# Patient Record
Sex: Female | Born: 1935 | Race: White | Hispanic: No | Smoking: Never smoker
Health system: Southern US, Community
[De-identification: ages and names within clinical notes are randomized; demographics above are authoritative.]

## PROBLEM LIST (undated history)

## (undated) DIAGNOSIS — R002 Palpitations: Secondary | ICD-10-CM

## (undated) DIAGNOSIS — I480 Paroxysmal atrial fibrillation: Secondary | ICD-10-CM

## (undated) DIAGNOSIS — I4891 Unspecified atrial fibrillation: Secondary | ICD-10-CM

## (undated) DIAGNOSIS — K449 Diaphragmatic hernia without obstruction or gangrene: Secondary | ICD-10-CM

## (undated) DIAGNOSIS — Z9289 Personal history of other medical treatment: Secondary | ICD-10-CM

## (undated) DIAGNOSIS — D473 Essential (hemorrhagic) thrombocythemia: Secondary | ICD-10-CM

## (undated) DIAGNOSIS — G4700 Insomnia, unspecified: Secondary | ICD-10-CM

## (undated) DIAGNOSIS — Z8679 Personal history of other diseases of the circulatory system: Secondary | ICD-10-CM

## (undated) DIAGNOSIS — I10 Essential (primary) hypertension: Secondary | ICD-10-CM

## (undated) DIAGNOSIS — Z8742 Personal history of other diseases of the female genital tract: Secondary | ICD-10-CM

## (undated) DIAGNOSIS — D75839 Thrombocytosis, unspecified: Secondary | ICD-10-CM

## (undated) DIAGNOSIS — K649 Unspecified hemorrhoids: Secondary | ICD-10-CM

## (undated) DIAGNOSIS — D496 Neoplasm of unspecified behavior of brain: Secondary | ICD-10-CM

## (undated) HISTORY — DX: Unspecified atrial fibrillation: I48.91

## (undated) HISTORY — PX: CATARACT EXTRACTION: SUR2

## (undated) HISTORY — DX: Thrombocytosis, unspecified: D75.839

## (undated) HISTORY — DX: Personal history of other diseases of the circulatory system: Z86.79

## (undated) HISTORY — DX: Personal history of other diseases of the female genital tract: Z87.42

## (undated) HISTORY — DX: Diaphragmatic hernia without obstruction or gangrene: K44.9

## (undated) HISTORY — DX: Insomnia, unspecified: G47.00

## (undated) HISTORY — DX: Palpitations: R00.2

## (undated) HISTORY — DX: Neoplasm of unspecified behavior of brain: D49.6

## (undated) HISTORY — DX: Essential (hemorrhagic) thrombocythemia: D47.3

## (undated) HISTORY — DX: Unspecified hemorrhoids: K64.9

## (undated) HISTORY — DX: Essential (primary) hypertension: I10

---
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
## 1968-12-14 HISTORY — PX: OTHER SURGICAL HISTORY: SHX169

## 2006-05-16 ENCOUNTER — Other Ambulatory Visit: Payer: Self-pay

## 2006-05-16 ENCOUNTER — Emergency Department: Payer: Self-pay | Admitting: Emergency Medicine

## 2007-02-01 ENCOUNTER — Ambulatory Visit: Payer: Self-pay | Admitting: Family Medicine

## 2008-07-17 ENCOUNTER — Ambulatory Visit: Payer: Self-pay | Admitting: Family Medicine

## 2008-08-09 ENCOUNTER — Ambulatory Visit: Payer: Self-pay | Admitting: Unknown Physician Specialty

## 2008-08-28 ENCOUNTER — Ambulatory Visit: Payer: Self-pay | Admitting: Family Medicine

## 2009-04-02 ENCOUNTER — Ambulatory Visit: Payer: Self-pay | Admitting: Ophthalmology

## 2009-04-15 ENCOUNTER — Ambulatory Visit: Payer: Self-pay | Admitting: Ophthalmology

## 2011-10-07 ENCOUNTER — Ambulatory Visit: Payer: Self-pay | Admitting: Ophthalmology

## 2011-10-13 ENCOUNTER — Ambulatory Visit: Payer: Self-pay | Admitting: Ophthalmology

## 2012-03-08 ENCOUNTER — Encounter: Payer: Self-pay | Admitting: *Deleted

## 2012-03-08 ENCOUNTER — Ambulatory Visit (INDEPENDENT_AMBULATORY_CARE_PROVIDER_SITE_OTHER): Payer: Medicare Other | Admitting: Cardiovascular Disease

## 2012-03-08 VITALS — BP 129/78 | HR 76 | Ht 64.5 in | Wt 141.0 lb

## 2012-03-08 DIAGNOSIS — I471 Supraventricular tachycardia: Secondary | ICD-10-CM

## 2012-03-08 DIAGNOSIS — I10 Essential (primary) hypertension: Secondary | ICD-10-CM | POA: Insufficient documentation

## 2012-03-08 DIAGNOSIS — I4949 Other premature depolarization: Secondary | ICD-10-CM

## 2012-03-08 DIAGNOSIS — I493 Ventricular premature depolarization: Secondary | ICD-10-CM | POA: Insufficient documentation

## 2012-03-08 MED ORDER — NEBIVOLOL HCL 5 MG PO TABS: 5.0000 mg | ORAL_TABLET | Freq: Every day | ORAL | Status: DC

## 2012-03-08 NOTE — Assessment & Plan Note (Signed)
Blood pressure is well-controlled on today's visit. We will start bystolic for palpitations and SVT.

## 2012-03-08 NOTE — Progress Notes (Signed)
Patient ID: Shelly Padilla, female    DOB: Mar 29, 1920, 78 y.o.   MRN: [id number]  HPI Comments: 78 year old female with history of hypertension , insomnia , SVT, APCs and PVCs that are symptomatic with previous trial of diltiazem, metoprolol and more recently, amiodarone who presents for second opinion.  She reports that diltiazem and metoprolol did not seem to work for her. She was started on a meal around 100 mg twice a day with improvement of her palpitations. She read the side effect profile and started having problems with her eyes, hearing, memory as well as weight loss. She attributed everything to the medication. This was decreased to 100 mg daily and eventually 100 mg every other day. Symptoms persisted and she was told to stop the medication. She now has recurrent symptoms, particularly in the nighttime. She is wondering if there are other medications she can try.   She takes care of her husband who has Alzheimer's. There is significant stress. She does not exercise like she should. She has had weight loss which she attributes to taking care of him and eating less. Otherwise she has no other complaints. She denies any shortness of breath or chest pain Holter monitor November 2011 showed paroxysmal SVT.  EKG shows normal sinus rhythm with right bundle branch block, left anterior fascicular block, rate 77 beats per minute    Outpatient Encounter Prescriptions as of 03/08/2012  Medication Sig Dispense Refill  . cholecalciferol (VITAMIN D) 400 UNITS TABS Take 400 Units by mouth daily.      . Multiple Minerals-Vitamins (CALCIUM-MAGNESIUM-ZINC) TABS Take by mouth daily.      . Multiple Vitamins-Minerals (CENTRUM SILVER PO) Take by mouth daily.      . ramipril (ALTACE) 5 MG capsule Take 1 tablet by mouth Daily.      . timolol (TIMOPTIC) 0.5 % ophthalmic solution as directed.      . zolpidem (AMBIEN) 5 MG tablet At bedtime as needed.      . nebivolol (BYSTOLIC) 5 MG tablet Take 1 tablet (5 mg  total) by mouth daily.  14 tablet  0    Review of Systems  Constitutional: Negative.   HENT: Negative.   Eyes: Negative.   Respiratory: Negative.   Cardiovascular: Negative.   Gastrointestinal: Negative.   Musculoskeletal: Negative.   Skin: Negative.   Neurological: Negative.   Hematological: Negative.   Psychiatric/Behavioral: Negative.   All other systems reviewed and are negative.    BP 129/78  Pulse 76  Ht 5' 4.5" (1.638 m)  Wt 141 lb (63.957 kg)  BMI 23.83 kg/m2  Physical Exam  Nursing note and vitals reviewed. Constitutional: She is oriented to person, place, and time. She appears well-developed and well-nourished.  HENT:  Head: Normocephalic.  Nose: Nose normal.  Mouth/Throat: Oropharynx is clear and moist.  Eyes: Conjunctivae are normal. Pupils are equal, round, and reactive to light.  Neck: Normal range of motion. Neck supple. No JVD present.  Cardiovascular: Normal rate, regular rhythm, S1 normal, S2 normal, normal heart sounds and intact distal pulses.  Exam reveals no gallop and no friction rub.   No murmur heard. Pulmonary/Chest: Effort normal and breath sounds normal. No respiratory distress. She has no wheezes. She has no rales. She exhibits no tenderness.  Abdominal: Soft. Bowel sounds are normal. She exhibits no distension. There is no tenderness.  Musculoskeletal: Normal range of motion. She exhibits no edema and no tenderness.  Lymphadenopathy:    She has no cervical adenopathy.  Neurological: She  is alert and oriented to person, place, and time. Coordination normal.  Skin: Skin is warm and dry. No rash noted. No erythema.  Psychiatric: She has a normal mood and affect. Her behavior is normal. Judgment and thought content normal.         Assessment and Plan

## 2012-03-08 NOTE — Assessment & Plan Note (Signed)
Previous EKG did suggest she had APCs and PVCs. Hopefully we can control these as well medical management

## 2012-03-08 NOTE — Assessment & Plan Note (Signed)
History of SVT. Now with symptoms in the evenings. Currently not taking any antiarrhythmic medication. She has tried calcium channel blocker, metoprolol, amiodarone. We will try to pick a medication with minimal side effect profile and will start bystolic 5 mg in the evening at dinner has most of her symptoms are when she goes to bed. We have given her a coupon for two-week trial and asked her to call us in the office if she would like to continue this medication when he samples were almost done. The dose could be titrated upwards to 10 mg if needed.

## 2012-03-08 NOTE — Patient Instructions (Signed)
You are doing well. Please take bystolic 5 mg at dinner for fast rhythm.   Please call us if you have new issues that need to be addressed before your next appt.  Your physician wants you to follow-up in: 1 months.  You will receive a reminder letter in the mail two months in advance. If you don't receive a letter, please call our office to schedule the follow-up appointment.

## 2012-04-11 ENCOUNTER — Telehealth: Payer: Self-pay | Admitting: Cardiovascular Disease

## 2012-04-11 NOTE — Telephone Encounter (Signed)
Pt calling states that she has been experiencing some chest pressure that comes and goes throughout the day and last night seemed stronger. Pt is concerned that it may be here bystolic since she states that is a side effect.

## 2012-04-11 NOTE — Telephone Encounter (Signed)
Uncertain if bystolic is causing her symptoms but we certainly could try taking the medication at a different time of the day, or she could try cutting the dose in half. She may do better with a half twice a day. She actually does want a low heart rate to avoid having her palpitations and ectopy. She could try a half dose, if she feels well, stay on the half. If she starts to have palpitations again, she could try a half dose twice a day.

## 2012-04-11 NOTE — Telephone Encounter (Signed)
Patient called, stated she has been having heaviness in chest off and on for 1 to 2 weeks.States has noticed since starting on bystolic 5mg  daily. States had a sharp pain in chest last night that concerned her. States pain last 1 min or less.Also feels tired and heart rate is ranging 55 to 60 beats/min.Patient wanting to know if she can decrease bystolic.Fowarded to [employer] for advice.

## 2012-04-12 ENCOUNTER — Telehealth: Payer: Self-pay | Admitting: *Deleted

## 2012-04-12 NOTE — Telephone Encounter (Signed)
Spoke with pt who states she thinks 1/2 tab of bystolic may do the trick--she will start today and has an appoint with dr Mariah Milling next week--in meantime if any problems, please call--pt agrees--nt

## 2012-04-19 ENCOUNTER — Encounter: Payer: Self-pay | Admitting: Cardiovascular Disease

## 2012-04-19 ENCOUNTER — Ambulatory Visit (INDEPENDENT_AMBULATORY_CARE_PROVIDER_SITE_OTHER): Payer: Medicare Other | Admitting: Cardiovascular Disease

## 2012-04-19 VITALS — BP 148/68 | HR 71 | Ht 64.0 in | Wt 142.5 lb

## 2012-04-19 DIAGNOSIS — I4949 Other premature depolarization: Secondary | ICD-10-CM

## 2012-04-19 DIAGNOSIS — I10 Essential (primary) hypertension: Secondary | ICD-10-CM

## 2012-04-19 DIAGNOSIS — I493 Ventricular premature depolarization: Secondary | ICD-10-CM

## 2012-04-19 NOTE — Assessment & Plan Note (Signed)
We have asked her to monitor her blood pressure at home. It is borderline elevated today.

## 2012-04-19 NOTE — Assessment & Plan Note (Signed)
She reports feeling better on low-dose bystolic. She would like to take this as needed only. We have suggested she could try this and if symptoms come back, she could take this on a regular basis. She can try 2.5 up to 5 mg as needed.

## 2012-04-19 NOTE — Progress Notes (Signed)
Patient ID: Shelly Padilla, female    DOB: 04-07-1920, 78 y.o.   MRN: [id number]  HPI Comments: 78 year old female with history of hypertension , insomnia , SVT, APCs and PVCs that are symptomatic with previous trial of diltiazem, metoprolol and more recently, amiodarone who presents for second opinion.   diltiazem and metoprolol did not seem to work for her.   She takes care of her husband who has Alzheimer's. There is significant stress. She does not exercise like she should. On her last clinic visit we started low dose bystolic. The 5 mg she felt was too much and cause bradycardia. She has cut the dose in half and takes his other regular basis. She denies any regular tachycardia or palpitations on the bystolic. She would like to take the medication as needed Otherwise she has no other complaints. She denies any shortness of breath or chest pain  Holter monitor November 2011 showed paroxysmal SVT.    Outpatient Encounter Prescriptions as of 04/19/2012  Medication Sig Dispense Refill  . aspirin 81 MG tablet Take 81 mg by mouth daily.      . cholecalciferol (VITAMIN D) 400 UNITS TABS Take 400 Units by mouth daily.      . Multiple Minerals-Vitamins (CALCIUM-MAGNESIUM-ZINC) TABS Take by mouth daily.      . Multiple Vitamins-Minerals (CENTRUM SILVER PO) Take by mouth daily.      . nebivolol (BYSTOLIC) 2.5 MG tablet Take 2.5 mg by mouth daily.      . ramipril (ALTACE) 5 MG capsule Take 1 tablet by mouth Daily.      . timolol (TIMOPTIC) 0.5 % ophthalmic solution as directed.      . zolpidem (AMBIEN) 5 MG tablet At bedtime as needed.        Review of Systems  Constitutional: Negative.   HENT: Negative.   Eyes: Negative.   Respiratory: Negative.   Cardiovascular: Negative.   Gastrointestinal: Negative.   Musculoskeletal: Negative.   Skin: Negative.   Neurological: Negative.   Hematological: Negative.   Psychiatric/Behavioral: Negative.   All other systems reviewed and are  negative.    BP 148/68  Pulse 71  Ht 5\' 4"  (1.626 m)  Wt 142 lb 8 oz (64.638 kg)  BMI 24.46 kg/m2  Physical Exam  Nursing note and vitals reviewed. Constitutional: She is oriented to person, place, and time. She appears well-developed and well-nourished.  HENT:  Head: Normocephalic.  Nose: Nose normal.  Mouth/Throat: Oropharynx is clear and moist.  Eyes: Conjunctivae are normal. Pupils are equal, round, and reactive to light.  Neck: Normal range of motion. Neck supple. No JVD present.  Cardiovascular: Normal rate, regular rhythm, S1 normal, S2 normal, normal heart sounds and intact distal pulses.  Exam reveals no gallop and no friction rub.   No murmur heard. Pulmonary/Chest: Effort normal and breath sounds normal. No respiratory distress. She has no wheezes. She has no rales. She exhibits no tenderness.  Abdominal: Soft. Bowel sounds are normal. She exhibits no distension. There is no tenderness.  Musculoskeletal: Normal range of motion. She exhibits no edema and no tenderness.  Lymphadenopathy:    She has no cervical adenopathy.  Neurological: She is alert and oriented to person, place, and time. Coordination normal.  Skin: Skin is warm and dry. No rash noted. No erythema.  Psychiatric: She has a normal mood and affect. Her behavior is normal. Judgment and thought content normal.         Assessment and Plan

## 2012-04-19 NOTE — Patient Instructions (Addendum)
You are doing well. Ok to take bystolic 1/2 pill (2.5 mg) as needed for tachycardia or palpitations  Please call us if you have new issues that need to be addressed before your next appt.  Your physician wants you to follow-up in: 12 months.  You will receive a reminder letter in the mail two months in advance. If you don't receive a letter, please call our office to schedule the follow-up appointment.

## 2012-09-02 ENCOUNTER — Telehealth: Payer: Self-pay | Admitting: Cardiovascular Disease

## 2012-09-02 NOTE — Telephone Encounter (Signed)
Pt called to make and appt states that she is not sure if her bystolic is the right medication for her. She says that when she takes it and lays down that it makes her heart POUND but she is fine when she is standing.

## 2012-09-02 NOTE — Telephone Encounter (Signed)
FYI

## 2012-09-02 NOTE — Telephone Encounter (Signed)
Pt called to schedule an appt and states that she thinks her bystolic is not the medication for her. She says that when she takes it and lays down that it makes her heart POUND but she is fine when she is standing.

## 2012-09-02 NOTE — Telephone Encounter (Signed)
Pt asks if ok to continue taking just 1/2 tablet bystolic PRN tachycardia/palpitations. I advised, per Dr. Windell Hummingbird ;ast note, ok to take it this way. She reassures me this is controlling her palpitations/tachycardia well.  She mentions a change in her eyesight over the past few weeks and wonders if this could be attributed to the Bystolic. I reassured her this usually is not a SE and should f/u with PCP. Understanding verb. Says BP has been well controlled as well (130/70). Will keep appt with Dr. Mariah Milling for November and will call us should she need to be seen sooner.

## 2012-10-18 ENCOUNTER — Encounter: Payer: Self-pay | Admitting: Cardiovascular Disease

## 2012-10-18 ENCOUNTER — Ambulatory Visit (INDEPENDENT_AMBULATORY_CARE_PROVIDER_SITE_OTHER): Payer: Medicare Other | Admitting: Cardiovascular Disease

## 2012-10-18 VITALS — BP 152/80 | HR 75 | Ht 64.5 in | Wt 139.5 lb

## 2012-10-18 DIAGNOSIS — F39 Unspecified mood [affective] disorder: Secondary | ICD-10-CM | POA: Insufficient documentation

## 2012-10-18 DIAGNOSIS — R002 Palpitations: Secondary | ICD-10-CM

## 2012-10-18 DIAGNOSIS — I10 Essential (primary) hypertension: Secondary | ICD-10-CM

## 2012-10-18 DIAGNOSIS — R0602 Shortness of breath: Secondary | ICD-10-CM

## 2012-10-18 DIAGNOSIS — I471 Supraventricular tachycardia: Secondary | ICD-10-CM

## 2012-10-18 DIAGNOSIS — F4320 Adjustment disorder, unspecified: Secondary | ICD-10-CM

## 2012-10-18 NOTE — Progress Notes (Signed)
Patient ID: Shelly Padilla, female    DOB: 01-01-1920, 78 y.o.   MRN: [id number]  HPI Comments: 78 year old female with history of hypertension , insomnia , SVT, APCs and PVCs that are symptomatic with previous trial of diltiazem, metoprolol and more recently, amiodarone who presents for routine followup.   diltiazem and metoprolol did not seem to work for her.   She takes care of her husband who has Alzheimer's. There is significant stress.  She reports that she is taking bystolic  sporadically and continues to have occasional palpitations.  overall she feels well with no significant complaints. She did have a tooth pulled recently and since she started oxycodone for pain relief, she has had vision and hearing problems.  She denies any shortness of breath or chest pain   Holter monitor November 2011 showed paroxysmal SVT.  EKG today shows normal sinus rhythm with rate 75 beats per minute, right bundle branch block, left axis deviation    Outpatient Encounter Prescriptions as of 10/18/2012  Medication Sig Dispense Refill  . aspirin 81 MG tablet Take 81 mg by mouth daily.      . cholecalciferol (VITAMIN D) 400 UNITS TABS Take 400 Units by mouth daily.      . Multiple Minerals-Vitamins (CALCIUM-MAGNESIUM-ZINC) TABS Take by mouth daily.      . Multiple Vitamins-Minerals (CENTRUM SILVER PO) Take by mouth daily.      . nebivolol (BYSTOLIC) 5 MG tablet Take 5 mg by mouth every other day.      . ramipril (ALTACE) 5 MG capsule Take 1 tablet by mouth Daily.      . timolol (TIMOPTIC) 0.5 % ophthalmic solution as directed.      . zolpidem (AMBIEN) 5 MG tablet At bedtime as needed.      . [DISCONTINUED] nebivolol (BYSTOLIC) 2.5 MG tablet Take 2.5 mg by mouth daily.        Review of Systems  Constitutional: Negative.   HENT: Negative.   Eyes: Negative.   Respiratory: Negative.   Cardiovascular: Negative.   Gastrointestinal: Negative.   Musculoskeletal: Negative.   Skin: Negative.     Neurological: Negative.   Hematological: Negative.   Psychiatric/Behavioral: Negative.   All other systems reviewed and are negative.    BP 152/80  Pulse 75  Ht 5' 4.5" (1.638 m)  Wt 139 lb 8 oz (63.277 kg)  BMI 23.58 kg/m2  Physical Exam  Nursing note and vitals reviewed. Constitutional: She is oriented to person, place, and time. She appears well-developed and well-nourished.  HENT:  Head: Normocephalic.  Nose: Nose normal.  Mouth/Throat: Oropharynx is clear and moist.  Eyes: Conjunctivae normal are normal. Pupils are equal, round, and reactive to light.  Neck: Normal range of motion. Neck supple. No JVD present.  Cardiovascular: Normal rate, regular rhythm, S1 normal, S2 normal, normal heart sounds and intact distal pulses.  Exam reveals no gallop and no friction rub.   No murmur heard. Pulmonary/Chest: Effort normal and breath sounds normal. No respiratory distress. She has no wheezes. She has no rales. She exhibits no tenderness.  Abdominal: Soft. Bowel sounds are normal. She exhibits no distension. There is no tenderness.  Musculoskeletal: Normal range of motion. She exhibits no edema and no tenderness.  Lymphadenopathy:    She has no cervical adenopathy.  Neurological: She is alert and oriented to person, place, and time. Coordination normal.  Skin: Skin is warm and dry. No rash noted. No erythema.  Psychiatric: She has a normal mood and affect.  Her behavior is normal. Judgment and thought content normal.         Assessment and Plan

## 2012-10-18 NOTE — Assessment & Plan Note (Signed)
Blood pressure is well controlled on today's visit. No changes made to the medications. 

## 2012-10-18 NOTE — Patient Instructions (Addendum)
You are doing well. Please take bystolic 1/2 pill every night Take an extra 1/2 dose if you have more palpitations  Please call us if you have new issues that need to be addressed before your next appt.  Your physician wants you to follow-up in: 6 months.  You will receive a reminder letter in the mail two months in advance. If you don't receive a letter, please call our office to schedule the follow-up appointment.

## 2012-10-18 NOTE — Assessment & Plan Note (Signed)
She continues to have occasional palpitations. We have suggested she stay on bystolic 2.5 mg daily for 5 mg every other day. If symptoms get worse, she could take 5 mg daily. She did not have any significant symptoms on this low-dose beta blocker and it seemed to improve her symptoms. Overall she feels well.

## 2012-10-18 NOTE — Assessment & Plan Note (Signed)
She does have significant stress taking care of her husband. Her work level has been slowly increasing.

## 2012-11-25 ENCOUNTER — Telehealth: Payer: Self-pay | Admitting: Cardiovascular Disease

## 2012-11-25 NOTE — Telephone Encounter (Signed)
LMTCB

## 2012-11-25 NOTE — Telephone Encounter (Signed)
See below and advise thanks 

## 2012-11-25 NOTE — Telephone Encounter (Signed)
Pt says she was having frequent palpitations and dizziness at night, 1.5 hours after taking 1/2 bystolic as prescribed. She felt palpitations were being made worse by the bystolic so she stopped the med She since is feeling better and denies further palpitations or dizziness and just wanted [place] to know Asks if there is another med she should try I told her I would check with Dr. Mariah Milling and call her back  Understanding verb

## 2012-11-25 NOTE — Telephone Encounter (Signed)
Pt calling states she is having palps and wants to speak to nurse

## 2012-11-27 NOTE — Telephone Encounter (Signed)
She has tried metoprolol, diltiazem, amiodarone, now bystolic. Could try propranolol 10 mg prn or verapamil short acting dose if she would like. If no palps, no meds needed

## 2012-11-28 ENCOUNTER — Other Ambulatory Visit: Payer: Self-pay

## 2012-11-28 MED ORDER — PROPRANOLOL HCL 10 MG PO TABS: 10.0000 mg | ORAL_TABLET | ORAL | Status: DC | PRN

## 2012-11-28 NOTE — Telephone Encounter (Signed)
Pt informed She wishes to try propanalol 10 mg PO PRN palpitations New RX sent to [facility]

## 2013-04-17 ENCOUNTER — Encounter: Payer: Self-pay | Admitting: *Deleted

## 2013-04-20 ENCOUNTER — Ambulatory Visit: Payer: PRIVATE HEALTH INSURANCE | Admitting: Cardiovascular Disease

## 2013-04-21 ENCOUNTER — Ambulatory Visit (INDEPENDENT_AMBULATORY_CARE_PROVIDER_SITE_OTHER): Payer: Medicare Other | Admitting: Cardiovascular Disease

## 2013-04-21 ENCOUNTER — Encounter: Payer: Self-pay | Admitting: Cardiovascular Disease

## 2013-04-21 VITALS — BP 140/72 | HR 75 | Ht 64.0 in | Wt 139.2 lb

## 2013-04-21 DIAGNOSIS — I471 Supraventricular tachycardia, unspecified: Secondary | ICD-10-CM

## 2013-04-21 DIAGNOSIS — I493 Ventricular premature depolarization: Secondary | ICD-10-CM

## 2013-04-21 DIAGNOSIS — I10 Essential (primary) hypertension: Secondary | ICD-10-CM

## 2013-04-21 DIAGNOSIS — I4949 Other premature depolarization: Secondary | ICD-10-CM

## 2013-04-21 MED ORDER — NEBIVOLOL HCL 5 MG PO TABS: 5.0000 mg | ORAL_TABLET | Freq: Every day | ORAL | Status: DC

## 2013-04-21 MED ORDER — PROPRANOLOL HCL 20 MG PO TABS: 20.0000 mg | ORAL_TABLET | Freq: Three times a day (TID) | ORAL | Status: DC | PRN

## 2013-04-21 NOTE — Progress Notes (Signed)
Patient ID: Shelly Padilla, female    DOB: [birth year], 79 y.o.   MRN: [id number]  HPI Comments: 79 year old female with history of hypertension , insomnia , SVT, APCs and PVCs that are symptomatic with previous trial of diltiazem, metoprolol, amiodarone who presents for routine followup.  She continues to have occasional episodes of tachycardia. These are not frequent, rare, typically at nighttime. She called sometime back though she does not know when and was told to hold her by bystolic. Details are unavailable. There was an episode in December with palpitations. Various medications do not seem to work for her. She does seem to tolerate bystolic one half dose daily at nighttime. She was given propranolol to take for breakthrough palpitations but did not pick this up from the pharmacy.   She takes care of her husband who has Alzheimer's. There is significant stress.  she has had vision and hearing problems. She reports problems after taking antibiotics following a dental procedure  She denies any shortness of breath or chest pain   Holter monitor November 2011 showed paroxysmal SVT.  EKG today shows normal sinus rhythm with rate 75 beats per minute, right bundle branch block, left axis deviation    Outpatient Encounter Prescriptions as of 04/21/2013  Medication Sig Dispense Refill  . aspirin 81 MG tablet Take 81 mg by mouth daily.      . cholecalciferol (VITAMIN D) 400 UNITS TABS Take 400 Units by mouth daily.      . Multiple Minerals-Vitamins (CALCIUM-MAGNESIUM-ZINC) TABS Take by mouth daily.      . Multiple Vitamins-Minerals (CENTRUM SILVER PO) Take by mouth daily.      . ramipril (ALTACE) 5 MG capsule Take 1 tablet by mouth Daily.      . timolol (TIMOPTIC) 0.5 % ophthalmic solution as directed.      . zolpidem (AMBIEN) 5 MG tablet At bedtime as needed.      . [DISCONTINUED] nebivolol (BYSTOLIC) 5 MG tablet Take 5 mg by mouth every other day.      . [DISCONTINUED] propranolol  (INDERAL) 10 MG tablet Take 1 tablet (10 mg total) by mouth as needed.  30 tablet  3   No facility-administered encounter medications on file as of 04/21/2013.    Review of Systems  Constitutional: Negative.   HENT: Negative.   Eyes: Negative.   Respiratory: Negative.   Cardiovascular: Negative.   Gastrointestinal: Negative.   Musculoskeletal: Negative.   Skin: Negative.   Neurological: Negative.   Psychiatric/Behavioral: Negative.   All other systems reviewed and are negative.    BP 140/72  Pulse 75  Ht 5\' 4"  (1.626 m)  Wt 139 lb 4 oz (63.163 kg)  BMI 23.89 kg/m2  Physical Exam  Nursing note and vitals reviewed. Constitutional: She is oriented to person, place, and time. She appears well-developed and well-nourished.  HENT:  Head: Normocephalic.  Nose: Nose normal.  Mouth/Throat: Oropharynx is clear and moist.  Eyes: Conjunctivae are normal. Pupils are equal, round, and reactive to light.  Neck: Normal range of motion. Neck supple. No JVD present.  Cardiovascular: Normal rate, regular rhythm, S1 normal, S2 normal, normal heart sounds and intact distal pulses.  Exam reveals no gallop and no friction rub.   No murmur heard. Pulmonary/Chest: Effort normal and breath sounds normal. No respiratory distress. She has no wheezes. She has no rales. She exhibits no tenderness.  Abdominal: Soft. Bowel sounds are normal. She exhibits no distension. There is no tenderness.  Musculoskeletal: Normal range of motion.  She exhibits no edema and no tenderness.  Lymphadenopathy:    She has no cervical adenopathy.  Neurological: She is alert and oriented to person, place, and time. Coordination normal.  Skin: Skin is warm and dry. No rash noted. No erythema.  Psychiatric: She has a normal mood and affect. Her behavior is normal. Judgment and thought content normal.    Assessment and Plan

## 2013-04-21 NOTE — Patient Instructions (Addendum)
You are doing well. Please start 1/2 bystolic at dinner daily  For breakthrough tachycardia, take propranolol pill as needed  Please call us if you have new issues that need to be addressed before your next appt.  Your physician wants you to follow-up in: 6 months.  You will receive a reminder letter in the mail two months in advance. If you don't receive a letter, please call our office to schedule the follow-up appointment.

## 2013-04-21 NOTE — Assessment & Plan Note (Signed)
Still having periodic PVCs and palpitations, occasional runs of SVT. We have suggested she continue on bystolic 2.5 mg daily, and take propranolol as needed for breakthrough palpitations or tachycardia episodes

## 2013-04-21 NOTE — Assessment & Plan Note (Signed)
Rare episodes. She will take propranolol as needed for tachycardia episodes.

## 2013-04-21 NOTE — Assessment & Plan Note (Signed)
She will stay on Bystolic 2.5 mg daily

## 2013-06-15 ENCOUNTER — Other Ambulatory Visit: Payer: Self-pay

## 2013-06-15 ENCOUNTER — Telehealth: Payer: Self-pay

## 2013-06-15 NOTE — Telephone Encounter (Signed)
Pt reports high BP (180 mmhg SBP) at last OV with Dr. Burnett Sheng Was instructed by Dr. Burnett Sheng to increase Bystolic to 5 mg daily (from 2.5 mg daily) She asks if ok to do this  I advised, if BP still running high, ok to do this I advised she monitor BPs and let us know if they start to run too low She lives at [facility] and will have staff do this I will make Dr. Mariah Milling aware of change Will try to get last Office not from Dr. Burnett Sheng as well

## 2013-06-15 NOTE — Telephone Encounter (Signed)
FYI

## 2013-06-15 NOTE — Telephone Encounter (Signed)
Pt had appt annual appt with PCP, BP was elevated, wants pt to increase Bystolic from 2.5 mg to 5 mg, but wanted to check with dr Mariah Milling before doing this. Please advise.

## 2013-07-17 ENCOUNTER — Telehealth: Payer: Self-pay | Admitting: *Deleted

## 2013-07-17 NOTE — Telephone Encounter (Signed)
Bystolic 5 mg patient is requesting samples

## 2013-07-17 NOTE — Telephone Encounter (Signed)
Lmtcb; samples placed at front desk.

## 2013-08-08 ENCOUNTER — Telehealth: Payer: Self-pay

## 2013-08-08 NOTE — Telephone Encounter (Signed)
Placed samples of Bystolic 5 mg. Pt will come in to pick up.

## 2013-08-08 NOTE — Telephone Encounter (Signed)
Pt needs Bystolic 5 mg samples. Please call

## 2013-09-04 ENCOUNTER — Other Ambulatory Visit: Payer: Self-pay

## 2013-09-04 MED ORDER — NEBIVOLOL HCL 5 MG PO TABS: 5.0000 mg | ORAL_TABLET | Freq: Every day | ORAL | Status: DC

## 2013-10-04 ENCOUNTER — Telehealth: Payer: Self-pay

## 2013-10-04 NOTE — Telephone Encounter (Signed)
Pt needs Bystolic samples. Please call

## 2013-10-04 NOTE — Telephone Encounter (Signed)
Pt aware samples of bystolic 5 mg at front desk for pick up.

## 2013-10-23 ENCOUNTER — Ambulatory Visit (INDEPENDENT_AMBULATORY_CARE_PROVIDER_SITE_OTHER): Payer: Medicare Other | Admitting: Cardiovascular Disease

## 2013-10-23 ENCOUNTER — Encounter: Payer: Self-pay | Admitting: Cardiovascular Disease

## 2013-10-23 ENCOUNTER — Encounter (INDEPENDENT_AMBULATORY_CARE_PROVIDER_SITE_OTHER): Payer: Self-pay

## 2013-10-23 VITALS — BP 158/90 | HR 71 | Ht 64.0 in | Wt 138.5 lb

## 2013-10-23 DIAGNOSIS — I4949 Other premature depolarization: Secondary | ICD-10-CM

## 2013-10-23 DIAGNOSIS — I471 Supraventricular tachycardia, unspecified: Secondary | ICD-10-CM

## 2013-10-23 DIAGNOSIS — I10 Essential (primary) hypertension: Secondary | ICD-10-CM

## 2013-10-23 DIAGNOSIS — R002 Palpitations: Secondary | ICD-10-CM

## 2013-10-23 DIAGNOSIS — I493 Ventricular premature depolarization: Secondary | ICD-10-CM

## 2013-10-23 NOTE — Assessment & Plan Note (Signed)
Rare episodes. Symptoms improved on low-dose beta blocker.

## 2013-10-23 NOTE — Assessment & Plan Note (Signed)
Blood pressure elevated today. She is very stressed out after taking care of her husband today. We have suggested she closely monitor her blood pressure at home. If he runs elevated consistently, she could take bystolic 5 mg twice a day

## 2013-10-23 NOTE — Patient Instructions (Signed)
You are doing well. No medication changes were made.  For constipation, stomach pains, try miralex, and or milk of magnesia For upper stomach pain, acid, try omeprazole one a day  Please call us if you have new issues that need to be addressed before your next appt.  Your physician wants you to follow-up in: 6 months.  You will receive a reminder letter in the mail two months in advance. If you don't receive a letter, please call our office to schedule the follow-up appointment.

## 2013-10-23 NOTE — Progress Notes (Signed)
   Patient ID: Shelly Padilla, female    DOB: [birth year], 79 y.o.   MRN: [id number]  HPI Comments: 79 year old female with history of hypertension , insomnia , SVT, APCs and PVCs that are symptomatic with previous trial of diltiazem, metoprolol, amiodarone who presents for routine followup.  In general she reports that she is well. She takes bystolic 5 mg every night and this seems to control her palpitation episodes. Rarely does she have symptomatic episodes.  Symptoms typically at nighttime    She takes care of her husband who has Alzheimer's. There is significant stress. he goes to the memory daycare 3 times per week . She's not doing a regular exercise program  she has had vision and hearing problems.   She denies any shortness of breath or chest pain   Holter monitor November 2011 showed paroxysmal SVT. Recent lab work showing creatinine 0.6, hematocrit 42   EKG today shows normal sinus rhythm with rate 71 beats per minute, right bundle branch block, left axis deviation    Outpatient Encounter Prescriptions as of 10/23/2013  Medication Sig  . aspirin 81 MG tablet Take 81 mg by mouth daily.  . cholecalciferol (VITAMIN D) 400 UNITS TABS Take 400 Units by mouth daily.  . Multiple Minerals-Vitamins (CALCIUM-MAGNESIUM-ZINC) TABS Take by mouth daily.  . Multiple Vitamins-Minerals (CENTRUM SILVER PO) Take by mouth daily.  . nebivolol (BYSTOLIC) 5 MG tablet Take 1 tablet (5 mg total) by mouth daily.  . propranolol (INDERAL) 20 MG tablet Take 1 tablet (20 mg total) by mouth 3 (three) times daily as needed.  . ramipril (ALTACE) 5 MG capsule Take 1 tablet by mouth Daily.  . timolol (TIMOPTIC) 0.5 % ophthalmic solution as directed.  . zolpidem (AMBIEN) 5 MG tablet At bedtime as needed.    Review of Systems  Constitutional: Negative.   HENT: Negative.   Eyes: Negative.   Respiratory: Negative.   Cardiovascular: Positive for palpitations.  Gastrointestinal: Negative.   Musculoskeletal:  Negative.   Skin: Negative.   Neurological: Negative.   Psychiatric/Behavioral: Negative.   All other systems reviewed and are negative.    BP 158/90  Pulse 71  Ht 5\' 4"  (1.626 m)  Wt 138 lb 8 oz (62.823 kg)  BMI 23.76 kg/m2  Physical Exam  Nursing note and vitals reviewed. Constitutional: She is oriented to person, place, and time. She appears well-developed and well-nourished.  HENT:  Head: Normocephalic.  Nose: Nose normal.  Mouth/Throat: Oropharynx is clear and moist.  Eyes: Conjunctivae are normal. Pupils are equal, round, and reactive to light.  Neck: Normal range of motion. Neck supple. No JVD present.  Cardiovascular: Normal rate, regular rhythm, S1 normal, S2 normal, normal heart sounds and intact distal pulses.  Exam reveals no gallop and no friction rub.   No murmur heard. Pulmonary/Chest: Effort normal and breath sounds normal. No respiratory distress. She has no wheezes. She has no rales. She exhibits no tenderness.  Abdominal: Soft. Bowel sounds are normal. She exhibits no distension. There is no tenderness.  Musculoskeletal: Normal range of motion. She exhibits no edema and no tenderness.  Lymphadenopathy:    She has no cervical adenopathy.  Neurological: She is alert and oriented to person, place, and time. Coordination normal.  Skin: Skin is warm and dry. No rash noted. No erythema.  Psychiatric: She has a normal mood and affect. Her behavior is normal. Judgment and thought content normal.    Assessment and Plan

## 2013-10-23 NOTE — Assessment & Plan Note (Signed)
She any episodes of tachycardia. No medication changes at this time

## 2013-10-27 ENCOUNTER — Ambulatory Visit: Payer: Self-pay | Admitting: Gastroenterology

## 2013-11-13 ENCOUNTER — Ambulatory Visit: Payer: Self-pay | Admitting: Gastroenterology

## 2013-11-24 ENCOUNTER — Ambulatory Visit: Payer: Self-pay | Admitting: Gastroenterology

## 2013-11-28 ENCOUNTER — Telehealth: Payer: Self-pay | Admitting: Gastroenterology

## 2013-11-28 NOTE — Telephone Encounter (Signed)
Rec'd from [facility] forward 3 pages to Dr. Christella Hartigan

## 2014-01-15 DIAGNOSIS — H903 Sensorineural hearing loss, bilateral: Secondary | ICD-10-CM | POA: Diagnosis not present

## 2014-01-15 DIAGNOSIS — H60509 Unspecified acute noninfective otitis externa, unspecified ear: Secondary | ICD-10-CM | POA: Diagnosis not present

## 2014-01-15 DIAGNOSIS — H9319 Tinnitus, unspecified ear: Secondary | ICD-10-CM | POA: Diagnosis not present

## 2014-01-15 DIAGNOSIS — H6120 Impacted cerumen, unspecified ear: Secondary | ICD-10-CM | POA: Diagnosis not present

## 2014-02-05 ENCOUNTER — Telehealth: Payer: Self-pay

## 2014-02-05 NOTE — Telephone Encounter (Signed)
Placed samples of Bystolic 5 mg upfront for pick up.

## 2014-02-05 NOTE — Telephone Encounter (Signed)
Pt needs bystolic samples.

## 2014-03-05 DIAGNOSIS — R1031 Right lower quadrant pain: Secondary | ICD-10-CM | POA: Diagnosis not present

## 2014-03-05 DIAGNOSIS — R1032 Left lower quadrant pain: Secondary | ICD-10-CM | POA: Diagnosis not present

## 2014-03-09 ENCOUNTER — Other Ambulatory Visit: Payer: Self-pay

## 2014-03-09 MED ORDER — NEBIVOLOL HCL 5 MG PO TABS: 5.0000 mg | ORAL_TABLET | Freq: Every day | ORAL | Status: DC

## 2014-03-19 ENCOUNTER — Ambulatory Visit: Payer: Self-pay | Admitting: Gastroenterology

## 2014-03-19 DIAGNOSIS — R1012 Left upper quadrant pain: Secondary | ICD-10-CM | POA: Diagnosis not present

## 2014-03-19 DIAGNOSIS — N83209 Unspecified ovarian cyst, unspecified side: Secondary | ICD-10-CM | POA: Diagnosis not present

## 2014-03-19 DIAGNOSIS — D259 Leiomyoma of uterus, unspecified: Secondary | ICD-10-CM | POA: Diagnosis not present

## 2014-03-19 DIAGNOSIS — R1011 Right upper quadrant pain: Secondary | ICD-10-CM | POA: Diagnosis not present

## 2014-03-26 DIAGNOSIS — R1032 Left lower quadrant pain: Secondary | ICD-10-CM | POA: Diagnosis not present

## 2014-03-26 DIAGNOSIS — K5900 Constipation, unspecified: Secondary | ICD-10-CM | POA: Diagnosis not present

## 2014-03-26 DIAGNOSIS — R1031 Right lower quadrant pain: Secondary | ICD-10-CM | POA: Diagnosis not present

## 2014-03-30 DIAGNOSIS — H04129 Dry eye syndrome of unspecified lacrimal gland: Secondary | ICD-10-CM | POA: Diagnosis not present

## 2014-04-17 ENCOUNTER — Telehealth: Payer: Self-pay

## 2014-04-17 NOTE — Telephone Encounter (Signed)
Samples already placed at front desk.

## 2014-04-17 NOTE — Telephone Encounter (Signed)
Pt needs bystolic samples

## 2014-05-10 ENCOUNTER — Telehealth: Payer: Self-pay

## 2014-05-10 NOTE — Telephone Encounter (Signed)
Pt would like bystolic samples. States she needs more than usual due to she does not have a car and cannot get out as frequent

## 2014-05-10 NOTE — Telephone Encounter (Signed)
LMOM samples of Bystolic 5 mg available to pick up.

## 2014-05-18 ENCOUNTER — Ambulatory Visit (INDEPENDENT_AMBULATORY_CARE_PROVIDER_SITE_OTHER): Payer: Medicare Other | Admitting: Cardiovascular Disease

## 2014-05-18 ENCOUNTER — Encounter: Payer: Self-pay | Admitting: Cardiovascular Disease

## 2014-05-18 VITALS — BP 150/80 | HR 70 | Ht 64.0 in | Wt 135.2 lb

## 2014-05-18 DIAGNOSIS — F4320 Adjustment disorder, unspecified: Secondary | ICD-10-CM | POA: Diagnosis not present

## 2014-05-18 DIAGNOSIS — I4949 Other premature depolarization: Secondary | ICD-10-CM

## 2014-05-18 DIAGNOSIS — I10 Essential (primary) hypertension: Secondary | ICD-10-CM

## 2014-05-18 DIAGNOSIS — I471 Supraventricular tachycardia: Secondary | ICD-10-CM

## 2014-05-18 DIAGNOSIS — G4700 Insomnia, unspecified: Secondary | ICD-10-CM

## 2014-05-18 DIAGNOSIS — I493 Ventricular premature depolarization: Secondary | ICD-10-CM

## 2014-05-18 NOTE — Assessment & Plan Note (Signed)
She takes Ambien twice per week. Uncertain if insomnia is from underlying stress. She does seem very anxious on today's visit. She reports having memory problems. Uncertain if this is from her poor sleep. Recommended a regular walking program. Uncertain if she needs referral to neurology for baseline neurologic testing. Will defer to primary care

## 2014-05-18 NOTE — Patient Instructions (Signed)
You are doing well. No medication changes were made.  Please call us if you have new issues that need to be addressed before your next appt.  Your physician wants you to follow-up in: 6 months.  You will receive a reminder letter in the mail two months in advance. If you don't receive a letter, please call our office to schedule the follow-up appointment.   

## 2014-05-18 NOTE — Assessment & Plan Note (Signed)
Blood pressure is well controlled on today's visit. No changes made to the medications. 

## 2014-05-18 NOTE — Assessment & Plan Note (Signed)
She does not report having significant symptoms. We'll continue low-dose beta blocker

## 2014-05-18 NOTE — Assessment & Plan Note (Signed)
Continues to have a difficult time with aging, taking care of her husband. Family lives in [place]

## 2014-05-18 NOTE — Assessment & Plan Note (Signed)
Symptoms well controlled on low-dose beta blocker. No medication changes made

## 2014-05-18 NOTE — Progress Notes (Signed)
Patient ID: Shelly Padilla, female    DOB: 11-14-1920, 81 y.o.   MRN: [id number]  HPI Comments: 80 year old female with history of hypertension , insomnia , SVT, APCs and PVCs that are symptomatic with previous trial of diltiazem, metoprolol, amiodarone who presents for routine followup.   She takes care of her husband who has Alzheimer's. There is significant stress. he goes to the memory daycare 3 times per week . She's not doing a regular exercise program  she has had vision and hearing problems. She wonders if her memory is getting worse. She has anorexia, weight loss, insomnia. She takes Ambien   2 times per week. She denies any shortness of breath or chest pain   She takes bystolic 5 mg every night and this seems to control her palpitation episodes. Rarely does she have symptomatic episodes.   Holter monitor November 2011 showed paroxysmal SVT.  lab work showing creatinine 0.6, hematocrit 42   EKG today shows normal sinus rhythm with rate 70 beats per minute, right bundle branch block, left axis deviation    Outpatient Encounter Prescriptions as of 05/18/2014  Medication Sig  . aspirin 81 MG tablet Take 81 mg by mouth daily.  . cholecalciferol (VITAMIN D) 400 UNITS TABS Take 400 Units by mouth daily.  . Multiple Minerals-Vitamins (CALCIUM-MAGNESIUM-ZINC) TABS Take by mouth daily.  . Multiple Vitamins-Minerals (CENTRUM SILVER PO) Take by mouth daily.  . nebivolol (BYSTOLIC) 5 MG tablet Take 1 tablet (5 mg total) by mouth daily.  . propranolol (INDERAL) 20 MG tablet Take 1 tablet (20 mg total) by mouth 3 (three) times daily as needed.  . ramipril (ALTACE) 5 MG capsule Take 1 tablet by mouth Daily.  . timolol (TIMOPTIC) 0.5 % ophthalmic solution as directed.  . zolpidem (AMBIEN) 5 MG tablet At bedtime as needed.    Review of Systems  Constitutional: Negative.   HENT: Negative.   Eyes: Negative.   Respiratory: Negative.   Cardiovascular: Positive for palpitations.   Gastrointestinal: Negative.   Endocrine: Negative.   Musculoskeletal: Negative.   Skin: Negative.   Allergic/Immunologic: Negative.   Neurological: Negative.   Hematological: Negative.   Psychiatric/Behavioral: Negative.   All other systems reviewed and are negative.   BP 150/80  Pulse 70  Ht 5\' 4"  (1.626 m)  Wt 135 lb 4 oz (61.349 kg)  BMI 23.20 kg/m2  Physical Exam  Nursing note and vitals reviewed. Constitutional: She is oriented to person, place, and time. She appears well-developed and well-nourished.  HENT:  Head: Normocephalic.  Nose: Nose normal.  Mouth/Throat: Oropharynx is clear and moist.  Eyes: Conjunctivae are normal. Pupils are equal, round, and reactive to light.  Neck: Normal range of motion. Neck supple. No JVD present.  Cardiovascular: Normal rate, regular rhythm, S1 normal, S2 normal, normal heart sounds and intact distal pulses.  Exam reveals no gallop and no friction rub.   No murmur heard. Pulmonary/Chest: Effort normal and breath sounds normal. No respiratory distress. She has no wheezes. She has no rales. She exhibits no tenderness.  Abdominal: Soft. Bowel sounds are normal. She exhibits no distension. There is no tenderness.  Musculoskeletal: Normal range of motion. She exhibits no edema and no tenderness.  Lymphadenopathy:    She has no cervical adenopathy.  Neurological: She is alert and oriented to person, place, and time. Coordination normal.  Skin: Skin is warm and dry. No rash noted. No erythema.  Psychiatric: She has a normal mood and affect. Her behavior is normal. Judgment and  thought content normal.    Assessment and Plan

## 2014-05-21 DIAGNOSIS — G4700 Insomnia, unspecified: Secondary | ICD-10-CM | POA: Diagnosis not present

## 2014-05-21 DIAGNOSIS — I10 Essential (primary) hypertension: Secondary | ICD-10-CM | POA: Diagnosis not present

## 2014-07-09 DIAGNOSIS — N390 Urinary tract infection, site not specified: Secondary | ICD-10-CM | POA: Diagnosis not present

## 2014-08-02 ENCOUNTER — Inpatient Hospital Stay: Payer: Self-pay | Admitting: Internal Medicine

## 2014-08-02 DIAGNOSIS — I658 Occlusion and stenosis of other precerebral arteries: Secondary | ICD-10-CM | POA: Diagnosis not present

## 2014-08-02 DIAGNOSIS — H409 Unspecified glaucoma: Secondary | ICD-10-CM | POA: Diagnosis present

## 2014-08-02 DIAGNOSIS — N189 Chronic kidney disease, unspecified: Secondary | ICD-10-CM | POA: Diagnosis present

## 2014-08-02 DIAGNOSIS — I6529 Occlusion and stenosis of unspecified carotid artery: Secondary | ICD-10-CM | POA: Diagnosis not present

## 2014-08-02 DIAGNOSIS — F29 Unspecified psychosis not due to a substance or known physiological condition: Secondary | ICD-10-CM | POA: Diagnosis not present

## 2014-08-02 DIAGNOSIS — Z7982 Long term (current) use of aspirin: Secondary | ICD-10-CM | POA: Diagnosis not present

## 2014-08-02 DIAGNOSIS — I129 Hypertensive chronic kidney disease with stage 1 through stage 4 chronic kidney disease, or unspecified chronic kidney disease: Secondary | ICD-10-CM | POA: Diagnosis present

## 2014-08-02 DIAGNOSIS — D320 Benign neoplasm of cerebral meninges: Secondary | ICD-10-CM | POA: Diagnosis present

## 2014-08-02 DIAGNOSIS — G43109 Migraine with aura, not intractable, without status migrainosus: Secondary | ICD-10-CM | POA: Diagnosis not present

## 2014-08-02 DIAGNOSIS — G459 Transient cerebral ischemic attack, unspecified: Secondary | ICD-10-CM | POA: Diagnosis not present

## 2014-08-02 DIAGNOSIS — N3000 Acute cystitis without hematuria: Secondary | ICD-10-CM | POA: Diagnosis present

## 2014-08-02 DIAGNOSIS — R9431 Abnormal electrocardiogram [ECG] [EKG]: Secondary | ICD-10-CM | POA: Diagnosis not present

## 2014-08-02 DIAGNOSIS — I10 Essential (primary) hypertension: Secondary | ICD-10-CM | POA: Diagnosis not present

## 2014-08-02 DIAGNOSIS — H538 Other visual disturbances: Secondary | ICD-10-CM | POA: Diagnosis not present

## 2014-08-02 DIAGNOSIS — Z882 Allergy status to sulfonamides status: Secondary | ICD-10-CM | POA: Diagnosis not present

## 2014-08-02 LAB — COMPREHENSIVE METABOLIC PANEL
AST: 15 U/L (ref 15–37)
Albumin: 3.6 g/dL (ref 3.4–5.0)
Alkaline Phosphatase: 51 U/L
Anion Gap: 7 (ref 7–16)
BUN: 16 mg/dL (ref 7–18)
Bilirubin,Total: 0.7 mg/dL (ref 0.2–1.0)
Calcium, Total: 9.1 mg/dL (ref 8.5–10.1)
Chloride: 101 mmol/L (ref 98–107)
Co2: 31 mmol/L (ref 21–32)
Creatinine: 0.63 mg/dL (ref 0.60–1.30)
EGFR (African American): >60
EGFR (Non-African Amer.): >60
Glucose: 100 mg/dL — ABNORMAL HIGH (ref 65–99)
Osmolality: 279 (ref 275–301)
Potassium: 4.1 mmol/L (ref 3.5–5.1)
SGPT (ALT): 15 U/L
Sodium: 139 mmol/L (ref 136–145)
Total Protein: 6.8 g/dL (ref 6.4–8.2)

## 2014-08-02 LAB — CBC WITH DIFFERENTIAL/PLATELET
Basophil #: 0 10*3/uL (ref 0.0–0.1)
Basophil %: 0.6 %
EOS ABS: 0 10*3/uL (ref 0.0–0.7)
Eosinophil %: 0.6 %
HCT: 40.1 % (ref 35.0–47.0)
HGB: 13 g/dL (ref 12.0–16.0)
LYMPHS ABS: 1 10*3/uL (ref 1.0–3.6)
Lymphocyte %: 12.5 %
MCH: 29.5 pg (ref 26.0–34.0)
MCHC: 32.4 g/dL (ref 32.0–36.0)
MCV: 91 fL (ref 80–100)
Monocyte #: 0.8 x10 3/mm (ref 0.2–0.9)
Monocyte %: 9.6 %
NEUTROS PCT: 76.7 %
Neutrophil #: 6.2 10*3/uL (ref 1.4–6.5)
Platelet: 498 10*3/uL — ABNORMAL HIGH (ref 150–440)
RBC: 4.4 10*6/uL (ref 3.80–5.20)
RDW: 13 % (ref 11.5–14.5)
WBC: 8.1 10*3/uL (ref 3.6–11.0)

## 2014-08-02 LAB — URINALYSIS, COMPLETE
Bacteria: NONE SEEN
Bilirubin,UR: NEGATIVE
Blood: NEGATIVE
Glucose,UR: NEGATIVE mg/dL (ref 0–75)
Nitrite: NEGATIVE
PH: 7 (ref 4.5–8.0)
PROTEIN: NEGATIVE
RBC,UR: <1 /HPF (ref 0–5)
SQUAMOUS EPITHELIAL: NONE SEEN
Specific Gravity: 1.006 (ref 1.003–1.030)
WBC UR: 5 /HPF (ref 0–5)

## 2014-08-02 LAB — TROPONIN I

## 2014-08-03 DIAGNOSIS — N3000 Acute cystitis without hematuria: Secondary | ICD-10-CM | POA: Diagnosis not present

## 2014-08-03 DIAGNOSIS — F29 Unspecified psychosis not due to a substance or known physiological condition: Secondary | ICD-10-CM | POA: Diagnosis not present

## 2014-08-03 DIAGNOSIS — G459 Transient cerebral ischemic attack, unspecified: Secondary | ICD-10-CM | POA: Diagnosis not present

## 2014-08-03 LAB — BASIC METABOLIC PANEL
ANION GAP: 7 (ref 7–16)
BUN: 12 mg/dL (ref 7–18)
CO2: 31 mmol/L (ref 21–32)
Calcium, Total: 8 mg/dL — ABNORMAL LOW (ref 8.5–10.1)
Chloride: 103 mmol/L (ref 98–107)
Creatinine: 0.57 mg/dL — ABNORMAL LOW (ref 0.60–1.30)
Glucose: 80 mg/dL (ref 65–99)
OSMOLALITY: 280 (ref 275–301)
Potassium: 3.9 mmol/L (ref 3.5–5.1)
SODIUM: 141 mmol/L (ref 136–145)

## 2014-08-03 LAB — CBC WITH DIFFERENTIAL/PLATELET
Basophil #: 0.1 10*3/uL (ref 0.0–0.1)
Basophil %: 0.7 %
Eosinophil #: 0.1 10*3/uL (ref 0.0–0.7)
Eosinophil %: 1.5 %
HCT: 38.5 % (ref 35.0–47.0)
HGB: 13.3 g/dL (ref 12.0–16.0)
LYMPHS ABS: 1.4 10*3/uL (ref 1.0–3.6)
Lymphocyte %: 17.6 %
MCH: 31 pg (ref 26.0–34.0)
MCHC: 34.4 g/dL (ref 32.0–36.0)
MCV: 90 fL (ref 80–100)
Monocyte #: 1.1 x10 3/mm — ABNORMAL HIGH (ref 0.2–0.9)
Monocyte %: 14.5 %
Neutrophil #: 5.1 10*3/uL (ref 1.4–6.5)
Neutrophil %: 65.7 %
PLATELETS: 467 10*3/uL — AB (ref 150–440)
RBC: 4.28 10*6/uL (ref 3.80–5.20)
RDW: 13.2 % (ref 11.5–14.5)
WBC: 7.8 10*3/uL (ref 3.6–11.0)

## 2014-08-03 LAB — LIPID PANEL
Cholesterol: 120 mg/dL (ref 0–200)
HDL: 48 mg/dL (ref 40–60)
LDL CHOLESTEROL, CALC: 59 mg/dL (ref 0–100)
TRIGLYCERIDES: 64 mg/dL (ref 0–200)
VLDL CHOLESTEROL, CALC: 13 mg/dL (ref 5–40)

## 2014-08-03 LAB — TSH: THYROID STIMULATING HORM: 1.08 u[IU]/mL

## 2014-08-04 DIAGNOSIS — I6350 Cerebral infarction due to unspecified occlusion or stenosis of unspecified cerebral artery: Secondary | ICD-10-CM | POA: Diagnosis not present

## 2014-08-09 DIAGNOSIS — H539 Unspecified visual disturbance: Secondary | ICD-10-CM | POA: Diagnosis not present

## 2014-08-09 DIAGNOSIS — N390 Urinary tract infection, site not specified: Secondary | ICD-10-CM | POA: Diagnosis not present

## 2014-08-09 DIAGNOSIS — I10 Essential (primary) hypertension: Secondary | ICD-10-CM | POA: Diagnosis not present

## 2014-08-13 DIAGNOSIS — D320 Benign neoplasm of cerebral meninges: Secondary | ICD-10-CM | POA: Diagnosis not present

## 2014-08-27 ENCOUNTER — Telehealth: Payer: Self-pay

## 2014-08-27 NOTE — Telephone Encounter (Signed)
Pt would like Bystolic 5 mg samples.

## 2014-08-27 NOTE — Telephone Encounter (Signed)
Placed samples of Bystolic 5 mg @ front desk for pick up.

## 2014-09-25 ENCOUNTER — Telehealth: Payer: Self-pay | Admitting: *Deleted

## 2014-09-25 NOTE — Telephone Encounter (Signed)
LMOM samples of bystolic 5 mg are available.

## 2014-09-25 NOTE — Telephone Encounter (Signed)
Patient needs samples of Bystolic 5 mg. Please call when ready for pick up.

## 2014-09-26 DIAGNOSIS — H40051 Ocular hypertension, right eye: Secondary | ICD-10-CM | POA: Diagnosis not present

## 2014-09-28 ENCOUNTER — Telehealth: Payer: Self-pay

## 2014-09-28 NOTE — Telephone Encounter (Signed)
Spoke w/ pt.  She states that her heart is skipping beats. Denies SOB or chest pain.  States that she lives at [facility] and that the nurse took care of her while she felt bad.  Advised her that Dr. Rockey Situ is not in the office at this time. Offered her appt on Monday to eval.  She is agreeable to this.  Pt verbalizes understanding that if sx become emergent to call 911 or proceed to nearest ED.

## 2014-09-28 NOTE — Telephone Encounter (Signed)
Pt states her HR is irregular, states her heart will "beat 3 beats and then stop, and then beat 12 beats and stop" please call.

## 2014-10-01 ENCOUNTER — Ambulatory Visit (INDEPENDENT_AMBULATORY_CARE_PROVIDER_SITE_OTHER): Payer: Medicare Other | Admitting: Cardiovascular Disease

## 2014-10-01 ENCOUNTER — Encounter: Payer: Self-pay | Admitting: Cardiovascular Disease

## 2014-10-01 VITALS — BP 140/80 | HR 68 | Ht 64.0 in | Wt 135.0 lb

## 2014-10-01 DIAGNOSIS — I6523 Occlusion and stenosis of bilateral carotid arteries: Secondary | ICD-10-CM | POA: Diagnosis not present

## 2014-10-01 DIAGNOSIS — H539 Unspecified visual disturbance: Secondary | ICD-10-CM

## 2014-10-01 DIAGNOSIS — I10 Essential (primary) hypertension: Secondary | ICD-10-CM

## 2014-10-01 DIAGNOSIS — R002 Palpitations: Secondary | ICD-10-CM | POA: Diagnosis not present

## 2014-10-01 DIAGNOSIS — D329 Benign neoplasm of meninges, unspecified: Secondary | ICD-10-CM

## 2014-10-01 DIAGNOSIS — F4322 Adjustment disorder with anxiety: Secondary | ICD-10-CM | POA: Diagnosis not present

## 2014-10-01 DIAGNOSIS — I471 Supraventricular tachycardia: Secondary | ICD-10-CM | POA: Diagnosis not present

## 2014-10-01 MED ORDER — RAMIPRIL 10 MG PO CAPS: 10.0000 mg | ORAL_CAPSULE | Freq: Every day | ORAL | Status: DC

## 2014-10-01 NOTE — Assessment & Plan Note (Signed)
Recent hospitalization for vision change diagnosed with meningioma. Hospital records were reviewed

## 2014-10-01 NOTE — Progress Notes (Signed)
Patient ID: Shelly Padilla, female    DOB: 1920/12/06, 80 y.o.   MRN: [id number]  HPI Comments: 80 year old female with history of hypertension , insomnia , SVT, APCs and PVCs that are symptomatic with previous trial of diltiazem, metoprolol, amiodarone who presents for routine followup.   She takes care of her husband who has Alzheimer's.  Recent hospitalization August 20 with discharge 08/03/2014 with vision changes, diagnosed with right temporoparietal meningioma 2 x 2 centimeters  She had echocardiogram 08/03/2014 showing normal ejection fraction CT scan of the head showing atrophy and chronic small vessel disease MRI of the brain showing 2 cm right posterior temporal meningioma Carotid ultrasound showing mild bilateral atherosclerosis  Lab work showing total cholesterol 120, LDL 59 Medical management was recommended for her meningioma  Per the patient. She has followup neurosurgery  Significant stress recently with her husband. He had pneumonia, now in full-time nursing She feels that her memory is getting worse  Rare palpitations  Holter monitor November 2011 showed paroxysmal SVT.  lab work showing creatinine 0.6, hematocrit 42   EKG today shows normal sinus rhythm with rate 68 beats per minute, right bundle branch block, left axis deviation    Outpatient Encounter Prescriptions as of 10/01/2014  Medication Sig  . aspirin 81 MG tablet Take 81 mg by mouth daily.  . cholecalciferol (VITAMIN D) 400 UNITS TABS Take 400 Units by mouth daily.  . Multiple Minerals-Vitamins (CALCIUM-MAGNESIUM-ZINC) TABS Take by mouth daily.  . Multiple Vitamins-Minerals (CENTRUM SILVER PO) Take by mouth daily.  . nebivolol (BYSTOLIC) 5 MG tablet Take 1 tablet (5 mg total) by mouth daily.  . propranolol (INDERAL) 20 MG tablet Take 1 tablet (20 mg total) by mouth 3 (three) times daily as needed.  . ramipril (ALTACE) 10 MG capsule Take 10 mg by mouth daily.  . timolol (TIMOPTIC) 0.5 % ophthalmic  solution as directed.  . zolpidem (AMBIEN) 5 MG tablet At bedtime as needed.   Review of Systems  Constitutional: Negative.   HENT: Negative.   Eyes: Positive for visual disturbance.  Respiratory: Negative.   Cardiovascular: Positive for palpitations.  Gastrointestinal: Negative.   Endocrine: Negative.   Musculoskeletal: Negative.   Skin: Negative.   Allergic/Immunologic: Negative.   Neurological: Negative.   Hematological: Negative.   Psychiatric/Behavioral: Negative.   All other systems reviewed and are negative.   BP 140/80  Pulse 68  Ht 5\' 4"  (1.626 m)  Wt 135 lb (61.236 kg)  BMI 23.16 kg/m2  Physical Exam  Nursing note and vitals reviewed. Constitutional: She is oriented to person, place, and time. She appears well-developed and well-nourished.  HENT:  Head: Normocephalic.  Nose: Nose normal.  Mouth/Throat: Oropharynx is clear and moist.  Eyes: Conjunctivae are normal. Pupils are equal, round, and reactive to light.  Neck: Normal range of motion. Neck supple. No JVD present.  Cardiovascular: Normal rate, regular rhythm, S1 normal, S2 normal, normal heart sounds and intact distal pulses.  Exam reveals no gallop and no friction rub.   No murmur heard. Pulmonary/Chest: Effort normal and breath sounds normal. No respiratory distress. She has no wheezes. She has no rales. She exhibits no tenderness.  Abdominal: Soft. Bowel sounds are normal. She exhibits no distension. There is no tenderness.  Musculoskeletal: Normal range of motion. She exhibits no edema and no tenderness.  Lymphadenopathy:    She has no cervical adenopathy.  Neurological: She is alert and oriented to person, place, and time. Coordination normal.  Skin: Skin is warm and dry.  No rash noted. No erythema.  Psychiatric: She has a normal mood and affect. Her behavior is normal. Judgment and thought content normal.    Assessment and Plan

## 2014-10-01 NOTE — Assessment & Plan Note (Signed)
Mild bilateral carotid stenosis. Cholesterol 120

## 2014-10-01 NOTE — Patient Instructions (Addendum)
Your next appointment will be scheduled in our new office located at :  [facility]  [address], [place], [place] [address]  You are doing well. No medication changes were made.  Please call us if you have new issues that need to be addressed before your next appt.  Your physician wants you to follow-up in: 6 months.  You will receive a reminder letter in the mail two months in advance. If you don't receive a letter, please call our office to schedule the follow-up appointment.

## 2014-10-01 NOTE — Assessment & Plan Note (Signed)
She has indicated that she does not want intervention and prefers medical management

## 2014-10-01 NOTE — Assessment & Plan Note (Signed)
Symptoms have been well-controlled on her beta blockers.  she takes propranolol for breakthrough arrhythmia

## 2014-10-01 NOTE — Assessment & Plan Note (Signed)
She continues to have a difficult time with managing her husbands illness. Husband now in full-time nursing but she plans on bringing him home again with help

## 2014-10-01 NOTE — Assessment & Plan Note (Signed)
Ramapril has been recently increased up to 10 mg daily. She reports blood pressure has been relatively well-controlled

## 2014-10-26 DIAGNOSIS — H40051 Ocular hypertension, right eye: Secondary | ICD-10-CM | POA: Diagnosis not present

## 2014-10-29 ENCOUNTER — Telehealth: Payer: Self-pay

## 2014-10-29 NOTE — Telephone Encounter (Signed)
Pt needs Bystolic samples

## 2014-10-30 NOTE — Telephone Encounter (Signed)
Samples available to pick up for bystolic.

## 2014-11-19 ENCOUNTER — Telehealth: Payer: Self-pay | Admitting: Family Medicine

## 2014-11-19 NOTE — Telephone Encounter (Signed)
Patient called to find out if you have received her records from [facility].  She requested them on 10/23/14.

## 2014-11-19 NOTE — Telephone Encounter (Signed)
I don't remember getting any records on her---I would have left them on my desk. We do have some of her records because we share a chart with Dr Rockey Situ

## 2014-12-18 ENCOUNTER — Telehealth: Payer: Self-pay

## 2014-12-18 NOTE — Telephone Encounter (Signed)
Pt would like Bystolic samples 5 mg

## 2014-12-18 NOTE — Telephone Encounter (Signed)
Notified patient bystolic 5 mg samples available to pick up.

## 2014-12-26 ENCOUNTER — Ambulatory Visit (INDEPENDENT_AMBULATORY_CARE_PROVIDER_SITE_OTHER): Payer: Medicare Other | Admitting: Internal Medicine

## 2014-12-26 ENCOUNTER — Encounter: Payer: Self-pay | Admitting: Internal Medicine

## 2014-12-26 VITALS — BP 152/72 | HR 85 | Temp 98.4°F | Ht 64.0 in | Wt 136.1 lb

## 2014-12-26 DIAGNOSIS — Z7189 Other specified counseling: Secondary | ICD-10-CM

## 2014-12-26 DIAGNOSIS — I10 Essential (primary) hypertension: Secondary | ICD-10-CM

## 2014-12-26 DIAGNOSIS — R143 Flatulence: Secondary | ICD-10-CM

## 2014-12-26 DIAGNOSIS — F39 Unspecified mood [affective] disorder: Secondary | ICD-10-CM | POA: Diagnosis not present

## 2014-12-26 DIAGNOSIS — I471 Supraventricular tachycardia: Secondary | ICD-10-CM | POA: Diagnosis not present

## 2014-12-26 LAB — CBC WITH DIFFERENTIAL/PLATELET
Basophils Absolute: 0 10*3/uL (ref 0.0–0.1)
Basophils Relative: 0.3 % (ref 0.0–3.0)
EOS ABS: 0.1 10*3/uL (ref 0.0–0.7)
EOS PCT: 0.6 % (ref 0.0–5.0)
HEMATOCRIT: 42.6 % (ref 36.0–46.0)
Hemoglobin: 13.8 g/dL (ref 12.0–15.0)
Lymphocytes Relative: 10.4 % — ABNORMAL LOW (ref 12.0–46.0)
Lymphs Abs: 1.1 10*3/uL (ref 0.7–4.0)
MCHC: 32.5 g/dL (ref 30.0–36.0)
MCV: 90.7 fl (ref 78.0–100.0)
MONO ABS: 0.8 10*3/uL (ref 0.1–1.0)
Monocytes Relative: 8.1 % (ref 3.0–12.0)
NEUTROS PCT: 80.6 % — AB (ref 43.0–77.0)
Neutro Abs: 8.2 10*3/uL — ABNORMAL HIGH (ref 1.4–7.7)
PLATELETS: 644 10*3/uL — AB (ref 150.0–400.0)
RBC: 4.7 Mil/uL (ref 3.87–5.11)
RDW: 13.7 % (ref 11.5–15.5)
WBC: 10.2 10*3/uL (ref 4.0–10.5)

## 2014-12-26 LAB — COMPREHENSIVE METABOLIC PANEL
ALT: 16 U/L (ref 0–35)
AST: 23 U/L (ref 0–37)
Albumin: 4.3 g/dL (ref 3.5–5.2)
Alkaline Phosphatase: 53 U/L (ref 39–117)
BILIRUBIN TOTAL: 0.6 mg/dL (ref 0.2–1.2)
BUN: 22 mg/dL (ref 6–23)
CHLORIDE: 99 meq/L (ref 96–112)
CO2: 33 meq/L — AB (ref 19–32)
CREATININE: 0.49 mg/dL (ref 0.40–1.20)
Calcium: 9.4 mg/dL (ref 8.4–10.5)
GFR: 125.41 mL/min (ref >60.00)
Glucose, Bld: 92 mg/dL (ref 70–99)
Potassium: 4.1 mEq/L (ref 3.5–5.1)
SODIUM: 137 meq/L (ref 135–145)
Total Protein: 7.3 g/dL (ref 6.0–8.3)

## 2014-12-26 LAB — T4, FREE: Free T4: 0.86 ng/dL (ref 0.60–1.60)

## 2014-12-26 NOTE — Patient Instructions (Signed)
Please try lactaid milk instead of regular milk. If that doesn't help your gas--try the low FODMAP diet I gave you.

## 2014-12-26 NOTE — Assessment & Plan Note (Signed)
Mostly from stress with husband with progressive dementia Counseled on this Did see Lutricia Horsfall

## 2014-12-26 NOTE — Assessment & Plan Note (Signed)
Doesn't seem to be very active Does see Dr Rockey Situ and is on beta blocker

## 2014-12-26 NOTE — Progress Notes (Signed)
Subjective:    Patient ID: Shelly Padilla, female    DOB: 1919/12/25, 81 y.o.   MRN: [id number]  HPI Here to establish with friend Diane Transferring care--I met her in health care with her husband  Relatively healthy Main problem is gas Does drink a lot of milk Bowels are regular about every other day No abdominal pain Appetite is good No N/V  Did have recent abdominal/pelvic ultrasound This was reassuring and was reviewed  Stress with husband Dementia---- but sleeps well and not agitated Needs set up assist for dressing but does other ADLs Incontinent of urine but wears diapers No hired help other than every 2 week housekeeping--but he goes to [employer] 4 days per week  Trouble with veins in right leg Enlarged and are painful at times Distant leg stripping Not too bad  History of SVT On beta blocker No recent problems with this Recent diagnosis of HTN--okay on meds now Melatonin made it worse-- discussed not to take this Does have ongoing problems with sleep  Current Outpatient Prescriptions on File Prior to Visit  Medication Sig Dispense Refill  . aspirin 81 MG tablet Take 81 mg by mouth daily.    . cholecalciferol (VITAMIN D) 400 UNITS TABS Take 400 Units by mouth daily.    . Multiple Minerals-Vitamins (CALCIUM-MAGNESIUM-ZINC) TABS Take by mouth daily.    . Multiple Vitamins-Minerals (CENTRUM SILVER PO) Take by mouth daily.    . nebivolol (BYSTOLIC) 5 MG tablet Take 1 tablet (5 mg total) by mouth daily. 30 tablet 11  . propranolol (INDERAL) 20 MG tablet Take 1 tablet (20 mg total) by mouth 3 (three) times daily as needed. 90 tablet 6  . ramipril (ALTACE) 10 MG capsule Take 1 capsule (10 mg total) by mouth daily. 90 capsule 3   No current facility-administered medications on file prior to visit.    Allergies  Allergen Reactions  . Codeine   . Sulfa Drugs Cross Reactors     Past Medical History  Diagnosis Date  . Glaucoma   . HTN (hypertension)   . Atrial  fibrillation   . History of ovarian cyst   . Palpitations   . H/O paroxysmal supraventricular tachycardia     documented by Holter Monitor  . Insomnia   . Hemorrhoids   . Hiatal hernia   . Brain tumor     meningioma    Past Surgical History  Procedure Laterality Date  . Cataract extraction      left eye  . Tonsillectomy  1929  . Leg vein stripping  1970    Family History  Problem Relation Age of Onset  . Heart attack Father   . Heart failure Brother     History   Social History  . Marital Status: Married    Spouse Name: N/A    Number of Children: 1  . Years of Education: N/A   Occupational History  . retired     Network engineer   Social History Main Topics  . Smoking status: Never Smoker   . Smokeless tobacco: Never Used  . Alcohol Use: 0.0 oz/week    0 Not specified per week     Comment: occasionally wine  . Drug Use: No  . Sexual Activity: Not on file   Other Topics Concern  . 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      Has living will   Son is health care POA  Requests DNR   No tube feeds if cognitively unaware   Review of Systems  Constitutional: Negative for fatigue and unexpected weight change.  HENT: Positive for hearing loss.        Dry mouth at times Teeth are okay---no dentures  Eyes: Negative for visual disturbance.  Respiratory: Negative for cough, chest tightness and shortness of breath.   Cardiovascular: Positive for palpitations and leg swelling. Negative for chest pain.       Rare irregularity in heart  Gastrointestinal: Negative for nausea, vomiting, abdominal pain and constipation.  Endocrine: Negative for polydipsia and polyuria.  Genitourinary: Negative for dysuria, hematuria and difficulty urinating.  Musculoskeletal: Positive for back pain. Negative for arthralgias.       No meds for back  Allergic/Immunologic: Negative for environmental allergies and immunocompromised state.    Neurological: Negative for dizziness, syncope, light-headedness and headaches.  Psychiatric/Behavioral: Positive for sleep disturbance and decreased concentration. Negative for dysphoric mood. The patient is not nervous/anxious.        Mild memory issues No longer drives due to concerns about safety       Objective:   Physical Exam  Constitutional: She appears well-developed and well-nourished. No distress.  Neck: Normal range of motion. Neck supple. No thyromegaly present.  Cardiovascular: Normal rate, regular rhythm, normal heart sounds and intact distal pulses.  Exam reveals no gallop.   No murmur heard. 1+ pulse on right, faint on left  Pulmonary/Chest: Effort normal and breath sounds normal. No respiratory distress. She has no wheezes. She has no rales.  Abdominal: Soft. She exhibits no distension. There is no tenderness. There is no rebound.  Musculoskeletal: She exhibits no tenderness.  1+ edema in right ankle Mild venous engorgement on right leg but not tender  Lymphadenopathy:    She has no cervical adenopathy.  Skin: No rash noted. No erythema.  Benign keratoses  Psychiatric: She has a normal mood and affect. Her behavior is normal.          Assessment & Plan:

## 2014-12-26 NOTE — Assessment & Plan Note (Signed)
BP Readings from Last 3 Encounters:  12/26/14 152/72  10/01/14 140/80  05/18/14 150/80   Reasonable control for her age No changes needed

## 2014-12-26 NOTE — Assessment & Plan Note (Signed)
No worrisome GI symptoms Discussed changing to lactaid milk Low FODMAP diet info given

## 2014-12-26 NOTE — Progress Notes (Signed)
Pre visit review using our clinic review tool, if applicable. No additional management support is needed unless otherwise documented below in the visit note. 

## 2014-12-26 NOTE — Assessment & Plan Note (Signed)
See social history DNR done today 

## 2014-12-27 ENCOUNTER — Encounter: Payer: Self-pay | Admitting: *Deleted

## 2014-12-28 ENCOUNTER — Telehealth: Payer: Self-pay | Admitting: Internal Medicine

## 2014-12-28 NOTE — Telephone Encounter (Signed)
emm emailed

## 2015-01-28 ENCOUNTER — Telehealth: Payer: Self-pay | Admitting: Internal Medicine

## 2015-01-28 NOTE — Telephone Encounter (Signed)
[facility]  Patient Name: BRAYLYN EYE  DOB: 1920-01-23    Initial Comment Caller states need to come in to lab and have a urinalysis . have a urge to got urinate then when I get there I barly can urinate. have urgency to urinate    Nurse Assessment      Guidelines    Guideline Title Affirmed Question Affirmed Notes       Final Disposition User   FINAL ATTEMPT MADE - no message left Harlow Mares, Therapist, sports, Suanne Marker

## 2015-01-29 NOTE — Telephone Encounter (Signed)
She needs to have appt to be seen Can add on tomorrow if noone can see her today

## 2015-01-29 NOTE — Telephone Encounter (Signed)
Spoke to patient and was advised that she has already scheduled an appointment for tomorrow 01/30/15 and is not able to come today because of no transportation.

## 2015-01-30 ENCOUNTER — Encounter: Payer: Self-pay | Admitting: Family Medicine

## 2015-01-30 ENCOUNTER — Ambulatory Visit (INDEPENDENT_AMBULATORY_CARE_PROVIDER_SITE_OTHER): Payer: Medicare Other | Admitting: Family Medicine

## 2015-01-30 VITALS — BP 160/84 | HR 84 | Temp 97.9°F | Wt 137.2 lb

## 2015-01-30 DIAGNOSIS — R350 Frequency of micturition: Secondary | ICD-10-CM | POA: Diagnosis not present

## 2015-01-30 DIAGNOSIS — N3090 Cystitis, unspecified without hematuria: Secondary | ICD-10-CM

## 2015-01-30 DIAGNOSIS — I8390 Asymptomatic varicose veins of unspecified lower extremity: Secondary | ICD-10-CM

## 2015-01-30 DIAGNOSIS — I868 Varicose veins of other specified sites: Secondary | ICD-10-CM | POA: Diagnosis not present

## 2015-01-30 LAB — POCT URINALYSIS DIPSTICK
BILIRUBIN UA: NEGATIVE
Blood, UA: NEGATIVE
Glucose, UA: NEGATIVE
Ketones, UA: NEGATIVE
LEUKOCYTES UA: NEGATIVE
NITRITE UA: NEGATIVE
PH UA: 6.5
Spec Grav, UA: 1.015
Urobilinogen, UA: 0.2

## 2015-01-30 MED ORDER — CIPROFLOXACIN HCL 250 MG PO TABS: 250.0000 mg | ORAL_TABLET | Freq: Two times a day (BID) | ORAL | Status: DC

## 2015-01-30 NOTE — Patient Instructions (Signed)
Drink plenty of water and start the antibiotics today.  We'll contact you with your lab report.  Take care.   

## 2015-01-30 NOTE — Progress Notes (Signed)
Pre visit review using our clinic review tool, if applicable. No additional management support is needed unless otherwise documented below in the visit note.  She had a prev UTI (she had ER and eye clinic eval for visual changes at that point, sx thought to be due to UTI) last fall.  She did well until Sunday with a repeat of similar sx.  Normal vision now.  Previously had some blurry vision, but not vision loss.   The change was in B eyes prev.  No fevers.  No vomiting.  No diarrhea.  She prev had some intermittent dysuria, more recently with some urgency and frequency.  Some lower abd pain, now with some recently lower back pain.    She has tender varicose veins in her R thigh>calf.    Meds, vitals, and allergies reviewed.   ROS: See HPI.  Otherwise, noncontributory.  nad ncat Tm wnl Nasal and OP exam wnl EOMI, chronic R pupil changes noted.  MMM Neck supple, no LA rrr ctab Suprapubic area ttp, normal BS No CVA pain No edema but varicose veins noted on R thigh and calf, slightly ttp

## 2015-01-31 DIAGNOSIS — I8390 Asymptomatic varicose veins of unspecified lower extremity: Secondary | ICD-10-CM | POA: Insufficient documentation

## 2015-01-31 DIAGNOSIS — N3090 Cystitis, unspecified without hematuria: Secondary | ICD-10-CM | POA: Insufficient documentation

## 2015-01-31 LAB — URINE CULTURE
COLONY COUNT: NO GROWTH
ORGANISM ID, BACTERIA: NO GROWTH

## 2015-01-31 NOTE — Assessment & Plan Note (Signed)
Presumed, given her hx of similar sx and suprapubic pain.  Start cipro and check ucx.

## 2015-01-31 NOTE — Assessment & Plan Note (Addendum)
Routed to PCP for his input on the thigh veins.  I didn't intervene at OV.    Discussed with Dr Benson Setting patient and suggested vascular evaluation She wishes to hold off for now---if it gets worse she can let me know and I will initiate a consult

## 2015-01-31 NOTE — Progress Notes (Signed)
Please put in the referral.  Thanks.

## 2015-02-01 DIAGNOSIS — H35031 Hypertensive retinopathy, right eye: Secondary | ICD-10-CM | POA: Diagnosis not present

## 2015-02-28 ENCOUNTER — Telehealth: Payer: Self-pay | Admitting: *Deleted

## 2015-02-28 NOTE — Telephone Encounter (Signed)
Do not have bystolic 5 mg. We will contact drug rep for samples.

## 2015-02-28 NOTE — Telephone Encounter (Signed)
Patient needs samples of Bystolic 5 mg.  Please call when ready for pick up. Thanks!

## 2015-03-04 NOTE — Telephone Encounter (Signed)
Patient notified samples of Bystolic 5 mg  available to pick up.

## 2015-04-06 NOTE — Discharge Summary (Signed)
PATIENT NAME:  Shelly Padilla, Shelly Padilla MR#:  [id number] DATE OF BIRTH:  09-15-1920  DATE OF ADMISSION:  08/02/2014 DATE OF DISCHARGE:  08/03/2014  DISCHARGE DIAGNOSES:  1. Right temporoparietal meningioma of 2 x 2 cm with vision changes.  2. Hypertension, uncontrolled.   IMAGING STUDIES: Includes a CT scan of the head without contrast, which was normal, showed atrophy related to age.   MRI of the brain with and without contrast showed findings consistent with slightly greater than 2 cm right posterior temporal meningioma. No acute stroke, atrophy, chronic microvascular ischemic changes.   Ultrasound carotids showed no significant stenosis.   Echocardiogram showed EF of 65% to 55% with diastolic dysfunction. No thrombus.   ADMITTING HISTORY AND PHYSICAL: Please see detailed H and P dictated by Dr. Margaretmary Eddy. In brief, a 82 year old female patient with history of chronic hearing problems, CKD, glaucoma, hypertension, presented to the hospital complaining of blurry vision. The patient was seen in the ophthalmology office earlier with symptoms of blurry vision, lines in vision on the left eye. She was sent to the Emergency Room for concern for a neurological stroke.   HOSPITAL COURSE: Vision changes. The patient had initially a CT scan of the head done, which was normal. She was admitted on the tele floor to look for a stroke with neuro checks. Carotid Dopplers ultrasound showed no acute abnormalities. She did have an MRI of the brain with and without contrast, which showed right temporoparietal meningioma, a little greater than 2 cm, but the patient's symptoms have resolved. It is unclear if the meningioma is causing her symptoms at this point. She likely has had this for a long time. No strokes on MRI. I have discussed with the patient regarding surgery. She is not very keen on surgery so we have set her up with an appointment with neurosurgery in [place] in a week for followup with meningioma. She has been  requested to call her doctor or return to the Emergency Room if she has any further symptoms. She also had uncontrolled hypertension for which her dose on the ramipril has been increased. Bystolic stays the same. Blood pressure is improved.   Prior to discharge, the patient has no vision changes. Her motor strength is 5/5 in upper and lower extremities. Lungs sound clear. No carotid bruits.  DISCHARGE MEDICATIONS:  1. Bystolic 5 mg oral once a day.  2. Aspirin 81 mg daily.  3. Timolol ophthalmic 1 drop to right eye once a day.  4. Ramipril 10 mg oral 2 times a day.   DISCHARGE INSTRUCTIONS: Low-sodium, regular consistency diet. Activity as tolerated. Follow up with primary care physician in 1 to 2 weeks and with neurosurgery in a week.   TIME SPENT ON DAY OF DISCHARGE IN DISCHARGE ACTIVITY: 40 minutes.    ____________________________ Leia Alf Jaxston Chohan, MD srs:lt D: 08/05/2014 12:00:02 ET T: 08/05/2014 16:34:20 ET JOB#: [id number]  cc: Alveta Heimlich R. Sedale Jenifer, MD, <Dictator> Irven Easterly. Kary Kos, MD Neita Carp MD ELECTRONICALLY SIGNED 08/28/2014 16:29

## 2015-04-06 NOTE — H&P (Signed)
PATIENT NAME:  Shelly, Padilla MR#:  [id number] DATE OF BIRTH:  May 04, 1920  DATE OF ADMISSION:  08/02/2014  PRIMARY CARE PHYSICIAN:  Irven Easterly. Kary Kos, [place]: Dr. Archie Balboa.  CHIEF COMPLAINT: Blurry vision, frequent urination.   HISTORY OF PRESENT ILLNESS: The patient is a 81 year old elderly female with chronic hearing problems and a chronic history of glaucoma. She was sent over to the ED after she was evaluated by her eye doctor today. The patient is reporting that she has been blurry since this morning. She was watching TV and she noticed that everyone was looking alike to her. Initially, she thought there was a problem with TV, but subsequently after checking with neighbors, she feels there was a problem with her vision. She went to see her ophthalmologist who checked her thoroughly, and did not think it was because of her glaucoma or cataracts. He has sent over the patient to ED for further evaluation regarding her blurry vision as there was a concern about TIA.   In the ED, the patient had a CAT scan of the head done, which was apparently normal. The patient denies any headache or dysphagia. Denies any dysarthria. The patient denies any lower extremity weakness either. The patient was given aspirin and hospitalist team is called to admit the patient.   PAST MEDICAL HISTORY: Hypertension, glaucoma, chronic hearing trouble.   PAST SURGICAL HISTORY: Knee surgery and cataract surgery.   ALLERGIES: CODEINE AND SULFA DRUGS.   HOME MEDICATIONS: Bystolic 5 mg p.o. once daily. Enalapril 5 mg p.o. once daily, Tylenol, 0 0.5% ophthalmic drops into the right eye once a day, aspirin 81 mg p.o. once daily.   PSYCHOSOCIAL HISTORY: Lives at home with her husband. Husband has Alzheimer's disease and she takes care of him. Denies any history of smoking, alcohol or illicit drug usage.   FAMILY HISTORY: Hypertension runs in her family.   REVIEW OF SYSTEMS: CONSTITUTIONAL: Denies any  fever, fatigue, weakness.  EYES: Complaining of blurry vision. Has chronic history of cataracts and glaucoma.  ENT: Denies epistaxis, discharge, postnasal drip.  RESPIRATORY: Denies cough, chronic obstructive pulmonary disease.  CARDIOVASCULAR: No chest pain, palpitations. No murmurs. Denies any coronary artery disease.  GASTROINTESTINAL: Denies nausea, vomiting, diarrhea. No acid reflux. Denies any gastrointestinal bleeding, melena, hematemesis,  NEUROLOGIC: Denies any old history of transient ischemic attacks or CVAs. Denies any headache, but complaining of blurry vision. Denies any ataxia.  GENITOURINARY: No known vaginal discharge, but complaining of frequent urination.  MUSCULOSKELETAL: Complaining of lower leg pain, but denies any hip pain. Denies any history of rheumatoid arthritis, gout. PSYCHIATRIC: Denies any anxiety, depression, OCD. Bipolar disorder. HEMATOLOGIC: Denies any anemia, any easy bruising, bleeding. No cancers.   PHYSICAL EXAMINATION:  VITAL SIGNS: Temperature 98 degrees Fahrenheit, pulse 86, respirations 18, blood pressure 163/72, pulse oximetry was 94%.  GENERAL APPEARANCE: Not in acute distress. Moderately built, thin-looking, Caucasian gentleman in no apparent distress.  HEENT: Normocephalic, atraumatic. Pupils are equally reacting to light and accommodation. No scleral icterus. No conjunctival injection. No sinus tenderness. No postnasal drip. Moist mucous membranes.  NECK: Supple. No JVD or thyromegaly. Range of motion is intact.  LUNGS: Clear to auscultation bilaterally. No accessory muscle use and no anterior chest wall tenderness on palpation.  CARDIAC: S1, S2 normal. Regular rate and rhythm. No murmurs. No bruits. No peripheral edema.  GASTROINTESTINAL: Soft. Bowel sounds are positive in all 4 quadrants. Nontender, nondistended. No hepatosplenomegaly. No masses.  NEUROLOGIC: Oriented x3. Cranial nerves II through XII  are grossly intact. Motor and sensory are  intact. Reflexes are 2+.  EXTREMITIES: No cyanosis. No clubbing.  SKIN: Warm to touch. Normal turgor. No rashes. No lesions.  MUSCULOSKELETAL: No joint effusion, tenderness, erythema.   PSYCHIATRIC: Normal mood and affect.   LABORATORY AND IMAGING STUDIES: CT of the head without contrast: No acute findings atrophy and chronic microvascular ischemic changes.   URINALYSIS: Trace ketones. Leukocyte esterase is 1+. Nitrites are negative.   CBC is normal. LFTs are normal. Troponin less than 0.02. BMP is normal. Accu-Chek 93, glucose is elevated at 100.  EKG: Normal sinus rhythm at 75 beats per minute.  left anterior fascicular block.   ASSESSMENT AND PLAN: A 82 year old pleasant Caucasian female presenting to the Emergency Department as  she was sent over to the Emergency Department by her ophthalmologist for possible cerebrovascular accident as she was complaining of blurry vision since this a.m. The patient is also complaining of frequent urination.   ASSESSMENT AND PLAN:  1. Blood division, probably from transient ischemic attack versus cerebrovascular accident. CT head is negative. Will get complete stroke work-up with MRA of the brain tomorrow, carotid Dopplers and 2-D echocardiogram. We will continue neurology checks with vital signs. The patient will get an aspirin 325 mg p.o. once daily and statin. Will check fasting lipid panel. Physical therapy evaluation for possible ambulation difficulty as the patient is complaining of blurry vision.  2. Acute cystitis with frequent urination. Urinalysis is positive. Probably she has underlying acute cystitis. Will get urine culture and sensitivity. The patient will be on intravenous Rocephin empirically.  3. Check hemoglobin A1c. Rule out diabetes mellitus, as the patient is coming with blurry vision, and frequent urination. 4. Hypertension. Resume home medications, Bystolic, angiotensin-converting enzyme inhibitor. Will adjust the medication as needed.   5. Chronic hearing problems.  6. Will provide gastrointestinal prophylaxis with pepcid and deep vein thrombosis prophylaxis with Lovenox.   CODE STATUS: She is FULL CODE.   Plan of care was discussed in detail with the patient. She is aware of the plan.   TOTAL TIME SPENT ON THE ADMISSION: 50 minutes duration.    ____________________________ Nicholes Mango, MD ag:ls D: 08/02/2014 16:59:00 ET T: 08/02/2014 17:33:47 ET JOB#: [id number]  cc: Irven Easterly. Kary Kos, MD Nicholes Mango, MD, <Dictator>      Nicholes Mango MD ELECTRONICALLY SIGNED 08/16/2014 11:23

## 2015-04-10 ENCOUNTER — Ambulatory Visit (INDEPENDENT_AMBULATORY_CARE_PROVIDER_SITE_OTHER): Payer: Medicare Other | Admitting: Cardiovascular Disease

## 2015-04-10 ENCOUNTER — Encounter: Payer: Self-pay | Admitting: Cardiovascular Disease

## 2015-04-10 VITALS — BP 140/68 | HR 78 | Ht 63.0 in | Wt 138.0 lb

## 2015-04-10 DIAGNOSIS — I10 Essential (primary) hypertension: Secondary | ICD-10-CM | POA: Diagnosis not present

## 2015-04-10 DIAGNOSIS — I493 Ventricular premature depolarization: Secondary | ICD-10-CM

## 2015-04-10 DIAGNOSIS — F39 Unspecified mood [affective] disorder: Secondary | ICD-10-CM

## 2015-04-10 DIAGNOSIS — G4700 Insomnia, unspecified: Secondary | ICD-10-CM

## 2015-04-10 DIAGNOSIS — I6523 Occlusion and stenosis of bilateral carotid arteries: Secondary | ICD-10-CM | POA: Diagnosis not present

## 2015-04-10 MED ORDER — TRAZODONE HCL 50 MG PO TABS: 50.0000 mg | ORAL_TABLET | Freq: Every evening | ORAL | Status: DC | PRN

## 2015-04-10 NOTE — Assessment & Plan Note (Signed)
She is requesting a medication for insomnia. We have suggested she try trazodone 50 mg when necessary. Prescription provided Also recommended she start a regular walking program. I suspect days when she is more active, walking more, she will sleep better

## 2015-04-10 NOTE — Assessment & Plan Note (Signed)
Mild bilateral carotid stenosis. Cholesterol 120 in the past

## 2015-04-10 NOTE — Assessment & Plan Note (Signed)
Blood pressure is well controlled on today's visit. No changes made to the medications. 

## 2015-04-10 NOTE — Patient Instructions (Signed)
You are doing well.  Please try trazodone as needed for sleep, one pill before bed  Please take bystolic as needed for either high blood pressure and/or  palpitations  Please call us if you have new issues that need to be addressed before your next appt.  Your physician wants you to follow-up in: 6 months.  You will receive a reminder letter in the mail two months in advance. If you don't receive a letter, please call our office to schedule the follow-up appointment.

## 2015-04-10 NOTE — Assessment & Plan Note (Signed)
Some anxiety and depression, likely exacerbated from full-time care of her husband

## 2015-04-10 NOTE — Progress Notes (Signed)
Patient ID: Shelly Padilla, female    DOB: 07/01/1920, 81 y.o.   MRN: [id number]  HPI Comments: 81 year old female with history of hypertension , insomnia , SVT, APCs and PVCs that are symptomatic with previous trial of diltiazem, metoprolol, amiodarone who presents for routine followup of her palpitations and blood pressure  .  In general she reports that she is doing well She is having trouble with her hearing, spent $10,000 on hearing aids that do not seem to be working well. She has follow-up several months from now. Also having problems with her vision, again scheduled to see ophthalmology several months from now. She's not been taking her beta blocker. She does feel occasional palpitations, pauses and is bothered by them. She is trying to walk but no regular exercise. No recent falls. Relatively sedentary. Continued problems with insomnia, wakes up in the middle of the night unable to get back to sleep. Did have some improvement on Ambien in the past. Currently not on any medication, tried some over-the-counter medication which made her jittery, gave her tachycardia.  EKG on today's visit shows normal sinus rhythm with rate 78 bpm, right bundle branch block  Other past medical history  She takes care of her husband who has Alzheimer's. He is in  memory daycare several days per week Recent hospitalization August 20 with discharge 08/03/2014 with vision changes, diagnosed with right temporoparietal meningioma 2 x 2 centimeters  She had echocardiogram 08/03/2014 showing normal ejection fraction CT scan of the head showing atrophy and chronic small vessel disease MRI of the brain showing 2 cm right posterior temporal meningioma Carotid ultrasound showing mild bilateral atherosclerosis  Lab work showing total cholesterol 120, LDL 59 Medical management was recommended for her meningioma  Per the patient. She has followup neurosurgery  Holter monitor November 2011 showed paroxysmal  SVT.  Allergies  Allergen Reactions  . Codeine   . Sulfa Drugs Cross Reactors     Outpatient Encounter Prescriptions as of 04/10/2015  Medication Sig  . aspirin 81 MG tablet Take 81 mg by mouth daily.  . Multiple Minerals-Vitamins (CALCIUM-MAGNESIUM-ZINC) TABS Take by mouth daily.  . Multiple Vitamins-Minerals (CENTRUM SILVER PO) Take by mouth daily.  . ramipril (ALTACE) 10 MG capsule Take 1 capsule (10 mg total) by mouth daily.  . nebivolol (BYSTOLIC) 5 MG tablet Take 1 tablet (5 mg total) by mouth daily. (Patient not taking: Reported on 04/10/2015)  . traZODone (DESYREL) 50 MG tablet Take 1 tablet (50 mg total) by mouth at bedtime as needed for sleep.  . [DISCONTINUED] cholecalciferol (VITAMIN D) 400 UNITS TABS Take 400 Units by mouth daily.  . [DISCONTINUED] ciprofloxacin (CIPRO) 250 MG tablet Take 1 tablet (250 mg total) by mouth 2 (two) times daily. (Patient not taking: Reported on 04/10/2015)    Past Medical History  Diagnosis Date  . Glaucoma   . HTN (hypertension)   . Atrial fibrillation   . History of ovarian cyst   . Palpitations   . H/O paroxysmal supraventricular tachycardia     documented by Holter Monitor  . Insomnia   . Hemorrhoids   . Hiatal hernia   . Brain tumor     meningioma    Past Surgical History  Procedure Laterality Date  . Cataract extraction      left eye  . Tonsillectomy  1929  . Leg vein stripping  1970    Social History  reports that she has never smoked. She has never used smokeless tobacco. She reports that  she drinks alcohol. She reports that she does not use illicit drugs.  Family History family history includes Heart attack in her father; Heart failure in her brother.   Review of Systems  Constitutional: Negative.   HENT: Negative.   Eyes: Positive for visual disturbance.  Respiratory: Negative.   Cardiovascular: Positive for palpitations.  Gastrointestinal: Negative.   Musculoskeletal: Negative.   Skin: Negative.    Neurological: Negative.   Hematological: Negative.   Psychiatric/Behavioral: Negative.   All other systems reviewed and are negative.   BP 140/68 mmHg  Pulse 78  Ht 5\' 3"  (1.6 m)  Wt 138 lb (62.596 kg)  BMI 24.45 kg/m2  Physical Exam  Constitutional: She is oriented to person, place, and time. She appears well-developed and well-nourished.  HENT:  Head: Normocephalic.  Nose: Nose normal.  Mouth/Throat: Oropharynx is clear and moist.  Eyes: Conjunctivae are normal. Pupils are equal, round, and reactive to light.  Neck: Normal range of motion. Neck supple. No JVD present.  Cardiovascular: Normal rate, regular rhythm, S1 normal, S2 normal, normal heart sounds and intact distal pulses.  Exam reveals no gallop and no friction rub.   No murmur heard. Pulmonary/Chest: Effort normal and breath sounds normal. No respiratory distress. She has no wheezes. She has no rales. She exhibits no tenderness.  Abdominal: Soft. Bowel sounds are normal. She exhibits no distension. There is no tenderness.  Musculoskeletal: Normal range of motion. She exhibits no edema or tenderness.  Lymphadenopathy:    She has no cervical adenopathy.  Neurological: She is alert and oriented to person, place, and time. Coordination normal.  Skin: Skin is warm and dry. No rash noted. No erythema.  Psychiatric: She has a normal mood and affect. Her behavior is normal. Judgment and thought content normal.    Assessment and Plan  Nursing note and vitals reviewed.

## 2015-04-10 NOTE — Assessment & Plan Note (Signed)
She continues to have ectopy and is symptomatic. Recommended she take her beta blocker as needed for days when she has ectopy. Currently she's not taking any beta blockers on a regular basis. Unclear if this is because the  bystolic is expensive.

## 2015-04-29 DIAGNOSIS — H40051 Ocular hypertension, right eye: Secondary | ICD-10-CM | POA: Diagnosis not present

## 2015-06-14 ENCOUNTER — Ambulatory Visit: Payer: PRIVATE HEALTH INSURANCE | Admitting: Podiatry

## 2015-06-28 ENCOUNTER — Encounter: Payer: Self-pay | Admitting: Internal Medicine

## 2015-06-28 ENCOUNTER — Ambulatory Visit (INDEPENDENT_AMBULATORY_CARE_PROVIDER_SITE_OTHER): Payer: Medicare Other | Admitting: Internal Medicine

## 2015-06-28 VITALS — BP 148/72 | HR 78 | Temp 98.0°F | Wt 136.8 lb

## 2015-06-28 DIAGNOSIS — I6523 Occlusion and stenosis of bilateral carotid arteries: Secondary | ICD-10-CM | POA: Diagnosis not present

## 2015-06-28 DIAGNOSIS — Z0000 Encounter for general adult medical examination without abnormal findings: Secondary | ICD-10-CM | POA: Diagnosis not present

## 2015-06-28 DIAGNOSIS — I471 Supraventricular tachycardia, unspecified: Secondary | ICD-10-CM

## 2015-06-28 DIAGNOSIS — Z23 Encounter for immunization: Secondary | ICD-10-CM

## 2015-06-28 DIAGNOSIS — I6529 Occlusion and stenosis of unspecified carotid artery: Secondary | ICD-10-CM | POA: Diagnosis not present

## 2015-06-28 DIAGNOSIS — D329 Benign neoplasm of meninges, unspecified: Secondary | ICD-10-CM | POA: Diagnosis not present

## 2015-06-28 DIAGNOSIS — I10 Essential (primary) hypertension: Secondary | ICD-10-CM

## 2015-06-28 DIAGNOSIS — Z7189 Other specified counseling: Secondary | ICD-10-CM

## 2015-06-28 DIAGNOSIS — F39 Unspecified mood [affective] disorder: Secondary | ICD-10-CM

## 2015-06-28 LAB — COMPREHENSIVE METABOLIC PANEL
ALK PHOS: 54 U/L (ref 39–117)
ALT: 16 U/L (ref 0–35)
AST: 21 U/L (ref 0–37)
Albumin: 4.3 g/dL (ref 3.5–5.2)
BUN: 20 mg/dL (ref 6–23)
CO2: 33 meq/L — AB (ref 19–32)
Calcium: 9.5 mg/dL (ref 8.4–10.5)
Chloride: 99 mEq/L (ref 96–112)
Creatinine, Ser: 0.59 mg/dL (ref 0.40–1.20)
GFR: 101.11 mL/min (ref >60.00)
Glucose, Bld: 108 mg/dL — ABNORMAL HIGH (ref 70–99)
POTASSIUM: 4.4 meq/L (ref 3.5–5.1)
SODIUM: 137 meq/L (ref 135–145)
TOTAL PROTEIN: 6.9 g/dL (ref 6.0–8.3)
Total Bilirubin: 0.4 mg/dL (ref 0.2–1.2)

## 2015-06-28 LAB — CBC WITH DIFFERENTIAL/PLATELET
BASOS PCT: 0.4 % (ref 0.0–3.0)
Basophils Absolute: 0 10*3/uL (ref 0.0–0.1)
Eosinophils Absolute: 0.1 10*3/uL (ref 0.0–0.7)
Eosinophils Relative: 0.9 % (ref 0.0–5.0)
HCT: 43 % (ref 36.0–46.0)
HEMOGLOBIN: 14.2 g/dL (ref 12.0–15.0)
Lymphocytes Relative: 12.2 % (ref 12.0–46.0)
Lymphs Abs: 1.2 10*3/uL (ref 0.7–4.0)
MCHC: 33 g/dL (ref 30.0–36.0)
MCV: 90 fl (ref 78.0–100.0)
MONO ABS: 1 10*3/uL (ref 0.1–1.0)
Monocytes Relative: 10.6 % (ref 3.0–12.0)
Neutro Abs: 7.2 10*3/uL (ref 1.4–7.7)
Neutrophils Relative %: 75.9 % (ref 43.0–77.0)
PLATELETS: 678 10*3/uL — AB (ref 150.0–400.0)
RBC: 4.78 Mil/uL (ref 3.87–5.11)
RDW: 13.1 % (ref 11.5–15.5)
WBC: 9.4 10*3/uL (ref 4.0–10.5)

## 2015-06-28 LAB — LIPID PANEL
CHOLESTEROL: 153 mg/dL (ref 0–200)
HDL: 60.2 mg/dL (ref >39.00)
LDL CALC: 75 mg/dL (ref 0–99)
NonHDL: 92.8
Total CHOL/HDL Ratio: 3
Triglycerides: 90 mg/dL (ref 0.0–149.0)
VLDL: 18 mg/dL (ref 0.0–40.0)

## 2015-06-28 LAB — T4, FREE: Free T4: 0.94 ng/dL (ref 0.60–1.60)

## 2015-06-28 NOTE — Progress Notes (Signed)
Pre visit review using our clinic review tool, if applicable. No additional management support is needed unless otherwise documented below in the visit note. 

## 2015-06-28 NOTE — Assessment & Plan Note (Signed)
Mostly some anxiety from caregiving No Rx needed

## 2015-06-28 NOTE — Progress Notes (Signed)
Subjective:    Patient ID: Shelly Padilla, female    DOB: 1920/02/07, 81 y.o.   MRN: [id number]  HPI Here for Medicare wellness and follow up of chronic medical conditions Reviewed form and advanced directives Reviewed other doctors No tobacco or alcohol Very poor hearing Some decline in vision in past year---?early glaucoma (off and on meds) No exercise Independent with instrumental ADLs--doesn't drive No falls No depression or anhedonia Some memory problems   Doing okay in general Husband okay--she still does all the instrumental ADLs Doesn't drive Mood generally okay Stress at times and mild anxiety Still not sleeping at night--- hasn't really tried the trazodone regularly Has been having hot chocolate--asked her to just have warm milk  Meningioma known Has worsened hearing Loud roaring in head No headaches in general--- had 1 occipital headache a month ago which quickly resolved No focal weakness, no aphasia or dysphagia, no facial droop  Heart acts up at times Hasn't taken the propranolol Also stopped the bystolic--she felt it made her heart go too slow No dizziness or syncope No edema No chest pain or SOB  Current Outpatient Prescriptions on File Prior to Visit  Medication Sig Dispense Refill  . aspirin 81 MG tablet Take 81 mg by mouth daily.    . ramipril (ALTACE) 10 MG capsule Take 1 capsule (10 mg total) by mouth daily. 90 capsule 3  . traZODone (DESYREL) 50 MG tablet Take 1 tablet (50 mg total) by mouth at bedtime as needed for sleep. 30 tablet 1  . Multiple Minerals-Vitamins (CALCIUM-MAGNESIUM-ZINC) TABS Take by mouth daily.    . Multiple Vitamins-Minerals (CENTRUM SILVER PO) Take by mouth daily.    . nebivolol (BYSTOLIC) 5 MG tablet Take 1 tablet (5 mg total) by mouth daily. (Patient not taking: Reported on 06/28/2015) 30 tablet 11   No current facility-administered medications on file prior to visit.    Allergies  Allergen Reactions  . Codeine   .  Sulfa Drugs Cross Reactors     Past Medical History  Diagnosis Date  . Glaucoma   . HTN (hypertension)   . Atrial fibrillation   . History of ovarian cyst   . Palpitations   . H/O paroxysmal supraventricular tachycardia     documented by Holter Monitor  . Insomnia   . Hemorrhoids   . Hiatal hernia   . Brain tumor     meningioma    Past Surgical History  Procedure Laterality Date  . Cataract extraction      left eye  . Tonsillectomy  1929  . Leg vein stripping  1970    Family History  Problem Relation Age of Onset  . Heart attack Father   . Heart failure Brother     History   Social History  . Marital Status: Married    Spouse Name: N/A  . Number of Children: 1  . Years of Education: N/A   Occupational History  . retired     Network engineer   Social History Main Topics  . Smoking status: Never Smoker   . Smokeless tobacco: Never Used  . Alcohol Use: 0.0 oz/week    0 Standard drinks or equivalent per week     Comment: occasionally wine  . Drug Use: No  . Sexual Activity: Not on file   Other Topics Concern  . 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      Has living will   Son  is health care POA   Requests DNR   No tube feeds if cognitively unaware   Review of Systems Does still have pressure sensation in rectum--known hemorrhoids Bowels are okay No urinary problems Wears seat belt Teeth okay. Does note dry mouth at night Has annoying skin tags and moles    Objective:   Physical Exam  Constitutional: She is oriented to person, place, and time. She appears well-developed and well-nourished. No distress.  HENT:  Mouth/Throat: Oropharynx is clear and moist. No oropharyngeal exudate.  HOH even with the aides  Neck: Normal range of motion. Neck supple. No thyromegaly present.  Cardiovascular: Normal rate, regular rhythm, normal heart sounds and intact distal pulses.  Exam reveals no gallop.   No murmur  heard. Pulmonary/Chest: Effort normal and breath sounds normal. No respiratory distress. She has no wheezes. She has no rales.  Abdominal: Soft. There is no tenderness.  Musculoskeletal: She exhibits no tenderness.  Trace ankle edema  Lymphadenopathy:    She has no cervical adenopathy.  Neurological: She is alert and oriented to person, place, and time.  President -- "Obama, Bush, CLinton" [phone] [id number] Recall 1/3  Skin: No rash noted. No erythema.  Psychiatric: She has a normal mood and affect. Her behavior is normal.          Assessment & Plan:

## 2015-06-28 NOTE — Addendum Note (Signed)
Addended by: Jacqualin Combes on: 06/28/2015 11:48 AM   Modules accepted: Orders

## 2015-06-28 NOTE — Assessment & Plan Note (Signed)
Has DNR 

## 2015-06-28 NOTE — Assessment & Plan Note (Signed)
BP Readings from Last 3 Encounters:  06/28/15 148/72  04/10/15 140/68  01/30/15 160/84   Reasonable control Should be better back on the bystolic

## 2015-06-28 NOTE — Assessment & Plan Note (Signed)
occ spells I asked her to restart the bystolic

## 2015-06-28 NOTE — Assessment & Plan Note (Addendum)
I have personally reviewed the Medicare Annual Wellness questionnaire and have noted 1. The patient's medical and social history 2. Their use of alcohol, tobacco or illicit drugs 3. Their current medications and supplements 4. The patient's functional ability including ADL's, fall risks, home safety risks and hearing or visual             impairment. 5. Diet and physical activities 6. Evidence for depression or mood disorders  The patients weight, height, BMI and visual acuity have been recorded in the chart I have made referrals, counseling and provided education to the patient based review of the above and I have provided the pt with a written personalized care plan for preventive services.  I have provided you with a copy of your personalized plan for preventive services. Please take the time to review along with your updated medication list.  No cancer screening due to age Will give prevnar and Td Flu vaccine in season Urged her to start an exercise program

## 2015-06-28 NOTE — Assessment & Plan Note (Signed)
Small lesion noted last year No apparent neuro symptoms so will just observe

## 2015-06-28 NOTE — Assessment & Plan Note (Signed)
Mild On ASA only No neuro symptoms

## 2015-06-29 ENCOUNTER — Encounter: Payer: Self-pay | Admitting: Internal Medicine

## 2015-06-29 DIAGNOSIS — D75839 Thrombocytosis, unspecified: Secondary | ICD-10-CM | POA: Insufficient documentation

## 2015-06-29 DIAGNOSIS — D473 Essential (hemorrhagic) thrombocythemia: Secondary | ICD-10-CM | POA: Insufficient documentation

## 2015-07-02 ENCOUNTER — Ambulatory Visit (INDEPENDENT_AMBULATORY_CARE_PROVIDER_SITE_OTHER): Payer: Medicare Other | Admitting: Podiatry

## 2015-07-02 ENCOUNTER — Encounter: Payer: Self-pay | Admitting: Podiatry

## 2015-07-02 VITALS — BP 143/71 | HR 72 | Resp 16

## 2015-07-02 DIAGNOSIS — M79676 Pain in unspecified toe(s): Secondary | ICD-10-CM | POA: Diagnosis not present

## 2015-07-02 DIAGNOSIS — I6523 Occlusion and stenosis of bilateral carotid arteries: Secondary | ICD-10-CM

## 2015-07-02 DIAGNOSIS — B351 Tinea unguium: Secondary | ICD-10-CM | POA: Diagnosis not present

## 2015-07-08 NOTE — Progress Notes (Signed)
Subjective:     Patient ID: Shelly Padilla, female   DOB: 1920/02/08, 81 y.o.   MRN: [id number]  HPI 81 year old female presents the office today for painful, elongated toenails which isn't as it herself. Denies any redness or drainage on the nail sites. Nails are particularly irrigated in shoe gear. No other complaints at this time.  Review of Systems  All other systems reviewed and are negative.      Objective:   Physical Exam AAO x3, NAD DP/PT pulses palpable bilaterally, CRT less than 3 seconds Protective sensation intact with Simms Weinstein monofilament, Achilles tendon reflex intact Nails are hypertrophic, dystrophic, discolored, brittle, elongated 10. There is no swelling erythema or drainage. There is tenderness palpation Nails modified bilaterally. No other areas of tenderness to bilateral lower extremities.   No open lesions or pre-ulcerative lesions.  No overlying edema, erythema, increase in warmth to bilateral lower extremities.  No pain with calf compression, swelling, warmth, erythema bilaterally.      Assessment:     81 year old female with symptomatic onychomycosis    Plan:     -Treatment options discussed including all alternatives, risks, and complications -Nail sharply debrided 10 without complication/bleeding -Discussed importance daily foot inspection.If there are any changes to call the office immediately -Follow-up 3 months or sooner if any problems arise. In the meantime, encouraged to call the office with any questions, concerns, change in symptoms.   Celesta Gentile, DPM

## 2015-08-12 ENCOUNTER — Telehealth: Payer: Self-pay

## 2015-08-12 NOTE — Telephone Encounter (Signed)
Pt would like Bystolic samples. 

## 2015-08-13 NOTE — Telephone Encounter (Signed)
Samples at front desk for pick up. 

## 2015-09-06 ENCOUNTER — Encounter: Payer: Self-pay | Admitting: Internal Medicine

## 2015-09-06 ENCOUNTER — Ambulatory Visit (INDEPENDENT_AMBULATORY_CARE_PROVIDER_SITE_OTHER): Payer: Medicare Other | Admitting: Internal Medicine

## 2015-09-06 VITALS — BP 130/82 | HR 81 | Temp 98.2°F | Wt 134.0 lb

## 2015-09-06 DIAGNOSIS — I6523 Occlusion and stenosis of bilateral carotid arteries: Secondary | ICD-10-CM | POA: Diagnosis not present

## 2015-09-06 DIAGNOSIS — R1031 Right lower quadrant pain: Secondary | ICD-10-CM | POA: Diagnosis not present

## 2015-09-06 NOTE — Progress Notes (Signed)
Pre visit review using our clinic review tool, if applicable. No additional management support is needed unless otherwise documented below in the visit note. 

## 2015-09-06 NOTE — Patient Instructions (Signed)
Please see if you can find the MRI and CT reports on your abdomen.

## 2015-09-06 NOTE — Assessment & Plan Note (Signed)
May be related to gas Also had ovarian cysts found on ultrasound over a year ago--probably benign but repeat recommended She thinks she had CT and MRI after that--but I can't find them Also ongoing gas--she didn't follow the FODMAP diet Has caregiver stress with husband with dementia-- now on respite She will look for those reports--if can't find, may want to recheck ultrasound

## 2015-09-06 NOTE — Progress Notes (Signed)
Subjective:    Patient ID: Shelly Padilla, female    DOB: 1920/07/19, 81 y.o.   MRN: [id number]  HPI Here with neighbor Harriet  "I'm going down hilll" Has list  Having low back ache with pressure on her rectum Gets gas and this is embarrassing Didn't follow the FODMAP diet I gave her No sugarless candies/gum Hasn't tried beano--someone suggested that Some RLQ pain---Dawn Aline Brochure thought she might have hernia (but CT just showed benign tumors) Reviewed ultrasound--had probably benign cysts in ovary  Having trouble sleeping due to dry mouth But still drools Hasn't been using the trazodone lately--made her heart beat irregularly  Has noted more forgetfulness in past few months Misplaces notes Forgets why she came into a room Discussed her stress Husband in memory care for her to get respite  Current Outpatient Prescriptions on File Prior to Visit  Medication Sig Dispense Refill  . aspirin 81 MG tablet Take 81 mg by mouth daily.    . Multiple Minerals-Vitamins (CALCIUM-MAGNESIUM-ZINC) TABS Take by mouth daily.    . Multiple Vitamins-Minerals (CENTRUM SILVER PO) Take by mouth daily.    . ramipril (ALTACE) 10 MG capsule Take 1 capsule (10 mg total) by mouth daily. 90 capsule 3  . timolol (TIMOPTIC) 0.5 % ophthalmic solution      No current facility-administered medications on file prior to visit.    Allergies  Allergen Reactions  . Codeine   . Sulfa Drugs Cross Reactors     Past Medical History  Diagnosis Date  . Glaucoma   . HTN (hypertension)   . Atrial fibrillation   . History of ovarian cyst   . Palpitations   . H/O paroxysmal supraventricular tachycardia     documented by Holter Monitor  . Insomnia   . Hemorrhoids   . Hiatal hernia   . Brain tumor     meningioma  . Thrombocytosis     Past Surgical History  Procedure Laterality Date  . Cataract extraction      left eye  . Tonsillectomy  1929  . Leg vein stripping  1970    Family History    Problem Relation Age of Onset  . Heart attack Father   . Heart failure Brother     Social History   Social History  . Marital Status: Married    Spouse Name: N/A  . Number of Children: 1  . Years of Education: N/A   Occupational History  . retired     Network engineer   Social History Main Topics  . Smoking status: Never Smoker   . Smokeless tobacco: Never Used  . Alcohol Use: 0.0 oz/week    0 Standard drinks or equivalent per week     Comment: occasionally wine  . Drug Use: No  . Sexual Activity: Not on file   Other Topics Concern  . 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      Has living will   Son is health care POA   Requests DNR   No tube feeds if cognitively unaware   Review of Systems Appetite remains good Hasn't lost weight No N/V Some trouble with eyesight--trouble focusing. No unilateral vision loss or diplopia    Objective:   Physical Exam  Constitutional: She appears well-developed. No distress.  Neck: Normal range of motion. Neck supple.  Pulmonary/Chest: Effort normal and breath sounds normal. No respiratory distress. She has no wheezes. She has no rales.  Abdominal:  Soft. She exhibits no distension. There is no tenderness. There is no rebound and no guarding.  ?small ventral hernia in RLQ  Lymphadenopathy:    She has no cervical adenopathy.  Psychiatric:  Mildly anxious          Assessment & Plan:

## 2015-09-13 ENCOUNTER — Telehealth: Payer: Self-pay

## 2015-09-13 NOTE — Telephone Encounter (Signed)
Bystolic 5 mg placed at front desk for pick up.

## 2015-09-13 NOTE — Telephone Encounter (Signed)
Pt would like Bystolic Samples

## 2015-09-27 ENCOUNTER — Telehealth: Payer: Self-pay | Admitting: Internal Medicine

## 2015-09-27 NOTE — Telephone Encounter (Signed)
Pt brought in some records that Dr. Silvio Pate requested. It has been numbered and placed on the cart for distrubution. The best number to contact pt at is [phone].

## 2015-09-28 NOTE — Telephone Encounter (Signed)
I will review them when I return

## 2015-10-08 ENCOUNTER — Other Ambulatory Visit: Payer: Self-pay | Admitting: Cardiovascular Disease

## 2015-10-08 ENCOUNTER — Ambulatory Visit: Payer: Medicare Other

## 2015-10-08 ENCOUNTER — Other Ambulatory Visit: Payer: Self-pay

## 2015-10-08 ENCOUNTER — Encounter: Payer: Self-pay | Admitting: Sports Medicine

## 2015-10-08 ENCOUNTER — Ambulatory Visit (INDEPENDENT_AMBULATORY_CARE_PROVIDER_SITE_OTHER): Payer: Medicare Other | Admitting: Sports Medicine

## 2015-10-08 ENCOUNTER — Ambulatory Visit: Payer: Medicare Other | Admitting: Cardiovascular Disease

## 2015-10-08 DIAGNOSIS — M79676 Pain in unspecified toe(s): Secondary | ICD-10-CM

## 2015-10-08 DIAGNOSIS — B351 Tinea unguium: Secondary | ICD-10-CM

## 2015-10-08 MED ORDER — RAMIPRIL 10 MG PO CAPS: 10.0000 mg | ORAL_CAPSULE | Freq: Every day | ORAL | Status: DC

## 2015-10-08 NOTE — Progress Notes (Signed)
Patient ID: Shelly Padilla, female   DOB: 01/27/1920, 81 y.o.   MRN: [id number] Subjective: Shelly Padilla is a 81 y.o. female patient seen today in office with complaint of thickened and elongated toenails; unable to trim. Patient denies history of Diabetes, Neuropathy, or Vascular disease. Patient has no other pedal complaints at this time.   Patient Active Problem List   Diagnosis Date Noted  . RLQ abdominal pain 09/06/2015  . Thrombocytosis ([place])   . Preventative health care 06/28/2015  . Varicose veins 01/31/2015  . Advance directive discussed with patient 12/26/2014  . Carotid stenosis 10/01/2014  . Meningioma ([place]) 10/01/2014  . Vision changes 10/01/2014  . Insomnia 05/18/2014  . Episodic mood disorder ([place]) 10/18/2012  . Paroxysmal supraventricular tachycardia ([place]) 03/08/2012  . HTN (hypertension) 03/08/2012  . PVC's (premature ventricular contractions) 03/08/2012   Current Outpatient Prescriptions on File Prior to Visit  Medication Sig Dispense Refill  . aspirin 81 MG tablet Take 81 mg by mouth daily.    . Multiple Minerals-Vitamins (CALCIUM-MAGNESIUM-ZINC) TABS Take by mouth daily.    . Multiple Vitamins-Minerals (CENTRUM SILVER PO) Take by mouth daily.    . nebivolol (BYSTOLIC) 5 MG tablet Take 5 mg by mouth daily after supper.    . ramipril (ALTACE) 10 MG capsule Take 1 capsule (10 mg total) by mouth daily. 90 capsule 3  . timolol (TIMOPTIC) 0.5 % ophthalmic solution      No current facility-administered medications on file prior to visit.   Allergies  Allergen Reactions  . Codeine   . Pacerone  [Amiodarone Hcl] Nausea And Vomiting  . Sulfa Drugs Cross Reactors      Objective: Physical Exam  General: Well developed, nourished, no acute distress, awake, alert and oriented x 3  Vascular: Dorsalis pedis artery 1/4 bilateral, Posterior tibial artery 1/4 bilateral, skin temperature warm to warm proximal to distal bilateral lower extremities, + varicosities, no edema,  pedal hair present bilateral.  Neurological: Gross sensation present via light touch bilateral.   Dermatological: Skin is warm, dry, and supple bilateral, Nails 1-10 are tender, long, thick, and discolored with mild subungal debris, no webspace macerations present bilateral, no open lesions present bilateral, no callus/corns/hyperkeratotic tissue present bilateral. No signs of infection bilateral.  Musculoskeletal: No boney deformities noted bilateral. Muscular strength within normal limits without pain or limitation on range of motion. No pain with calf compression bilateral.  Assessment and Plan:  Problem List Items Addressed This Visit    None    Visit Diagnoses    Dermatophytosis of nail    -  Primary    Pain of toe, unspecified laterality          -Examined patient.  -Discussed treatment options for painful mycotic nails. -Mechanically debrided and reduced mycotic nails with sterile nail nipper and dremel nail file without incident. -Patient to return in 3 months for follow up evaluation or sooner if symptoms worsen.  Landis Martins, DPM

## 2015-10-09 ENCOUNTER — Encounter: Payer: Self-pay | Admitting: Cardiovascular Disease

## 2015-10-09 ENCOUNTER — Ambulatory Visit (INDEPENDENT_AMBULATORY_CARE_PROVIDER_SITE_OTHER): Payer: Medicare Other | Admitting: Cardiovascular Disease

## 2015-10-09 VITALS — BP 150/80 | HR 73 | Ht 63.0 in | Wt 133.5 lb

## 2015-10-09 DIAGNOSIS — I6523 Occlusion and stenosis of bilateral carotid arteries: Secondary | ICD-10-CM

## 2015-10-09 DIAGNOSIS — I6529 Occlusion and stenosis of unspecified carotid artery: Secondary | ICD-10-CM | POA: Diagnosis not present

## 2015-10-09 DIAGNOSIS — I10 Essential (primary) hypertension: Secondary | ICD-10-CM

## 2015-10-09 DIAGNOSIS — I493 Ventricular premature depolarization: Secondary | ICD-10-CM

## 2015-10-09 DIAGNOSIS — I471 Supraventricular tachycardia: Secondary | ICD-10-CM | POA: Diagnosis not present

## 2015-10-09 NOTE — Assessment & Plan Note (Signed)
Mild bilateral carotid stenosis. Cholesterol 120 in the past   

## 2015-10-09 NOTE — Progress Notes (Signed)
Patient ID: Shelly Padilla, female    DOB: 1920/08/04, 81 y.o.   MRN: [id number]  HPI Comments: 81 year old female with history of hypertension , insomnia , SVT, APCs and PVCs that are symptomatic with previous trial of diltiazem, metoprolol, amiodarone who presents for routine followup of her palpitations and blood pressure  .  In follow up today she reports that she is doing well She complains of hearing problems, memory problems. Husband with Alzheimer's, goes to daycare 4 days per week  no recent falls. Previously was not taking her bystolic. She has been taking this on a more regular basis Reports blood pressures typically well controlled  EKG on today's visit shows normal sinus rhythm with rate 73 bpm, right bundle branch block  Other past medical history  She takes care of her husband who has Alzheimer's. He is in  memory daycare several days per week Recent hospitalization August 20 with discharge 08/03/2014 with vision changes, diagnosed with right temporoparietal meningioma 2 x 2 centimeters  She had echocardiogram 08/03/2014 showing normal ejection fraction CT scan of the head showing atrophy and chronic small vessel disease MRI of the brain showing 2 cm right posterior temporal meningioma Carotid ultrasound showing mild bilateral atherosclerosis  Lab work showing total cholesterol 120, LDL 59 Medical management was recommended for her meningioma  Per the patient. She has followup neurosurgery  Holter monitor November 2011 showed paroxysmal SVT.  Allergies  Allergen Reactions  . Codeine   . Pacerone  [Amiodarone Hcl] Nausea And Vomiting  . Sulfa Drugs Cross Reactors     Outpatient Encounter Prescriptions as of 10/09/2015  Medication Sig  . aspirin 81 MG tablet Take 81 mg by mouth daily.  . Multiple Minerals-Vitamins (CALCIUM-MAGNESIUM-ZINC) TABS Take by mouth daily.  . Multiple Vitamins-Minerals (CENTRUM SILVER PO) Take by mouth daily.  . nebivolol (BYSTOLIC) 5 MG  tablet Take 5 mg by mouth daily after supper.  . ramipril (ALTACE) 10 MG capsule Take 1 capsule (10 mg total) by mouth daily.  . timolol (TIMOPTIC) 0.5 % ophthalmic solution    No facility-administered encounter medications on file as of 10/09/2015.    Past Medical History  Diagnosis Date  . Glaucoma   . HTN (hypertension)   . Atrial fibrillation ([place])   . History of ovarian cyst   . Palpitations   . H/O paroxysmal supraventricular tachycardia     documented by Holter Monitor  . Insomnia   . Hemorrhoids   . Hiatal hernia   . Brain tumor ([place])     meningioma  . Thrombocytosis [facility])     Past Surgical History  Procedure Laterality Date  . Cataract extraction      left eye  . Tonsillectomy  1929  . Leg vein stripping  1970    Social History  reports that she has never smoked. She has never used smokeless tobacco. She reports that she drinks alcohol. She reports that she does not use illicit drugs.  Family History family history includes Heart attack in her father; Heart failure in her brother.   Review of Systems  Constitutional: Negative.   Eyes: Positive for visual disturbance.  Respiratory: Negative.   Cardiovascular: Negative.   Gastrointestinal: Negative.   Musculoskeletal: Positive for gait problem.  Skin: Negative.   Neurological: Negative.   Hematological: Negative.   Psychiatric/Behavioral: Negative.   All other systems reviewed and are negative.   BP 150/80 mmHg  Pulse 73  Ht 5\' 3"  (1.6 m)  Wt 133 lb 8 oz (  60.555 kg)  BMI 23.65 kg/m2  Physical Exam  Constitutional: She is oriented to person, place, and time. She appears well-developed and well-nourished.  HENT:  Head: Normocephalic.  Nose: Nose normal.  Mouth/Throat: Oropharynx is clear and moist.  Eyes: Conjunctivae are normal. Pupils are equal, round, and reactive to light.  Neck: Normal range of motion. Neck supple. No JVD present.  Cardiovascular: Normal rate, regular rhythm, S1 normal, S2  normal, normal heart sounds and intact distal pulses.  Exam reveals no gallop and no friction rub.   No murmur heard. Pulmonary/Chest: Effort normal and breath sounds normal. No respiratory distress. She has no wheezes. She has no rales. She exhibits no tenderness.  Abdominal: Soft. Bowel sounds are normal. She exhibits no distension. There is no tenderness.  Musculoskeletal: Normal range of motion. She exhibits no edema or tenderness.  Lymphadenopathy:    She has no cervical adenopathy.  Neurological: She is alert and oriented to person, place, and time. Coordination normal.  Skin: Skin is warm and dry. No rash noted. No erythema.  Psychiatric: She has a normal mood and affect. Her behavior is normal. Judgment and thought content normal.    Assessment and Plan  Nursing note and vitals reviewed.

## 2015-10-09 NOTE — Assessment & Plan Note (Signed)
She denies any recent episodes of tachycardia concerning for SVT No medication changes made, we'll continue low-dose beta blocker Samples provided

## 2015-10-09 NOTE — Patient Instructions (Addendum)
You are doing well. No medication changes were made.  Please call us if you have new issues that need to be addressed before your next appt.  Your physician wants you to follow-up in: 12 months.  You will receive a reminder letter in the mail two months in advance. If you don't receive a letter, please call our office to schedule the follow-up appointment. 

## 2015-10-09 NOTE — Assessment & Plan Note (Signed)
Repeat blood pressure improved down to 431 systolic. No medication changes made

## 2015-10-09 NOTE — Assessment & Plan Note (Signed)
Prior history of PVCs. We'll continue low-dose beta blocker. She is asymptomatic

## 2015-10-10 NOTE — Telephone Encounter (Signed)
Spoke with patient and advised results ( see scanned records)

## 2015-10-28 DIAGNOSIS — H40051 Ocular hypertension, right eye: Secondary | ICD-10-CM | POA: Diagnosis not present

## 2015-12-27 ENCOUNTER — Telehealth: Payer: Self-pay

## 2015-12-27 NOTE — Telephone Encounter (Signed)
Pt would like Bystolic samples. 

## 2015-12-27 NOTE — Telephone Encounter (Signed)
Placed samples of Bystolic 5 mg at front desk for pick up.

## 2016-01-07 NOTE — Progress Notes (Signed)
Cardiology Office Note Date:  01/08/2016  Patient ID:  Padilla, Shelly 10/17/1920, MRN [id number] PCP:  Viviana Simpler, MD  Cardiologist:  Dr. Rockey Situ, MD    Chief Complaint: Palpitations  History of Present Illness: Shelly Padilla is a 82 y.o. female with history of HTN, symptomatic PVC and PACs previously on diltiazem, metoprolol, and amiodarone who presents for follow up of palpitations.  Admited in 2015 with vision changes. CT head showed chronic small vessel disease, MRI brain showed 2 cm right posterior temporal meningioma, carotid ultrasouns with mild bilateral atherosclerosis. Echo 2015 showed normal EF at 60%, no RWMA, no valvular abnormalities. Increased stress with taking care of her husband who has Alzheimer's. Has previously not been taking her Bystolic which led to increased BP. At her last follow up in October she denied any tachy-palpitations. She was restarted on her beta blocker. She was asymptomatic. No recent labs for review.   She has been experiencing increased tachy-palpitations for the past 1-2 weeks at night time when she has "time to think." She initially noted chest pressure, but subsequently states this was her palpitations. She has been taking Bystolic 2.5 mg every other day rather than 5 mg daily as is documented in her chart. No increased SOB, orthopnea, LEE, PND, or early satiety. She continues to have increaed stress at home with her husband's Alzheimer's disease.     Past Medical History  Diagnosis Date  . Glaucoma   . HTN (hypertension)   . Atrial fibrillation ([place])   . History of ovarian cyst   . Palpitations   . H/O paroxysmal supraventricular tachycardia     documented by Holter Monitor  . Insomnia   . Hemorrhoids   . Hiatal hernia   . Brain tumor ([place])     meningioma  . Thrombocytosis [facility])     Past Surgical History  Procedure Laterality Date  . Cataract extraction      left eye  . Tonsillectomy  1929  . Leg vein stripping  1970     Current Outpatient Prescriptions  Medication Sig Dispense Refill  . aspirin 81 MG tablet Take 81 mg by mouth daily.    . Multiple Minerals-Vitamins (CALCIUM-MAGNESIUM-ZINC) TABS Take by mouth daily.    . Multiple Vitamins-Minerals (CENTRUM SILVER PO) Take by mouth daily.    . ramipril (ALTACE) 10 MG capsule Take 1 capsule (10 mg total) by mouth daily. 90 capsule 3  . timolol (TIMOPTIC) 0.5 % ophthalmic solution     . nebivolol (BYSTOLIC) 5 MG tablet Take 1 tablet (5 mg total) by mouth daily. 30 tablet 3   No current facility-administered medications for this visit.    Allergies:   Codeine; Pacerone ; and Sulfa drugs cross reactors   Social History:  The patient  reports that she has never smoked. She has never used smokeless tobacco. She reports that she drinks alcohol. She reports that she does not use illicit drugs.   Family History:  The patient's family history includes Heart attack in her father; Heart failure in her brother.  ROS:   Review of Systems  Constitutional: Positive for malaise/fatigue. Negative for fever, chills, weight loss and diaphoresis.  HENT: Negative for congestion.   Eyes: Negative for discharge and redness.  Respiratory: Negative for cough, hemoptysis, sputum production, shortness of breath and wheezing.   Cardiovascular: Positive for palpitations. Negative for chest pain, orthopnea, claudication, leg swelling and PND.  Gastrointestinal: Negative for nausea, vomiting and abdominal pain.  Musculoskeletal: Negative for falls.  Skin: Negative for rash.  Neurological: Negative for sensory change, speech change, focal weakness, loss of consciousness and weakness.  Endo/Heme/Allergies: Does not bruise/bleed easily.  Psychiatric/Behavioral: Negative for substance abuse. The patient is not nervous/anxious.   All other systems reviewed and are negative.    PHYSICAL EXAM:  VS:  BP 168/88 mmHg  Pulse 74  Ht 5' 3.5" (1.613 m)  Wt 133 lb 4 oz (60.442 kg)   BMI 23.23 kg/m2 BMI: Body mass index is 23.23 kg/(m^2). Well nourished, well developed, in no acute distress HEENT: normocephalic, atraumatic Neck: no JVD, carotid bruits or masses Cardiac:  normal S1, S2; RRR; no murmurs, rubs, or gallops Lungs:  clear to auscultation bilaterally, no wheezing, rhonchi or rales Abd: soft, nontender, no hepatomegaly, + BS MS: no deformity or atrophy Ext: no edema Skin: warm and dry, no rash Neuro:  moves all extremities spontaneously, no focal abnormalities noted, follows commands Psych: euthymic mood, full affect   EKG:  Was ordered today. Shows NSR, 74 bpm, left axis deviation, RBBB  Recent Labs: 06/28/2015: ALT 16; BUN 20; Creatinine, Ser 0.59; Hemoglobin 14.2; Platelets 678.0*; Potassium 4.4; Sodium 137  06/28/2015: Cholesterol 153; HDL 60.20; LDL Cholesterol 75; Total CHOL/HDL Ratio 3; Triglycerides 90.0; VLDL 18.0   CrCl cannot be calculated (Patient has no serum creatinine result on file.).   Wt Readings from Last 3 Encounters:  01/08/16 133 lb 4 oz (60.442 kg)  10/09/15 133 lb 8 oz (60.555 kg)  09/06/15 134 lb (60.782 kg)     Other studies reviewed: Additional studies/records reviewed today include: summarized above  ASSESSMENT AND PLAN:  1. Palpitations: -Has been taking Bystolic 2.5 mg every other day -Increase Bystolic to 5 mg daily in an effort to decrease PVCs and PACs as well as to obtain better BP control -Check bmet   2. HTN: -Increase Bystolic as above -Continue Altace 10 mg daily  -Under increased stress with her husband's Alzheimer's disease  -She would like to start taking naps in the afternoon   Disposition: F/u with Dr. Rockey Situ, MD in 3 months  Current medicines are reviewed at length with the patient today.  The patient did not have any concerns regarding medicines.  Melvern Banker PA-C 01/08/2016 12:16 PM     [address] [place], [place] [address] [phone]

## 2016-01-08 ENCOUNTER — Encounter: Payer: Self-pay | Admitting: Physician Assistant

## 2016-01-08 ENCOUNTER — Ambulatory Visit (INDEPENDENT_AMBULATORY_CARE_PROVIDER_SITE_OTHER): Payer: Medicare Other | Admitting: Physician Assistant

## 2016-01-08 VITALS — BP 168/88 | HR 74 | Ht 63.5 in | Wt 133.2 lb

## 2016-01-08 DIAGNOSIS — I471 Supraventricular tachycardia: Secondary | ICD-10-CM

## 2016-01-08 DIAGNOSIS — R002 Palpitations: Secondary | ICD-10-CM | POA: Diagnosis not present

## 2016-01-08 DIAGNOSIS — I493 Ventricular premature depolarization: Secondary | ICD-10-CM

## 2016-01-08 DIAGNOSIS — I10 Essential (primary) hypertension: Secondary | ICD-10-CM | POA: Diagnosis not present

## 2016-01-08 MED ORDER — NEBIVOLOL HCL 5 MG PO TABS: 5.0000 mg | ORAL_TABLET | Freq: Every day | ORAL | Status: DC

## 2016-01-08 NOTE — Patient Instructions (Signed)
Medication Instructions:  Your physician has recommended you make the following change in your medication:  TAKE bystolic 5mg  once a day   Labwork: BMET  Testing/Procedures: none  Follow-Up: Your physician recommends that you schedule a follow-up appointment in: 3 months with Dr. Rockey Situ   Any Other Special Instructions Will Be Listed Below (If Applicable). Please have staff at [facility] check your blood pressure early next week and report reading to [place], [phone]    If you need a refill on your cardiac medications before your next appointment, please call your pharmacy.

## 2016-01-09 LAB — BASIC METABOLIC PANEL
BUN/Creatinine Ratio: 31 — ABNORMAL HIGH (ref 11–26)
BUN: 17 mg/dL (ref 10–36)
CALCIUM: 9.3 mg/dL (ref 8.7–10.3)
CO2: 25 mmol/L (ref 18–29)
CREATININE: 0.54 mg/dL — AB (ref 0.57–1.00)
Chloride: 97 mmol/L (ref 96–106)
GFR calc Af Amer: 94 mL/min/{1.73_m2} (ref >59)
GFR, EST NON AFRICAN AMERICAN: 82 mL/min/{1.73_m2} (ref >59)
Glucose: 93 mg/dL (ref 65–99)
Potassium: 4.9 mmol/L (ref 3.5–5.2)
Sodium: 139 mmol/L (ref 134–144)

## 2016-01-10 ENCOUNTER — Ambulatory Visit (INDEPENDENT_AMBULATORY_CARE_PROVIDER_SITE_OTHER): Payer: Medicare Other | Admitting: Sports Medicine

## 2016-01-10 ENCOUNTER — Encounter: Payer: Self-pay | Admitting: Sports Medicine

## 2016-01-10 DIAGNOSIS — M79676 Pain in unspecified toe(s): Secondary | ICD-10-CM

## 2016-01-10 DIAGNOSIS — B351 Tinea unguium: Secondary | ICD-10-CM | POA: Diagnosis not present

## 2016-01-10 NOTE — Progress Notes (Signed)
Patient ID: Marguerita Steed, female   DOB: May 23, 1920, 82 y.o.   MRN: [id number] Subjective: Baxley Tullo is a 82 y.o. female patient seen today in office with complaint of thickened and elongated toenails; unable to trim. Patient denies history of Diabetes, Neuropathy, or Vascular disease. Patient has no other pedal complaints at this time.   Patient Active Problem List   Diagnosis Date Noted  . RLQ abdominal pain 09/06/2015  . Thrombocytosis ([place])   . Preventative health care 06/28/2015  . Varicose veins 01/31/2015  . Advance directive discussed with patient 12/26/2014  . Carotid stenosis 10/01/2014  . Meningioma ([place]) 10/01/2014  . Vision changes 10/01/2014  . Insomnia 05/18/2014  . Episodic mood disorder ([place]) 10/18/2012  . Paroxysmal supraventricular tachycardia ([place]) 03/08/2012  . HTN (hypertension) 03/08/2012  . PVC's (premature ventricular contractions) 03/08/2012   Current Outpatient Prescriptions on File Prior to Visit  Medication Sig Dispense Refill  . aspirin 81 MG tablet Take 81 mg by mouth daily.    . Multiple Minerals-Vitamins (CALCIUM-MAGNESIUM-ZINC) TABS Take by mouth daily.    . Multiple Vitamins-Minerals (CENTRUM SILVER PO) Take by mouth daily.    . nebivolol (BYSTOLIC) 5 MG tablet Take 1 tablet (5 mg total) by mouth daily. 30 tablet 3  . ramipril (ALTACE) 10 MG capsule Take 1 capsule (10 mg total) by mouth daily. 90 capsule 3  . timolol (TIMOPTIC) 0.5 % ophthalmic solution      No current facility-administered medications on file prior to visit.   Allergies  Allergen Reactions  . Codeine   . Pacerone  [Amiodarone Hcl] Nausea And Vomiting  . Sulfa Drugs Cross Reactors      Objective: Physical Exam  General: Well developed, nourished, no acute distress, awake, alert and oriented x 3  Vascular: Dorsalis pedis artery 1/4 bilateral, Posterior tibial artery 1/4 bilateral, skin temperature warm to warm proximal to distal bilateral lower extremities, +  varicosities, no edema, pedal hair present bilateral.  Neurological: Gross sensation present via light touch bilateral.   Dermatological: Skin is warm, dry, and supple bilateral, Nails 1-10 are tender, long, thick, and discolored with mild subungal debris, no webspace macerations present bilateral, no open lesions present bilateral, no callus/corns/hyperkeratotic tissue present bilateral. No signs of infection bilateral.  Musculoskeletal: Mild asymptomatic hammertoes and distal fat pad migration noted bilateral. Muscular strength within normal limits without pain or limitation on range of motion. No pain with calf compression bilateral.  Assessment and Plan:  Problem List Items Addressed This Visit    None    Visit Diagnoses    Dermatophytosis of nail    -  Primary    Pain of toe, unspecified laterality          -Examined patient.  -Discussed treatment options for painful mycotic nails. -Mechanically debrided and reduced mycotic nails with sterile nail nipper and dremel nail file without incident. - Gave patient metatarsal pad to use as needed to help coushion the ball of her foot and to take some of the pressure off her hammertoes - Recommend good supportive shoes for foot type daily -Patient to return in 3 months for follow up evaluation or sooner if symptoms worsen.  Landis Martins, DPM

## 2016-01-14 ENCOUNTER — Telehealth: Payer: Self-pay | Admitting: Physician Assistant

## 2016-01-14 NOTE — Telephone Encounter (Signed)
Pt only has 1 reading to report.

## 2016-01-14 NOTE — Telephone Encounter (Signed)
Ok to continue current dose of Bystolic.

## 2016-01-14 NOTE — Telephone Encounter (Signed)
Spoke w/ pt.  Advised her of Ryan's recommendation.  She verbalizes understanding and will call back w/ any questions or concerns.  

## 2016-01-14 NOTE — Telephone Encounter (Signed)
Pt c/o BP issue: STAT if pt c/o blurred vision, one-sided weakness or slurred speech  1. What are your last 5 BP readings?   142/68 today   2. Are you having any other symptoms ?   No   3. What is your BP issue?   Ryan told her to report today's bp

## 2016-02-19 ENCOUNTER — Emergency Department
Admission: EM | Admit: 2016-02-19 | Discharge: 2016-02-19 | Disposition: A | Discharge status: Home / Self Care | Payer: Medicare Other | Attending: Emergency Medicine | Admitting: Emergency Medicine

## 2016-02-19 ENCOUNTER — Emergency Department: Payer: Medicare Other

## 2016-02-19 ENCOUNTER — Encounter: Payer: Self-pay | Admitting: Emergency Medicine

## 2016-02-19 DIAGNOSIS — W101XXA Fall (on)(from) sidewalk curb, initial encounter: Secondary | ICD-10-CM | POA: Insufficient documentation

## 2016-02-19 DIAGNOSIS — I10 Essential (primary) hypertension: Secondary | ICD-10-CM | POA: Insufficient documentation

## 2016-02-19 DIAGNOSIS — Z7982 Long term (current) use of aspirin: Secondary | ICD-10-CM | POA: Insufficient documentation

## 2016-02-19 DIAGNOSIS — Z79899 Other long term (current) drug therapy: Secondary | ICD-10-CM | POA: Diagnosis not present

## 2016-02-19 DIAGNOSIS — S0031XA Abrasion of nose, initial encounter: Secondary | ICD-10-CM | POA: Insufficient documentation

## 2016-02-19 DIAGNOSIS — Y9289 Other specified places as the place of occurrence of the external cause: Secondary | ICD-10-CM | POA: Insufficient documentation

## 2016-02-19 DIAGNOSIS — S0990XA Unspecified injury of head, initial encounter: Secondary | ICD-10-CM

## 2016-02-19 DIAGNOSIS — Y998 Other external cause status: Secondary | ICD-10-CM | POA: Insufficient documentation

## 2016-02-19 DIAGNOSIS — Z880 Allergy status to penicillin: Secondary | ICD-10-CM | POA: Insufficient documentation

## 2016-02-19 DIAGNOSIS — S199XXA Unspecified injury of neck, initial encounter: Secondary | ICD-10-CM | POA: Diagnosis not present

## 2016-02-19 DIAGNOSIS — S0992XA Unspecified injury of nose, initial encounter: Secondary | ICD-10-CM | POA: Diagnosis present

## 2016-02-19 DIAGNOSIS — Y9301 Activity, walking, marching and hiking: Secondary | ICD-10-CM | POA: Diagnosis not present

## 2016-02-19 DIAGNOSIS — W101XXS Fall (on)(from) sidewalk curb, sequela: Secondary | ICD-10-CM

## 2016-02-19 DIAGNOSIS — S022XXA Fracture of nasal bones, initial encounter for closed fracture: Secondary | ICD-10-CM

## 2016-02-19 MED ORDER — ACETAMINOPHEN 325 MG PO TABS
650.0000 mg | ORAL_TABLET | Freq: Once | ORAL | Status: AC
  Administered 2016-02-19: 650 mg via ORAL
  Filled 2016-02-19: qty 2

## 2016-02-19 NOTE — ED Provider Notes (Addendum)
[facility] Emergency Department Provider Note  ____________________________________________   I have reviewed the triage vital signs and the nursing notes.   HISTORY  Chief Complaint Fall    HPI Shelly Padilla is a 82 y.o. female who is not on any Coumadin or blood thinners aside from aspirin, was walking today had a non-syncopal fall. She tripped over the curb. She feels her only injury is to her face. She has a scuff mark to her forehead and she believes she may have broken her nose. She did not pass out. She has no neck pain. She states that she landed on her knees but they are definitely not broken in her opinion nor does she feel that any imaging is no history of her upper extremities. She had no abdominal pain nausea vomiting chest pain headache shortness of breath or any other complaint prior or subsequent to the fall.  Past Medical History  Diagnosis Date  . Glaucoma   . HTN (hypertension)   . Atrial fibrillation ([place])   . History of ovarian cyst   . Palpitations   . H/O paroxysmal supraventricular tachycardia     documented by Holter Monitor  . Insomnia   . Hemorrhoids   . Hiatal hernia   . Brain tumor ([place])     meningioma  . Thrombocytosis [facility])     Patient Active Problem List   Diagnosis Date Noted  . RLQ abdominal pain 09/06/2015  . Thrombocytosis ([place])   . Preventative health care 06/28/2015  . Varicose veins 01/31/2015  . Advance directive discussed with patient 12/26/2014  . Carotid stenosis 10/01/2014  . Meningioma ([place]) 10/01/2014  . Vision changes 10/01/2014  . Insomnia 05/18/2014  . Episodic mood disorder ([place]) 10/18/2012  . Paroxysmal supraventricular tachycardia ([place]) 03/08/2012  . HTN (hypertension) 03/08/2012  . PVC's (premature ventricular contractions) 03/08/2012    Past Surgical History  Procedure Laterality Date  . Cataract extraction      left eye  . Tonsillectomy  1929  . Leg vein stripping  1970     Current Outpatient Rx  Name  Route  Sig  Dispense  Refill  . aspirin 81 MG tablet   Oral   Take 81 mg by mouth daily.         . Multiple Minerals-Vitamins (CALCIUM-MAGNESIUM-ZINC) TABS   Oral   Take by mouth daily.         . Multiple Vitamins-Minerals (CENTRUM SILVER PO)   Oral   Take by mouth daily.         . nebivolol (BYSTOLIC) 5 MG tablet   Oral   Take 1 tablet (5 mg total) by mouth daily.   30 tablet   3   . ramipril (ALTACE) 10 MG capsule   Oral   Take 1 capsule (10 mg total) by mouth daily.   90 capsule   3   . timolol (TIMOPTIC) 0.5 % ophthalmic solution                 Allergies Amoxil; Codeine; Pacerone ; and Sulfa drugs cross reactors  Family History  Problem Relation Age of Onset  . Heart attack Father   . Heart failure Brother     Social History Social History  Substance Use Topics  . Smoking status: Never Smoker   . Smokeless tobacco: Never Used  . Alcohol Use: No     Comment: occasionally wine    Review of Systems Constitutional: No fever/chills Eyes: No visual changes. ENT: No  sore throat. No stiff neck no neck pain Cardiovascular: Denies chest pain. Respiratory: Denies shortness of breath. Gastrointestinal:   no vomiting.  No diarrhea.  No constipation. Genitourinary: Negative for dysuria. Musculoskeletal: Negative lower extremity swelling Skin: Negative for rash. Neurological: Negative for headaches, focal weakness or numbness. 10-point ROS otherwise negative.  ____________________________________________   PHYSICAL EXAM:  VITAL SIGNS: ED Triage Vitals  Enc Vitals Group     BP 02/19/16 1228 188/82 mmHg     Pulse Rate 02/19/16 1228 72     Resp 02/19/16 1228 20     Temp 02/19/16 1228 98.3 F (36.8 C)     Temp Source 02/19/16 1228 Oral     SpO2 02/19/16 1228 100 %     Weight 02/19/16 1228 134 lb (60.782 kg)     Height 02/19/16 1228 5\' 3"  (1.6 m)     Head Cir --      Peak Flow --      Pain Score 02/19/16  1234 3     Pain Loc --      Pain Edu? --      Excl. in [place]? --     Constitutional: Alert and oriented. Well appearing and in no acute distress. Eyes: Conjunctivae are normal. PERRL. EOMI. Head: Abrasion to forehead and to nose. Nose: Swollen to the bridge of the nose with light abrasion noted, there is blood in bilateral nares but no septal hematoma Mouth/Throat: Mucous membranes are moist.  Oropharynx non-erythematous. Neck: No stridor.   Nontender with no meningismus Cardiovascular: Normal rate, regular rhythm. Grossly normal heart sounds.  Good peripheral circulation. Respiratory: Normal respiratory effort.  No retractions. Lungs CTAB. Abdominal: Soft and nontender. No distention. No guarding no rebound Back:  There is no focal tenderness or step off there is no midline tenderness there are no lesions noted. there is no CVA tenderness Musculoskeletal: No lower extremity tenderness. No joint effusions, no DVT signs strong distal pulses no edema, patient has no evidence of fracture to either extremity on the upper or lower part of her body. She has full painless range of motion of all joints, with no effusion or evidence of fracture. Neurologic:  Normal speech and language. No gross focal neurologic deficits are appreciated.  Skin:  Skin is warm, dry and intact. See above. Psychiatric: Mood and affect are normal. Speech and behavior are normal.  ____________________________________________   LABS (all labs ordered are listed, but only abnormal results are displayed)  Labs Reviewed - No data to display ____________________________________________  EKG  I personally interpreted any EKGs ordered by me or triage  ____________________________________________  RADIOLOGY  I reviewed any imaging ordered by me or triage that were performed during my shift and, if possible, patient and/or family made aware of any abnormal  findings. ____________________________________________   PROCEDURES  Procedure(s) performed: None  Critical Care performed: None  ____________________________________________   INITIAL IMPRESSION / ASSESSMENT AND PLAN / ED COURSE  Pertinent labs & imaging results that were available during my care of the patient were reviewed by me and considered in my medical decision making (see chart for details).  Patient had a non-syncopal fall today where she sustained a nasal bone fracture she has no evidence of acute concussion CT is negative. Patient has no evidence of extremity injury at this time and she is insisting on discharge at this time because her husband has Alzheimer's and is at home alone. She is adamant that nothing requires x-rays. She will not stay for x-rays even  if they did. Fortunately, , there is no indication at this time for extremity x-rays as she has no evidence of fracture. CT of the cervical spine is reassuring. She does have nasal bone fractures. She is otherwise neurologically intact with no evidence of significant concussion or other traumatic injury. We will discharge her home with pain medications and close follow-up with ENT. Patient is at her neurologic baseline according to family. Blood pressure is elevated the patient is anxious, uncomfortable and eager to go home. I have advised the family to have that rechecked. ________________________________________  ----------------------------------------- 3:58 PM on 02/19/2016 -----------------------------------------  Patient able to ambulate with no difficulty.____   FINAL CLINICAL IMPRESSION(S) / ED DIAGNOSES  Final diagnoses:  Nasal bone fracture, closed, initial encounter  Closed head injury, initial encounter  Fall (on)(from) sidewalk curb,      This chart was dictated using voice recognition software.  Despite best efforts to proofread,  errors can occur which can change meaning.     Schuyler Amor,  MD 02/19/16 1544  Schuyler Amor, MD 02/19/16 [phone]

## 2016-02-19 NOTE — ED Notes (Signed)
Pt was walking into a restaurant when she tripped over a curb and fell.  Pt denies shortness of breath, dizziness or chest pain.  Pt with multiple abrasions on her nose and one on her mid forehead.  Pt c/o back of head pain.  C-collar applied during triage.  Pt also hit her bilateral knees and complains of slight R hand pain.

## 2016-02-19 NOTE — Discharge Instructions (Signed)
Please follow closely with the ENT doctor for your nose. Take Tylenol as needed for the pain. You would prefer not to stay for blood work and further xrays, this is certainly not unreasonable but it limits our ability to further evaluate you. We notice that your blood pressure is elevated. Please see your doctor tomorrow to have it rechecked. If you have increased pain, numbness, vomiting, weakness, severe headache, any joint pain that appears to be concerning to you, chest pain or shortness of breath or you feel worse in any way return to the emergency department.

## 2016-02-20 DIAGNOSIS — S022XXA Fracture of nasal bones, initial encounter for closed fracture: Secondary | ICD-10-CM | POA: Diagnosis not present

## 2016-02-20 DIAGNOSIS — W19XXXA Unspecified fall, initial encounter: Secondary | ICD-10-CM | POA: Diagnosis not present

## 2016-03-13 DIAGNOSIS — H40051 Ocular hypertension, right eye: Secondary | ICD-10-CM | POA: Diagnosis not present

## 2016-03-18 ENCOUNTER — Encounter: Payer: Self-pay | Admitting: Internal Medicine

## 2016-03-18 ENCOUNTER — Ambulatory Visit (INDEPENDENT_AMBULATORY_CARE_PROVIDER_SITE_OTHER): Payer: Medicare Other | Admitting: Internal Medicine

## 2016-03-18 VITALS — BP 160/80 | HR 78 | Temp 97.9°F | Wt 131.0 lb

## 2016-03-18 DIAGNOSIS — R42 Dizziness and giddiness: Secondary | ICD-10-CM | POA: Diagnosis not present

## 2016-03-18 DIAGNOSIS — Z636 Dependent relative needing care at home: Secondary | ICD-10-CM

## 2016-03-18 DIAGNOSIS — N83202 Unspecified ovarian cyst, left side: Secondary | ICD-10-CM

## 2016-03-18 DIAGNOSIS — I10 Essential (primary) hypertension: Secondary | ICD-10-CM

## 2016-03-18 DIAGNOSIS — N83201 Unspecified ovarian cyst, right side: Secondary | ICD-10-CM | POA: Diagnosis not present

## 2016-03-18 MED ORDER — AMLODIPINE BESYLATE 2.5 MG PO TABS: 2.5000 mg | ORAL_TABLET | Freq: Every day | ORAL | Status: DC

## 2016-03-18 NOTE — Assessment & Plan Note (Signed)
I suspect this is multifactorial BP high, stress, etc Addressed these issues

## 2016-03-18 NOTE — Assessment & Plan Note (Signed)
Due for repeat ultrasound She really is afraid she might have ovarian cancer

## 2016-03-18 NOTE — Assessment & Plan Note (Signed)
BP Readings from Last 3 Encounters:  03/18/16 160/80  02/19/16 194/88  01/08/16 168/88   Repeat [id number] systolic (palpable) on right Will add low dose amlodipine

## 2016-03-18 NOTE — Progress Notes (Signed)
Subjective:    Patient ID: Shelly Padilla, female    DOB: 22-Mar-1920, 82 y.o.   MRN: [id number]  HPI Here with friend Benedict Needy Here due to weakness and fall  [address] over curb going to [employer] nose--seen in ER then to respite for 1 day (I saw her there) This has improved  Feels leg weakness and is unsteady Uses walker when doesn't have someone to hold onto (rollator) Slight pain in back of head-- intermittent Knees hurt  Awoke dizzy a few nights ago Had had pretzels with lots of salt No chest pain  No SOB  Current Outpatient Prescriptions on File Prior to Visit  Medication Sig Dispense Refill  . aspirin 81 MG tablet Take 81 mg by mouth daily.    . Multiple Minerals-Vitamins (CALCIUM-MAGNESIUM-ZINC) TABS Take by mouth daily.    . Multiple Vitamins-Minerals (CENTRUM SILVER PO) Take by mouth daily.    . nebivolol (BYSTOLIC) 5 MG tablet Take 1 tablet (5 mg total) by mouth daily. 30 tablet 3  . ramipril (ALTACE) 10 MG capsule Take 1 capsule (10 mg total) by mouth daily. 90 capsule 3  . timolol (TIMOPTIC) 0.5 % ophthalmic solution      No current facility-administered medications on file prior to visit.    Allergies  Allergen Reactions  . Amoxil [Amoxicillin] Anxiety  . Codeine   . Pacerone  [Amiodarone Hcl] Nausea And Vomiting  . Sulfa Drugs Cross Reactors     Past Medical History  Diagnosis Date  . Glaucoma   . HTN (hypertension)   . Atrial fibrillation ([place])   . History of ovarian cyst   . Palpitations   . H/O paroxysmal supraventricular tachycardia     documented by Holter Monitor  . Insomnia   . Hemorrhoids   . Hiatal hernia   . Brain tumor ([place])     meningioma  . Thrombocytosis [facility])     Past Surgical History  Procedure Laterality Date  . Cataract extraction      left eye  . Tonsillectomy  1929  . Leg vein stripping  1970    Family History  Problem Relation Age of Onset  . Heart attack Father   . Heart failure Brother      Social History   Social History  . Marital Status: Married    Spouse Name: N/A  . Number of Children: 1  . Years of Education: N/A   Occupational History  . retired     Network engineer   Social History Main Topics  . Smoking status: Never Smoker   . Smokeless tobacco: Never Used  . Alcohol Use: No     Comment: occasionally wine  . Drug Use: No  . Sexual Activity: Not on file   Other Topics Concern  . 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      Has living will   Son is health care POA   Requests DNR   No tube feeds if cognitively unaware   Review of Systems Vision is off Hearing is not good Has home care for husband 2 days a week--shower and dressing (for [place]). He goes 4 days housecleaner every 2 weeks She still shops and cooks (someone drives her)    Objective:   Physical Exam  Constitutional: She appears well-developed. No distress.  Neck: Normal range of motion. Neck supple. No thyromegaly present.  Cardiovascular: Normal rate, regular rhythm and normal heart sounds.  Exam reveals no gallop.   No murmur heard. Pulmonary/Chest: Effort normal and breath sounds normal. No respiratory distress. She has no wheezes. She has no rales.  Musculoskeletal: She exhibits no edema.  Lymphadenopathy:    She has no cervical adenopathy.  Psychiatric:  Anxious about husband          Assessment & Plan:

## 2016-03-18 NOTE — Assessment & Plan Note (Signed)
Discussed this at length She really needs to consider having him move to [facility]

## 2016-03-18 NOTE — Progress Notes (Signed)
Pre visit review using our clinic review tool, if applicable. No additional management support is needed unless otherwise documented below in the visit note. 

## 2016-03-19 ENCOUNTER — Telehealth: Payer: Self-pay | Admitting: Internal Medicine

## 2016-03-19 ENCOUNTER — Telehealth: Payer: Self-pay | Admitting: Cardiovascular Disease

## 2016-03-19 DIAGNOSIS — N83202 Unspecified ovarian cyst, left side: Principal | ICD-10-CM

## 2016-03-19 DIAGNOSIS — N83201 Unspecified ovarian cyst, right side: Secondary | ICD-10-CM

## 2016-03-19 NOTE — Telephone Encounter (Signed)
Created new order

## 2016-03-19 NOTE — Telephone Encounter (Signed)
What? Please input whatever order they need to do the test. This is part of what is so disturbing with them

## 2016-03-19 NOTE — Telephone Encounter (Signed)
Spoke to [facility] scheduling. Even though the pt is 25, they still the the ultrasound pelvic complete order in Epic. Please advise.

## 2016-03-19 NOTE — Telephone Encounter (Signed)
Patient calling the office for samples of medication:   1.  What medication and dosage are you requesting samples for? Bystolic   2.  Are you currently out of this medication? Almost out

## 2016-03-19 NOTE — Telephone Encounter (Signed)
Placed samples [name] at the front desk for pick up.

## 2016-03-19 NOTE — Telephone Encounter (Signed)
Shelly Padilla- Can you please enter ultrasound pelvic complete and let me know when you have done so. Thank you!

## 2016-03-23 ENCOUNTER — Ambulatory Visit
Admission: RE | Admit: 2016-03-23 | Discharge: 2016-03-23 | Disposition: A | Discharge status: Home / Self Care | Payer: Medicare Other | Source: Ambulatory Visit | Attending: Internal Medicine | Admitting: Internal Medicine

## 2016-03-23 DIAGNOSIS — N83201 Unspecified ovarian cyst, right side: Secondary | ICD-10-CM | POA: Insufficient documentation

## 2016-03-23 DIAGNOSIS — N83202 Unspecified ovarian cyst, left side: Secondary | ICD-10-CM | POA: Diagnosis not present

## 2016-03-23 DIAGNOSIS — D252 Subserosal leiomyoma of uterus: Secondary | ICD-10-CM | POA: Insufficient documentation

## 2016-03-25 ENCOUNTER — Other Ambulatory Visit: Payer: Medicare Other

## 2016-03-25 ENCOUNTER — Other Ambulatory Visit: Payer: Self-pay | Admitting: Geriatric Medicine

## 2016-03-25 DIAGNOSIS — M25562 Pain in left knee: Secondary | ICD-10-CM

## 2016-03-25 DIAGNOSIS — M545 Low back pain: Secondary | ICD-10-CM

## 2016-04-08 ENCOUNTER — Encounter: Payer: Self-pay | Admitting: Cardiovascular Disease

## 2016-04-08 ENCOUNTER — Ambulatory Visit (INDEPENDENT_AMBULATORY_CARE_PROVIDER_SITE_OTHER): Payer: Medicare Other | Admitting: Cardiovascular Disease

## 2016-04-08 VITALS — BP 130/60 | HR 79 | Ht 63.0 in | Wt 130.5 lb

## 2016-04-08 DIAGNOSIS — G4700 Insomnia, unspecified: Secondary | ICD-10-CM | POA: Diagnosis not present

## 2016-04-08 DIAGNOSIS — I6529 Occlusion and stenosis of unspecified carotid artery: Secondary | ICD-10-CM | POA: Diagnosis not present

## 2016-04-08 DIAGNOSIS — I471 Supraventricular tachycardia: Secondary | ICD-10-CM

## 2016-04-08 DIAGNOSIS — I10 Essential (primary) hypertension: Secondary | ICD-10-CM | POA: Diagnosis not present

## 2016-04-08 NOTE — Progress Notes (Signed)
Patient ID: Shelly Padilla, female    DOB: 04/25/1920, 82 y.o.   MRN: [id number]  HPI Comments: 82 year old female with history of hypertension , insomnia , SVT, APCs and PVCs that are symptomatic with previous trial of diltiazem, metoprolol, amiodarone who presents for routine followup of her palpitations and blood pressure  .  In follow-up today, she reports having a fall outside a restaurant as she was stepping up onto the curb [place] onto her face, continues to have mild symptoms, feels woozy at times complains of hearing problems, memory problems. Can't find her hearing aid set home Husband with Alzheimer's, goes to daycare  Reports that she is taking her bystolic one half pill per day. Denies any symptomatic tachycardia or palpitations No regular exercise program  EKG on today's visit shows normal sinus rhythm with rate 79 bpm, right bundle branch block  Other past medical history  She takes care of her husband who has Alzheimer's. He is in  memory daycare several days per week Recent hospitalization August 20 with discharge 08/03/2014 with vision changes, diagnosed with right temporoparietal meningioma 2 x 2 centimeters  She had echocardiogram 08/03/2014 showing normal ejection fraction CT scan of the head showing atrophy and chronic small vessel disease MRI of the brain showing 2 cm right posterior temporal meningioma Carotid ultrasound showing mild bilateral atherosclerosis  Lab work showing total cholesterol 120, LDL 59 Medical management was recommended for her meningioma  Per the patient. She has followup neurosurgery  Holter monitor November 2011 showed paroxysmal SVT.  Allergies  Allergen Reactions  . Amoxil [Amoxicillin] Anxiety  . Codeine   . Pacerone  [Amiodarone Hcl] Nausea And Vomiting  . Sulfa Drugs Cross Reactors     Outpatient Encounter Prescriptions as of 04/08/2016  Medication Sig  . amLODipine (NORVASC) 2.5 MG tablet Take 1 tablet (2.5 mg total) by mouth  daily.  [place] Kitchen aspirin 81 MG tablet Take 81 mg by mouth daily.  . Multiple Minerals-Vitamins (CALCIUM-MAGNESIUM-ZINC) TABS Take by mouth daily.  . Multiple Vitamins-Minerals (CENTRUM SILVER PO) Take by mouth daily.  . nebivolol (BYSTOLIC) 5 MG tablet Take 1 tablet (5 mg total) by mouth daily.  . ramipril (ALTACE) 10 MG capsule Take 1 capsule (10 mg total) by mouth daily.  . timolol (TIMOPTIC) 0.5 % ophthalmic solution    No facility-administered encounter medications on file as of 04/08/2016.    Past Medical History  Diagnosis Date  . Glaucoma   . HTN (hypertension)   . Atrial fibrillation ([place])   . History of ovarian cyst   . Palpitations   . H/O paroxysmal supraventricular tachycardia     documented by Holter Monitor  . Insomnia   . Hemorrhoids   . Hiatal hernia   . Brain tumor ([place])     meningioma  . Thrombocytosis [facility])     Past Surgical History  Procedure Laterality Date  . Cataract extraction      left eye  . Tonsillectomy  1929  . Leg vein stripping  1970    Social History  reports that she has never smoked. She has never used smokeless tobacco. She reports that she does not drink alcohol or use illicit drugs.  Family History family history includes Heart attack in her father; Heart failure in her brother.   Review of Systems  Constitutional: Negative.   Eyes: Negative.   Respiratory: Negative.   Cardiovascular: Negative.   Gastrointestinal: Negative.   Musculoskeletal: Positive for gait problem.  Skin: Negative.   Neurological: Negative.  Hematological: Negative.   Psychiatric/Behavioral: Negative.   All other systems reviewed and are negative.   BP 130/60 mmHg  Pulse 79  Ht 5\' 3"  (1.6 m)  Wt 130 lb 8 oz (59.194 kg)  BMI 23.12 kg/m2  Physical Exam  Constitutional: She is oriented to person, place, and time. She appears well-developed and well-nourished.  HENT:  Head: Normocephalic.  Nose: Nose normal.  Mouth/Throat: Oropharynx is clear and moist.   Eyes: Conjunctivae are normal. Pupils are equal, round, and reactive to light.  Neck: Normal range of motion. Neck supple. No JVD present.  Cardiovascular: Normal rate, regular rhythm, S1 normal, S2 normal, normal heart sounds and intact distal pulses.  Exam reveals no gallop and no friction rub.   No murmur heard. Pulmonary/Chest: Effort normal and breath sounds normal. No respiratory distress. She has no wheezes. She has no rales. She exhibits no tenderness.  Abdominal: Soft. Bowel sounds are normal. She exhibits no distension. There is no tenderness.  Musculoskeletal: Normal range of motion. She exhibits no edema or tenderness.  Lymphadenopathy:    She has no cervical adenopathy.  Neurological: She is alert and oriented to person, place, and time. Coordination normal.  Skin: Skin is warm and dry. No rash noted. No erythema.  Psychiatric: She has a normal mood and affect. Her behavior is normal. Judgment and thought content normal.    Assessment and Plan  Nursing note and vitals reviewed.

## 2016-04-08 NOTE — Patient Instructions (Signed)
You are doing well. No medication changes were made.  Please call us if you have new issues that need to be addressed before your next appt.  Your physician wants you to follow-up in: 6 months.  You will receive a reminder letter in the mail two months in advance. If you don't receive a letter, please call our office to schedule the follow-up appointment.   

## 2016-04-08 NOTE — Assessment & Plan Note (Signed)
Minimal plaque bilaterally on ultrasound in 2015

## 2016-04-08 NOTE — Assessment & Plan Note (Signed)
She denies any tachycardia symptoms, no palpitations Recommended she continue on low-dose beta blocker

## 2016-04-08 NOTE — Assessment & Plan Note (Signed)
Blood pressure is well controlled on today's visit. No changes made to the medications. 

## 2016-04-08 NOTE — Assessment & Plan Note (Signed)
She reports having insomnia, would like Ambien. Recommended she talk with Dr. Silvio Pate.  she does report she was on this in the past and it seemed to work well

## 2016-04-10 ENCOUNTER — Ambulatory Visit (INDEPENDENT_AMBULATORY_CARE_PROVIDER_SITE_OTHER): Payer: Medicare Other | Admitting: Sports Medicine

## 2016-04-10 ENCOUNTER — Encounter: Payer: Self-pay | Admitting: Sports Medicine

## 2016-04-10 DIAGNOSIS — K649 Unspecified hemorrhoids: Secondary | ICD-10-CM | POA: Insufficient documentation

## 2016-04-10 DIAGNOSIS — B351 Tinea unguium: Secondary | ICD-10-CM

## 2016-04-10 DIAGNOSIS — R002 Palpitations: Secondary | ICD-10-CM | POA: Insufficient documentation

## 2016-04-10 DIAGNOSIS — K5790 Diverticulosis of intestine, part unspecified, without perforation or abscess without bleeding: Secondary | ICD-10-CM | POA: Insufficient documentation

## 2016-04-10 DIAGNOSIS — H409 Unspecified glaucoma: Secondary | ICD-10-CM | POA: Insufficient documentation

## 2016-04-10 DIAGNOSIS — M79676 Pain in unspecified toe(s): Secondary | ICD-10-CM

## 2016-04-10 NOTE — Progress Notes (Signed)
Patient ID: Shelly Padilla, female   DOB: 1920/07/30, 82 y.o.   MRN: [id number]   Subjective: Shelly Padilla is a 82 y.o. female patient seen today in office with complaint of thickened and elongated toenails; unable to trim. Patient admits that she had a fall 7 weeks ago and broke her nose; no other issues.   Patient Active Problem List   Diagnosis Date Noted  . DD (diverticular disease) 04/10/2016  . Glaucoma 04/10/2016  . Hemorrhoid 04/10/2016  . BP (high blood pressure) 04/10/2016  . Awareness of heartbeats 04/10/2016  . Dizziness 03/18/2016  . Caregiver stress 03/18/2016  . Bilateral ovarian cysts 03/18/2016  . RLQ abdominal pain 09/06/2015  . Thrombocytosis ([place])   . Preventative health care 06/28/2015  . Varicose veins 01/31/2015  . Advance directive discussed with patient 12/26/2014  . Carotid stenosis 10/01/2014  . Meningioma ([place]) 10/01/2014  . Vision changes 10/01/2014  . Insomnia 05/18/2014  . Episodic mood disorder ([place]) 10/18/2012  . Paroxysmal supraventricular tachycardia ([place]) 03/08/2012  . HTN (hypertension) 03/08/2012  . PVC's (premature ventricular contractions) 03/08/2012   Current Outpatient Prescriptions on File Prior to Visit  Medication Sig Dispense Refill  . amLODipine (NORVASC) 2.5 MG tablet Take 1 tablet (2.5 mg total) by mouth daily. 30 tablet 11  . aspirin 81 MG tablet Take 81 mg by mouth daily.    . Multiple Minerals-Vitamins (CALCIUM-MAGNESIUM-ZINC) TABS Take by mouth daily.    . Multiple Vitamins-Minerals (CENTRUM SILVER PO) Take by mouth daily.    . nebivolol (BYSTOLIC) 5 MG tablet Take 1 tablet (5 mg total) by mouth daily. 30 tablet 3  . ramipril (ALTACE) 10 MG capsule Take 1 capsule (10 mg total) by mouth daily. 90 capsule 3  . timolol (TIMOPTIC) 0.5 % ophthalmic solution      No current facility-administered medications on file prior to visit.   Allergies  Allergen Reactions  . Amoxil [Amoxicillin] Anxiety  . Codeine   . Pacerone   [Amiodarone Hcl] Nausea And Vomiting  . Sulfa Drugs Cross Reactors      Objective: Physical Exam  General: Well developed, nourished, no acute distress, awake, alert and oriented x 3  Vascular: Dorsalis pedis artery 1/4 bilateral, Posterior tibial artery 1/4 bilateral, skin temperature warm to warm proximal to distal bilateral lower extremities, + varicosities, no edema, pedal hair present bilateral.  Neurological: Gross sensation present via light touch bilateral.   Dermatological: Skin is warm, dry, and supple bilateral, Nails 1-10 are tender, long, thick, and discolored with mild subungal debris, no webspace macerations present bilateral, no open lesions present bilateral, no callus/corns/hyperkeratotic tissue present bilateral. No signs of infection bilateral.  Musculoskeletal: Mild asymptomatic hammertoes and distal fat pad migration noted bilateral. Muscular strength within normal limits without pain or limitation on range of motion. No pain with calf compression bilateral.  Assessment and Plan:  Problem List Items Addressed This Visit    None    Visit Diagnoses    Pain of toe, unspecified laterality    -  Primary    Dermatophytosis of nail          -Examined patient.  -Discussed treatment options for painful mycotic nails. -Mechanically debrided and reduced mycotic nails with sterile nail nipper and dremel nail file without incident. - Recommend good supportive shoes for foot type daily -Patient to return in 3 months for follow up evaluation or sooner if symptoms worsen.  Shelly Padilla, DPM

## 2016-04-22 ENCOUNTER — Encounter: Payer: Self-pay | Admitting: Internal Medicine

## 2016-04-22 ENCOUNTER — Ambulatory Visit (INDEPENDENT_AMBULATORY_CARE_PROVIDER_SITE_OTHER): Payer: Medicare Other | Admitting: Internal Medicine

## 2016-04-22 VITALS — BP 142/80 | HR 67 | Temp 97.4°F | Wt 130.5 lb

## 2016-04-22 DIAGNOSIS — R42 Dizziness and giddiness: Secondary | ICD-10-CM

## 2016-04-22 DIAGNOSIS — F39 Unspecified mood [affective] disorder: Secondary | ICD-10-CM

## 2016-04-22 DIAGNOSIS — I6529 Occlusion and stenosis of unspecified carotid artery: Secondary | ICD-10-CM

## 2016-04-22 DIAGNOSIS — D329 Benign neoplasm of meninges, unspecified: Secondary | ICD-10-CM | POA: Diagnosis not present

## 2016-04-22 DIAGNOSIS — G4700 Insomnia, unspecified: Secondary | ICD-10-CM

## 2016-04-22 DIAGNOSIS — D473 Essential (hemorrhagic) thrombocythemia: Secondary | ICD-10-CM

## 2016-04-22 DIAGNOSIS — D75839 Thrombocytosis, unspecified: Secondary | ICD-10-CM

## 2016-04-22 MED ORDER — ZOLPIDEM TARTRATE 5 MG PO TABS: 2.5000 mg | ORAL_TABLET | Freq: Every evening | ORAL | Status: DC | PRN

## 2016-04-22 NOTE — Progress Notes (Signed)
   Subjective:    Patient ID: Shelly Padilla, female    DOB: 10-30-1920, 82 y.o.   MRN: [id number]  HPI Here for follow up on BP and stress Reviewed cardiology note Has checked BP with [facility]--- under 140/90 No problems with the amlodipine No edema Some leg weakness though--still relates to the fall  Feels she is forgetting things since the fall ?concussion  Still has stress with husband No rest when he was in rehab--she was there all the time  Review of Systems  Not sleeping very well--wants to have zolpidem for occasional use (discussed 1-2/ week) Appetite is not good Weight going down---doesn't even eat lunch when he is at the [facility]     Objective:   Physical Exam  Psychiatric:  Mildly anxious but appropriate interaction          Assessment & Plan:

## 2016-04-22 NOTE — Assessment & Plan Note (Signed)
Will give zolpidem rx for rare use

## 2016-04-22 NOTE — Assessment & Plan Note (Signed)
Some better Rx for rollator

## 2016-04-22 NOTE — Assessment & Plan Note (Signed)
Recent memory changes likely from mild concussion  No neuro features to suggest sig change in this

## 2016-04-22 NOTE — Assessment & Plan Note (Signed)
No evidence of thrombosis No Rx

## 2016-04-22 NOTE — Progress Notes (Signed)
Pre visit review using our clinic review tool, if applicable. No additional management support is needed unless otherwise documented below in the visit note. 

## 2016-04-22 NOTE — Assessment & Plan Note (Signed)
Still with some stress--- may be some better Is ready to move husband to Memory Care when bed available

## 2016-04-23 ENCOUNTER — Telehealth: Payer: Self-pay

## 2016-04-23 NOTE — Telephone Encounter (Signed)
[facility] called to verify zolpidem rx. Given instructions and quantity per med list. Pt had cut the prescription off the full sheet. [facility] voiced undersstanding

## 2016-06-09 ENCOUNTER — Ambulatory Visit (INDEPENDENT_AMBULATORY_CARE_PROVIDER_SITE_OTHER): Payer: Medicare Other | Admitting: Internal Medicine

## 2016-06-09 ENCOUNTER — Encounter: Payer: Self-pay | Admitting: Internal Medicine

## 2016-06-09 VITALS — BP 132/78 | HR 74 | Temp 97.9°F | Ht 63.0 in | Wt 133.5 lb

## 2016-06-09 DIAGNOSIS — K4090 Unilateral inguinal hernia, without obstruction or gangrene, not specified as recurrent: Secondary | ICD-10-CM

## 2016-06-09 DIAGNOSIS — I6529 Occlusion and stenosis of unspecified carotid artery: Secondary | ICD-10-CM

## 2016-06-09 DIAGNOSIS — I872 Venous insufficiency (chronic) (peripheral): Secondary | ICD-10-CM

## 2016-06-09 MED ORDER — FUROSEMIDE 20 MG PO TABS: 20.0000 mg | ORAL_TABLET | Freq: Every day | ORAL | Status: DC | PRN

## 2016-06-09 NOTE — Assessment & Plan Note (Signed)
Small reducible hernia No action for now

## 2016-06-09 NOTE — Progress Notes (Signed)
Pre visit review using our clinic review tool, if applicable. No additional management support is needed unless otherwise documented below in the visit note. 

## 2016-06-09 NOTE — Progress Notes (Signed)
Subjective:    Patient ID: Shelly Padilla, female    DOB: [birth year], 82 y.o.   MRN: [id number]  HPI Here due to leg swelling  Husband just transferred to [place] to have mad the transition okay She still emotional and upset about this Determined to adjust and care for herself  Noticed vein swelling and pain in right leg about 10 days Left leg starting hurting last night Feels she has swelling in both groins--right more than left (this is not new)  Eating okay Weight is stable  Current Outpatient Prescriptions on File Prior to Visit  Medication Sig Dispense Refill  . amLODipine (NORVASC) 2.5 MG tablet Take 1 tablet (2.5 mg total) by mouth daily. 30 tablet 11  . aspirin 81 MG tablet Take 81 mg by mouth daily.    . Multiple Minerals-Vitamins (CALCIUM-MAGNESIUM-ZINC) TABS Take by mouth daily.    . Multiple Vitamins-Minerals (CENTRUM SILVER PO) Take by mouth daily.    . nebivolol (BYSTOLIC) 5 MG tablet Take 1 tablet (5 mg total) by mouth daily. 30 tablet 3  . ramipril (ALTACE) 10 MG capsule Take 1 capsule (10 mg total) by mouth daily. 90 capsule 3  . timolol (TIMOPTIC) 0.5 % ophthalmic solution     . zolpidem (AMBIEN) 5 MG tablet Take 0.5-1 tablets (2.5-5 mg total) by mouth at bedtime as needed for sleep. Limit to 1 or 2 per week 30 tablet 0   No current facility-administered medications on file prior to visit.    Allergies  Allergen Reactions  . Amoxil [Amoxicillin] Anxiety  . Codeine   . Pacerone  [Amiodarone Hcl] Nausea And Vomiting  . Sulfa Drugs Cross Reactors     Past Medical History  Diagnosis Date  . Glaucoma   . HTN (hypertension)   . Atrial fibrillation ([place])   . History of ovarian cyst   . Palpitations   . H/O paroxysmal supraventricular tachycardia     documented by Holter Monitor  . Insomnia   . Hemorrhoids   . Hiatal hernia   . Brain tumor ([place])     meningioma  . Thrombocytosis [facility])     Past Surgical History  Procedure Laterality Date    . Cataract extraction      left eye  . Tonsillectomy  1929  . Leg vein stripping  1970    Family History  Problem Relation Age of Onset  . Heart attack Father   . Heart failure Brother     Social History   Social History  . Marital Status: Married    Spouse Name: N/A  . Number of Children: 1  . Years of Education: N/A   Occupational History  . retired     Network engineer   Social History Main Topics  . Smoking status: Never Smoker   . Smokeless tobacco: Never Used  . Alcohol Use: 0.0 oz/week    0 Standard drinks or equivalent per week     Comment: occasionally wine  . Drug Use: No  . Sexual Activity: Not on file   Other Topics Concern  . 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      Has living will   Son is health care POA   Requests DNR   No tube feeds if cognitively unaware   Review of Systems  No fever Had chest pain 1 night ---3 nights ago. Sat for a while and it went away Breathing has been  okay Okay feels her heart skip  Feels her memory has been bad     Objective:   Physical Exam  Constitutional: She appears well-developed and well-nourished. No distress.  Cardiovascular: Normal rate, regular rhythm and normal heart sounds.  Exam reveals no gallop.   No murmur heard. Pulmonary/Chest: Effort normal and breath sounds normal. No respiratory distress. She has no wheezes. She has no rales.  Musculoskeletal:  2+ pitting edema in right ankle--less so on left Venous dilation on right calf No calf tenderness though          Assessment & Plan:

## 2016-06-09 NOTE — Patient Instructions (Signed)
Please try support hose (20-58mm) Use the furosemide (fluid pill) in the morning if your legs are especially swollen.   Low-Sodium Eating Plan Sodium raises blood pressure and causes water to be held in the body. Getting less sodium from food will help lower your blood pressure, reduce any swelling, and protect your heart, liver, and kidneys. We get sodium by adding salt (sodium chloride) to food. Most of our sodium comes from canned, boxed, and frozen foods. Restaurant foods, fast foods, and pizza are also very high in sodium. Even if you take medicine to lower your blood pressure or to reduce fluid in your body, getting less sodium from your food is important. WHAT IS MY PLAN? Most people should limit their sodium intake to 2,300 mg a day. Your health care provider recommends that you limit your sodium intake to __________ a day.  WHAT DO I NEED TO KNOW ABOUT THIS EATING PLAN? For the low-sodium eating plan, you will follow these general guidelines:  Choose foods with a % Daily Value for sodium of less than 5% (as listed on the food label).   Use salt-free seasonings or herbs instead of table salt or sea salt.   Check with your health care provider or pharmacist before using salt substitutes.   Eat fresh foods.  Eat more vegetables and fruits.  Limit canned vegetables. If you do use them, rinse them well to decrease the sodium.   Limit cheese to 1 oz (28 g) per day.   Eat lower-sodium products, often labeled as "lower sodium" or "no salt added."  Avoid foods that contain monosodium glutamate (MSG). MSG is sometimes added to [place] food and some canned foods.  Check food labels (Nutrition Facts labels) on foods to learn how much sodium is in one serving.  Eat more home-cooked food and less restaurant, buffet, and fast food.  When eating at a restaurant, ask that your food be prepared with less salt, or no salt if possible.  HOW DO I READ FOOD LABELS FOR SODIUM  INFORMATION? The Nutrition Facts label lists the amount of sodium in one serving of the food. If you eat more than one serving, you must multiply the listed amount of sodium by the number of servings. Food labels may also identify foods as:  Sodium free--Less than 5 mg in a serving.  Very low sodium--35 mg or less in a serving.  Low sodium--140 mg or less in a serving.  Light in sodium--50% less sodium in a serving. For example, if a food that usually has 300 mg of sodium is changed to become light in sodium, it will have 150 mg of sodium.  Reduced sodium--25% less sodium in a serving. For example, if a food that usually has 400 mg of sodium is changed to reduced sodium, it will have 300 mg of sodium. WHAT FOODS CAN I EAT? Grains Low-sodium cereals, including oats, puffed wheat and rice, and shredded wheat cereals. Low-sodium crackers. Unsalted rice and pasta. Lower-sodium bread.  Vegetables Frozen or fresh vegetables. Low-sodium or reduced-sodium canned vegetables. Low-sodium or reduced-sodium tomato sauce and paste. Low-sodium or reduced-sodium tomato and vegetable juices.  Fruits Fresh, frozen, and canned fruit. Fruit juice.  Meat and Other Protein Products Low-sodium canned tuna and salmon. Fresh or frozen meat, poultry, seafood, and fish. Lamb. Unsalted nuts. Dried beans, peas, and lentils without added salt. Unsalted canned beans. Homemade soups without salt. Eggs.  Dairy Milk. Soy milk. Ricotta cheese. Low-sodium or reduced-sodium cheeses. Yogurt.  Condiments Fresh and  dried herbs and spices. Salt-free seasonings. Onion and garlic powders. Low-sodium varieties of mustard and ketchup. Fresh or refrigerated horseradish. Lemon juice.  Fats and Oils Reduced-sodium salad dressings. Unsalted butter.  Other Unsalted popcorn and pretzels.  The items listed above may not be a complete list of recommended foods or beverages. Contact your dietitian for more options. WHAT FOODS  ARE NOT RECOMMENDED? Grains Instant hot cereals. Bread stuffing, pancake, and biscuit mixes. Croutons. Seasoned rice or pasta mixes. Noodle soup cups. Boxed or frozen macaroni and cheese. Self-rising flour. Regular salted crackers. Vegetables Regular canned vegetables. Regular canned tomato sauce and paste. Regular tomato and vegetable juices. Frozen vegetables in sauces. Salted [place] fries. Olives. Angie Fava. Relishes. Sauerkraut. Salsa. Meat and Other Protein Products Salted, canned, smoked, spiced, or pickled meats, seafood, or fish. Bacon, ham, sausage, hot dogs, corned beef, chipped beef, and packaged luncheon meats. Salt pork. Jerky. Pickled herring. Anchovies, regular canned tuna, and sardines. Salted nuts. Dairy Processed cheese and cheese spreads. Cheese curds. Blue cheese and cottage cheese. Buttermilk.  Condiments Onion and garlic salt, seasoned salt, table salt, and sea salt. Canned and packaged gravies. Worcestershire sauce. Tartar sauce. Barbecue sauce. Teriyaki sauce. Soy sauce, including reduced sodium. Steak sauce. Fish sauce. Oyster sauce. Cocktail sauce. Horseradish that you find on the shelf. Regular ketchup and mustard. Meat flavorings and tenderizers. Bouillon cubes. Hot sauce. Tabasco sauce. Marinades. Taco seasonings. Relishes. Fats and Oils Regular salad dressings. Salted butter. Margarine. Ghee. Bacon fat.  Other Potato and tortilla chips. Corn chips and puffs. Salted popcorn and pretzels. Canned or dried soups. Pizza. Frozen entrees and pot pies.  The items listed above may not be a complete list of foods and beverages to avoid. Contact your dietitian for more information.   This information is not intended to replace advice given to you by your health care provider. Make sure you discuss any questions you have with your health care provider.   Document Released: 05/22/2002 Document Revised: 12/21/2014 Document Reviewed: 10/04/2013 Elsevier Interactive Patient  Education [employer].

## 2016-06-09 NOTE — Assessment & Plan Note (Signed)
More symptomatic over the past 10 days Discussed avoiding salt Compression hose Furosemide for prn use

## 2016-07-01 ENCOUNTER — Encounter: Payer: Self-pay | Admitting: Internal Medicine

## 2016-07-01 ENCOUNTER — Ambulatory Visit (INDEPENDENT_AMBULATORY_CARE_PROVIDER_SITE_OTHER): Payer: Medicare Other | Admitting: Internal Medicine

## 2016-07-01 VITALS — BP 128/70 | HR 77 | Temp 97.4°F | Ht 62.5 in | Wt 132.0 lb

## 2016-07-01 DIAGNOSIS — Z0000 Encounter for general adult medical examination without abnormal findings: Secondary | ICD-10-CM | POA: Diagnosis not present

## 2016-07-01 DIAGNOSIS — F39 Unspecified mood [affective] disorder: Secondary | ICD-10-CM | POA: Diagnosis not present

## 2016-07-01 DIAGNOSIS — D329 Benign neoplasm of meninges, unspecified: Secondary | ICD-10-CM

## 2016-07-01 DIAGNOSIS — I6529 Occlusion and stenosis of unspecified carotid artery: Secondary | ICD-10-CM

## 2016-07-01 DIAGNOSIS — I471 Supraventricular tachycardia: Secondary | ICD-10-CM

## 2016-07-01 DIAGNOSIS — Z7189 Other specified counseling: Secondary | ICD-10-CM

## 2016-07-01 DIAGNOSIS — I10 Essential (primary) hypertension: Secondary | ICD-10-CM

## 2016-07-01 DIAGNOSIS — D473 Essential (hemorrhagic) thrombocythemia: Secondary | ICD-10-CM

## 2016-07-01 DIAGNOSIS — Z23 Encounter for immunization: Secondary | ICD-10-CM | POA: Diagnosis not present

## 2016-07-01 DIAGNOSIS — D75839 Thrombocytosis, unspecified: Secondary | ICD-10-CM

## 2016-07-01 LAB — CBC WITH DIFFERENTIAL/PLATELET
BASOS PCT: 0.5 % (ref 0.0–3.0)
Basophils Absolute: 0 10*3/uL (ref 0.0–0.1)
EOS PCT: 1.1 % (ref 0.0–5.0)
Eosinophils Absolute: 0.1 10*3/uL (ref 0.0–0.7)
HEMATOCRIT: 42.2 % (ref 36.0–46.0)
HEMOGLOBIN: 14.1 g/dL (ref 12.0–15.0)
LYMPHS PCT: 11.4 % — AB (ref 12.0–46.0)
Lymphs Abs: 1.1 10*3/uL (ref 0.7–4.0)
MCHC: 33.3 g/dL (ref 30.0–36.0)
MCV: 89.1 fl (ref 78.0–100.0)
MONOS PCT: 10.1 % (ref 3.0–12.0)
Monocytes Absolute: 1 10*3/uL (ref 0.1–1.0)
NEUTROS ABS: 7.4 10*3/uL (ref 1.4–7.7)
Neutrophils Relative %: 76.9 % (ref 43.0–77.0)
Platelets: 696 10*3/uL — ABNORMAL HIGH (ref 150.0–400.0)
RBC: 4.74 Mil/uL (ref 3.87–5.11)
RDW: 13.6 % (ref 11.5–15.5)
WBC: 9.7 10*3/uL (ref 4.0–10.5)

## 2016-07-01 LAB — COMPREHENSIVE METABOLIC PANEL
ALBUMIN: 4.3 g/dL (ref 3.5–5.2)
ALT: 17 U/L (ref 0–35)
AST: 21 U/L (ref 0–37)
Alkaline Phosphatase: 56 U/L (ref 39–117)
BUN: 19 mg/dL (ref 6–23)
CALCIUM: 9.5 mg/dL (ref 8.4–10.5)
CHLORIDE: 98 meq/L (ref 96–112)
CO2: 32 meq/L (ref 19–32)
Creatinine, Ser: 0.55 mg/dL (ref 0.40–1.20)
GFR: 109.4 mL/min (ref >60.00)
Glucose, Bld: 91 mg/dL (ref 70–99)
POTASSIUM: 4.4 meq/L (ref 3.5–5.1)
Sodium: 136 mEq/L (ref 135–145)
Total Bilirubin: 0.5 mg/dL (ref 0.2–1.2)
Total Protein: 7.1 g/dL (ref 6.0–8.3)

## 2016-07-01 LAB — T4, FREE: Free T4: 0.88 ng/dL (ref 0.60–1.60)

## 2016-07-01 NOTE — Assessment & Plan Note (Signed)
No neuro symptoms so no testing for now

## 2016-07-01 NOTE — Progress Notes (Signed)
Subjective:    Patient ID: Shelly Padilla, female    DOB: 1920-01-02, 82 y.o.   MRN: [id number]  HPI Here for Medicare wellness and follow up of chronic health conditions Reviewed advanced directives and form. Reviewed other doctors-- Dr Gollan--cardiology, podiatrist also Jacqualyn Posey) No tobacco or alcohol 1 fall with nasal fracture--no other falls No regular exercise--discussed meeting with the fitness director  Vision is not great Hearing is not good--needs hearing aide Doesn't drive other than golf cart--- independent with instrumental ADLS. Cleaner every 2 weeks Still notes some memory issues---at times very good though  Stress is down Husband now in memory care Still not sleeping well--will rarely take the zolpidem Not really depressed---big family get together soon for her birthday Not anhedonic  Has had rare skips in heart--or seems slow No rapid palpitations No chest pain No SOB No dizziness or syncope Has had some edema---support socks seem to help. Better overall with less salt in diet  No focal weakness No facial droop, aphasia, etc No other evidence of stroke  Current Outpatient Prescriptions on File Prior to Visit  Medication Sig Dispense Refill  . amLODipine (NORVASC) 2.5 MG tablet Take 1 tablet (2.5 mg total) by mouth daily. 30 tablet 11  . aspirin 81 MG tablet Take 81 mg by mouth daily.    . furosemide (LASIX) 20 MG tablet Take 1 tablet (20 mg total) by mouth daily as needed. 30 tablet 0  . Multiple Minerals-Vitamins (CALCIUM-MAGNESIUM-ZINC) TABS Take by mouth daily.    . Multiple Vitamins-Minerals (CENTRUM SILVER PO) Take by mouth daily.    . nebivolol (BYSTOLIC) 5 MG tablet Take 1 tablet (5 mg total) by mouth daily. 30 tablet 3  . ramipril (ALTACE) 10 MG capsule Take 1 capsule (10 mg total) by mouth daily. 90 capsule 3  . timolol (TIMOPTIC) 0.5 % ophthalmic solution     . zolpidem (AMBIEN) 5 MG tablet Take 0.5-1 tablets (2.5-5 mg total) by mouth at bedtime  as needed for sleep. Limit to 1 or 2 per week 30 tablet 0   No current facility-administered medications on file prior to visit.    Allergies  Allergen Reactions  . Amoxil [Amoxicillin] Anxiety  . Codeine   . Pacerone  [Amiodarone Hcl] Nausea And Vomiting  . Sulfa Drugs Cross Reactors     Past Medical History  Diagnosis Date  . Glaucoma   . HTN (hypertension)   . Atrial fibrillation ([place])   . History of ovarian cyst   . Palpitations   . H/O paroxysmal supraventricular tachycardia     documented by Holter Monitor  . Insomnia   . Hemorrhoids   . Hiatal hernia   . Brain tumor ([place])     meningioma  . Thrombocytosis [facility])     Past Surgical History  Procedure Laterality Date  . Cataract extraction      left eye  . Tonsillectomy  1929  . Leg vein stripping  1970    Family History  Problem Relation Age of Onset  . Heart attack Father   . Heart failure Brother     Social History   Social History  . Marital Status: Married    Spouse Name: N/A  . Number of Children: 1  . Years of Education: N/A   Occupational History  . retired     Network engineer   Social History Main Topics  . Smoking status: Never Smoker   . Smokeless tobacco: Never Used  . Alcohol Use: 0.0 oz/week  0 Standard drinks or equivalent per week     Comment: occasionally wine  . Drug Use: No  . Sexual Activity: Not on file   Other Topics Concern  . Not on file   Social History Narrative   Primary care giver of husband with Alzheimer's--now in memory care      Has living will   Son is health care POA   Has DNR   No tube feeds if cognitively unaware   Review of Systems Sleep is not great still Awakens with dry mouth at times Appetite is okay Weight is stable Due for dental visit--teeth okay Has low back pain--especially on right. Doesn't use meds (recommended tylenol/heat) No rash or suspicious skin lesions Bowels are "big"--no blood or pain though Voids frequently--now wears pad for  occasional urge incontinence    Objective:   Physical Exam  Constitutional: She is oriented to person, place, and time. She appears well-developed and well-nourished. No distress.  HENT:  Mouth/Throat: Oropharynx is clear and moist. No oropharyngeal exudate.  Neck: Normal range of motion. Neck supple. No thyromegaly present.  Cardiovascular: Normal rate, regular rhythm and normal heart sounds.  Exam reveals no gallop.   No murmur heard. Feet warm but no palpable pulses  Abdominal: Soft. There is no tenderness.  Musculoskeletal: She exhibits no tenderness.  1+ edema  Lymphadenopathy:    She has no cervical adenopathy.  Neurological: She is alert and oriented to person, place, and time.  President-- "Dwaine Deter, Bush" [phone] [id number] Recall 3/3  Skin: No rash noted. No erythema.  Psychiatric: She has a normal mood and affect.          Assessment & Plan:

## 2016-07-01 NOTE — Assessment & Plan Note (Signed)
Mood better with husband in memory care---stress decreased (though his decline is upsetting) No meds indicated

## 2016-07-01 NOTE — Progress Notes (Signed)
Pre visit review using our clinic review tool, if applicable. No additional management support is needed unless otherwise documented below in the visit note. 

## 2016-07-01 NOTE — Assessment & Plan Note (Signed)
May just be reactive process Will recheck labs today

## 2016-07-01 NOTE — Assessment & Plan Note (Signed)
I have personally reviewed the Medicare Annual Wellness questionnaire and have noted 1. The patient's medical and social history 2. Their use of alcohol, tobacco or illicit drugs 3. Their current medications and supplements 4. The patient's functional ability including ADL's, fall risks, home safety risks and hearing or visual             impairment. 5. Diet and physical activities 6. Evidence for depression or mood disorders  The patients weight, height, BMI and visual acuity have been recorded in the chart I have made referrals, counseling and provided education to the patient based review of the above and I have provided the pt with a written personalized care plan for preventive services.  I have provided you with a copy of your personalized plan for preventive services. Please take the time to review along with your updated medication list.  No cancer screening due to age Pneumovax today Prefers no flu vaccine

## 2016-07-01 NOTE — Assessment & Plan Note (Signed)
Has DNR 

## 2016-07-01 NOTE — Assessment & Plan Note (Signed)
No apparent paroxysms on bystolic

## 2016-07-01 NOTE — Addendum Note (Signed)
Addended by: Pilar Grammes on: 07/01/2016 11:41 AM   Modules accepted: Orders

## 2016-07-01 NOTE — Assessment & Plan Note (Signed)
BP Readings from Last 3 Encounters:  07/01/16 128/70  06/09/16 132/78  04/22/16 142/80   Good control No changes

## 2016-07-03 ENCOUNTER — Encounter: Payer: Self-pay | Admitting: Sports Medicine

## 2016-07-03 ENCOUNTER — Ambulatory Visit (INDEPENDENT_AMBULATORY_CARE_PROVIDER_SITE_OTHER): Payer: Medicare Other | Admitting: Sports Medicine

## 2016-07-03 DIAGNOSIS — B351 Tinea unguium: Secondary | ICD-10-CM

## 2016-07-03 DIAGNOSIS — M79676 Pain in unspecified toe(s): Secondary | ICD-10-CM

## 2016-07-03 NOTE — Progress Notes (Signed)
Patient ID: Shelly Padilla, female   DOB: [birth year], 82 y.o.   MRN: [id number]  Subjective: Shelly Padilla is a 82 y.o. female patient seen today in office with complaint of thickened and elongated toenails; unable to trim. No other issues.   Patient Active Problem List   Diagnosis Date Noted  . Chronic venous insufficiency 06/09/2016  . Right inguinal hernia 06/09/2016  . DD (diverticular disease) 04/10/2016  . Glaucoma 04/10/2016  . Hemorrhoid 04/10/2016  . BP (high blood pressure) 04/10/2016  . Bilateral ovarian cysts 03/18/2016  . Thrombocytosis ([place])   . Preventative health care 06/28/2015  . Varicose veins 01/31/2015  . Advance directive discussed with patient 12/26/2014  . Carotid stenosis 10/01/2014  . Meningioma ([place]) 10/01/2014  . Insomnia 05/18/2014  . Episodic mood disorder ([place]) 10/18/2012  . Paroxysmal supraventricular tachycardia ([place]) 03/08/2012  . HTN (hypertension) 03/08/2012  . PVC's (premature ventricular contractions) 03/08/2012   Current Outpatient Prescriptions on File Prior to Visit  Medication Sig Dispense Refill  . amLODipine (NORVASC) 2.5 MG tablet Take 1 tablet (2.5 mg total) by mouth daily. 30 tablet 11  . aspirin 81 MG tablet Take 81 mg by mouth daily.    . furosemide (LASIX) 20 MG tablet Take 1 tablet (20 mg total) by mouth daily as needed. 30 tablet 0  . Multiple Minerals-Vitamins (CALCIUM-MAGNESIUM-ZINC) TABS Take by mouth daily.    . Multiple Vitamins-Minerals (CENTRUM SILVER PO) Take by mouth daily.    . nebivolol (BYSTOLIC) 5 MG tablet Take 1 tablet (5 mg total) by mouth daily. 30 tablet 3  . ramipril (ALTACE) 10 MG capsule Take 1 capsule (10 mg total) by mouth daily. 90 capsule 3  . timolol (TIMOPTIC) 0.5 % ophthalmic solution     . zolpidem (AMBIEN) 5 MG tablet Take 0.5-1 tablets (2.5-5 mg total) by mouth at bedtime as needed for sleep. Limit to 1 or 2 per week 30 tablet 0   No current facility-administered medications on file prior to visit.    Allergies  Allergen Reactions  . Amoxil [Amoxicillin] Anxiety  . Codeine   . Pacerone  [Amiodarone Hcl] Nausea And Vomiting  . Sulfa Drugs Cross Reactors      Objective: Physical Exam  General: Well developed, nourished, no acute distress, awake, alert and oriented x 3  Vascular: Dorsalis pedis artery 1/4 bilateral, Posterior tibial artery 1/4 bilateral, skin temperature warm to warm proximal to distal bilateral lower extremities, + varicosities, no edema, pedal hair present bilateral.  Neurological: Gross sensation present via light touch bilateral.   Dermatological: Skin is warm, dry, and supple bilateral, Nails 1-10 are tender, long, thick, and discolored with mild subungal debris, no webspace macerations present bilateral, no open lesions present bilateral, no callus/corns/hyperkeratotic tissue present bilateral. No signs of infection bilateral.  Musculoskeletal: Mild asymptomatic hammertoes and distal fat pad migration noted bilateral. Muscular strength within normal limits without pain or limitation on range of motion. No pain with calf compression bilateral.  Assessment and Plan:  Problem List Items Addressed This Visit    None    Visit Diagnoses    Pain of toe, unspecified laterality    -  Primary    Dermatophytosis of nail          -Examined patient.  -Discussed treatment options for painful mycotic nails. -Mechanically debrided and reduced mycotic nails with sterile nail nipper and dremel nail file without incident. - Recommend good supportive shoes for foot type daily -Patient to return in 3 months for follow up  evaluation or sooner if symptoms worsen.  Landis Martins, DPM

## 2016-07-10 ENCOUNTER — Ambulatory Visit: Payer: Medicare Other | Admitting: Sports Medicine

## 2016-07-22 ENCOUNTER — Emergency Department: Payer: Medicare Other

## 2016-07-22 ENCOUNTER — Telehealth: Payer: Self-pay | Admitting: Internal Medicine

## 2016-07-22 ENCOUNTER — Ambulatory Visit: Payer: Medicare Other | Admitting: Internal Medicine

## 2016-07-22 ENCOUNTER — Encounter: Payer: Self-pay | Admitting: *Deleted

## 2016-07-22 ENCOUNTER — Emergency Department
Admission: EM | Admit: 2016-07-22 | Discharge: 2016-07-22 | Disposition: A | Discharge status: Home / Self Care | Payer: Medicare Other | Attending: Emergency Medicine | Admitting: Emergency Medicine

## 2016-07-22 DIAGNOSIS — I4891 Unspecified atrial fibrillation: Secondary | ICD-10-CM | POA: Insufficient documentation

## 2016-07-22 DIAGNOSIS — Y929 Unspecified place or not applicable: Secondary | ICD-10-CM | POA: Insufficient documentation

## 2016-07-22 DIAGNOSIS — Y939 Activity, unspecified: Secondary | ICD-10-CM | POA: Insufficient documentation

## 2016-07-22 DIAGNOSIS — X58XXXA Exposure to other specified factors, initial encounter: Secondary | ICD-10-CM | POA: Insufficient documentation

## 2016-07-22 DIAGNOSIS — R350 Frequency of micturition: Secondary | ICD-10-CM | POA: Insufficient documentation

## 2016-07-22 DIAGNOSIS — R2243 Localized swelling, mass and lump, lower limb, bilateral: Secondary | ICD-10-CM | POA: Diagnosis not present

## 2016-07-22 DIAGNOSIS — R600 Localized edema: Secondary | ICD-10-CM

## 2016-07-22 DIAGNOSIS — S9032XA Contusion of left foot, initial encounter: Secondary | ICD-10-CM | POA: Diagnosis not present

## 2016-07-22 DIAGNOSIS — Y999 Unspecified external cause status: Secondary | ICD-10-CM | POA: Diagnosis not present

## 2016-07-22 DIAGNOSIS — Z7982 Long term (current) use of aspirin: Secondary | ICD-10-CM | POA: Diagnosis not present

## 2016-07-22 DIAGNOSIS — R2242 Localized swelling, mass and lump, left lower limb: Secondary | ICD-10-CM | POA: Diagnosis present

## 2016-07-22 DIAGNOSIS — I10 Essential (primary) hypertension: Secondary | ICD-10-CM | POA: Diagnosis not present

## 2016-07-22 DIAGNOSIS — Z79899 Other long term (current) drug therapy: Secondary | ICD-10-CM | POA: Insufficient documentation

## 2016-07-22 DIAGNOSIS — R079 Chest pain, unspecified: Secondary | ICD-10-CM | POA: Diagnosis not present

## 2016-07-22 DIAGNOSIS — M7989 Other specified soft tissue disorders: Secondary | ICD-10-CM | POA: Diagnosis not present

## 2016-07-22 LAB — URINALYSIS COMPLETE WITH MICROSCOPIC (ARMC ONLY)
Bilirubin Urine: NEGATIVE
GLUCOSE, UA: NEGATIVE mg/dL
HGB URINE DIPSTICK: NEGATIVE
Ketones, ur: NEGATIVE mg/dL
NITRITE: NEGATIVE
PH: 6 (ref 5.0–8.0)
Protein, ur: NEGATIVE mg/dL
SPECIFIC GRAVITY, URINE: 1.013 (ref 1.005–1.030)
Squamous Epithelial / LPF: NONE SEEN

## 2016-07-22 LAB — COMPREHENSIVE METABOLIC PANEL
ALBUMIN: 4 g/dL (ref 3.5–5.0)
ALT: 15 U/L (ref 14–54)
ANION GAP: 7 (ref 5–15)
AST: 20 U/L (ref 15–41)
Alkaline Phosphatase: 58 U/L (ref 38–126)
BUN: 19 mg/dL (ref 6–20)
CALCIUM: 8.9 mg/dL (ref 8.9–10.3)
CHLORIDE: 102 mmol/L (ref 101–111)
CO2: 28 mmol/L (ref 22–32)
Creatinine, Ser: 0.47 mg/dL (ref 0.44–1.00)
GFR calc non Af Amer: >60 mL/min (ref >60)
GLUCOSE: 150 mg/dL — AB (ref 65–99)
POTASSIUM: 4.1 mmol/L (ref 3.5–5.1)
SODIUM: 137 mmol/L (ref 135–145)
Total Bilirubin: 0.3 mg/dL (ref 0.3–1.2)
Total Protein: 6.5 g/dL (ref 6.5–8.1)

## 2016-07-22 LAB — CBC
HCT: 38.4 % (ref 35.0–47.0)
HEMOGLOBIN: 13.6 g/dL (ref 12.0–16.0)
MCH: 31.1 pg (ref 26.0–34.0)
MCHC: 35.3 g/dL (ref 32.0–36.0)
MCV: 88 fL (ref 80.0–100.0)
PLATELETS: 640 10*3/uL — AB (ref 150–440)
RBC: 4.36 MIL/uL (ref 3.80–5.20)
RDW: 13.2 % (ref 11.5–14.5)
WBC: 10.6 10*3/uL (ref 3.6–11.0)

## 2016-07-22 LAB — TROPONIN I: Troponin I: <0.03 ng/mL (ref <0.03)

## 2016-07-22 NOTE — Discharge Instructions (Signed)
Keep taking your Lasix every day as prescribed.  Your blood tests, urine test, chest xray, foot xray, and leg ultrasound were all unremarkable.

## 2016-07-22 NOTE — ED Provider Notes (Signed)
[facility] Emergency Department Provider Note  ____________________________________________  Time seen: Approximately 5:39 PM  I have reviewed the triage vital signs and the nursing notes.   HISTORY  Chief Complaint Foot Pain and Leg Swelling    HPI Shelly Padilla is a 82 y.o. female who complains of swelling in bilateral lower extremities, left slightly greater than right, as well as bruising discoloration of her left second and third toes since yesterday. She denies any known trauma to the leg. No coldness or loss of severe pain in the foot. She's been compliant with her medications. She had been prescribed Lasix to take as needed for leg swelling by her primary care doctor which she just started taking yesterday so she's taken a total of 2 doses of the Lasix for this leg swelling. Denies chest pain but has had occasional shortness of breath worse when lying flat in bed. No significant change in exercise tolerance, no exertional symptoms.     Past Medical History:  Diagnosis Date  . Atrial fibrillation ([place])   . Brain tumor ([place])    meningioma  . Glaucoma   . H/O paroxysmal supraventricular tachycardia    documented by Holter Monitor  . Hemorrhoids   . Hiatal hernia   . History of ovarian cyst   . HTN (hypertension)   . Insomnia   . Palpitations   . Thrombocytosis [facility])      Patient Active Problem List   Diagnosis Date Noted  . Chronic venous insufficiency 06/09/2016  . Right inguinal hernia 06/09/2016  . DD (diverticular disease) 04/10/2016  . Glaucoma 04/10/2016  . Hemorrhoid 04/10/2016  . BP (high blood pressure) 04/10/2016  . Bilateral ovarian cysts 03/18/2016  . Thrombocytosis ([place])   . Preventative health care 06/28/2015  . Varicose veins 01/31/2015  . Advance directive discussed with patient 12/26/2014  . Carotid stenosis 10/01/2014  . Meningioma ([place]) 10/01/2014  . Insomnia 05/18/2014  . Episodic mood disorder ([place]) 10/18/2012   . Paroxysmal supraventricular tachycardia ([place]) 03/08/2012  . HTN (hypertension) 03/08/2012  . PVC's (premature ventricular contractions) 03/08/2012     Past Surgical History:  Procedure Laterality Date  . CATARACT EXTRACTION     left eye  . Leg vein stripping  1970  . TONSILLECTOMY  1929     Prior to Admission medications   Medication Sig Start Date End Date Taking? Authorizing Provider  amLODipine (NORVASC) 2.5 MG tablet Take 1 tablet (2.5 mg total) by mouth daily. 03/18/16  Yes Venia Carbon, MD  aspirin 81 MG tablet Take 81 mg by mouth daily.   Yes Historical Provider, MD  furosemide (LASIX) 20 MG tablet Take 1 tablet (20 mg total) by mouth daily as needed. 06/09/16  Yes Venia Carbon, MD  Multiple Vitamins-Minerals (CENTRUM SILVER PO) Take 1 tablet by mouth daily.    Yes Historical Provider, MD  nebivolol (BYSTOLIC) 5 MG tablet Take 1 tablet (5 mg total) by mouth daily. 01/08/16  Yes Ryan M Dunn, PA-C  ramipril (ALTACE) 10 MG capsule Take 1 capsule (10 mg total) by mouth daily. 10/08/15  Yes Minna Merritts, MD  timolol (TIMOPTIC) 0.5 % ophthalmic solution Place 1 drop into both eyes daily.  04/29/15  Yes Historical Provider, MD  zolpidem (AMBIEN) 5 MG tablet Take 0.5-1 tablets (2.5-5 mg total) by mouth at bedtime as needed for sleep. Limit to 1 or 2 per week 04/22/16  Yes Venia Carbon, MD     Allergies Amoxil [amoxicillin]; Codeine; Pacerone  [  amiodarone hcl]; and Sulfa drugs cross reactors   Family History  Problem Relation Age of Onset  . Heart attack Father   . Heart failure Brother     Social History Social History  Substance Use Topics  . Smoking status: Never Smoker  . Smokeless tobacco: Never Used  . Alcohol use 0.0 oz/week     Comment: occasionally wine    Review of Systems  Constitutional:   No fever or chills.  Cardiovascular:   No chest pain. Respiratory:   Occasional shortness of breath, most recently this morning on  awakening.. Gastrointestinal:   Negative for abdominal pain, vomiting and diarrhea.  Genitourinary:   Positive urinary frequency. Musculoskeletal:   Negative for focal pain positive bilateral lower extremity edema Neurological:   Negative for headaches 10-point ROS otherwise negative.  ____________________________________________   PHYSICAL EXAM:  VITAL SIGNS: ED Triage Vitals  Enc Vitals Group     BP 07/22/16 1609 136/69     Pulse Rate 07/22/16 1609 65     Resp 07/22/16 1609 18     Temp 07/22/16 1609 98.3 F (36.8 C)     Temp Source 07/22/16 1609 Oral     SpO2 07/22/16 1609 96 %     Weight 07/22/16 1609 132 lb (59.9 kg)     Height 07/22/16 1609 5\' 3"  (1.6 m)     Head Circumference --      Peak Flow --      Pain Score 07/22/16 1610 8     Pain Loc --      Pain Edu? --      Excl. in [place]? --     Vital signs reviewed, nursing assessments reviewed.   Constitutional:   Alert and oriented. Well appearing and in no distress. Eyes:   No scleral icterus. No conjunctival pallor. PERRL. EOMI.  No nystagmus. ENT   Head:   Normocephalic and atraumatic.   Nose:   No congestion/rhinnorhea. No septal hematoma   Mouth/Throat:   MMM, no pharyngeal erythema. No peritonsillar mass.    Neck:   No stridor. No SubQ emphysema. No meningismus. Hematological/Lymphatic/Immunilogical:   No cervical lymphadenopathy. Cardiovascular:   Irregularly irregular rhythm. Symmetric bilateral radial and DP pulses.  No murmurs. Normal capillary refill in all extremities including the distal toes past the discoloration area. Respiratory:   Normal respiratory effort without tachypnea nor retractions. Breath sounds are clear and equal bilaterally. No wheezes/rales/rhonchi. Gastrointestinal:   Soft and nontender. Non distended. There is no CVA tenderness.  No rebound, rigidity, or guarding. Genitourinary:   deferred Musculoskeletal:   Nontender with normal range of motion in all extremities. No joint  effusions.  No lower extremity tenderness.  2+ pitting edema bilateral lower extremities, symmetric, mainly at the feet and ankles. Circumference is symmetric. Negative Homans sign, no calf tenderness, no palpable cord.. Neurologic:   Normal speech and language.  CN 2-10 normal. Motor grossly intact. No gross focal neurologic deficits are appreciated.  Skin:    Skin is warm, dry and intact. There is purplish discoloration over the dorsal aspect of the left second and third toes which is irregular. It is consistent with bruising. Nontender, not warm, no inflammatory changes. It is not consistent with cellulitis. Not consistent with embolic phenomena  ____________________________________________    LABS (pertinent positives/negatives) (all labs ordered are listed, but only abnormal results are displayed) Labs Reviewed  CBC - Abnormal; Notable for the following:       Result Value   Platelets 640 (*)  All other components within normal limits  COMPREHENSIVE METABOLIC PANEL - Abnormal; Notable for the following:    Glucose, Bld 150 (*)    All other components within normal limits  URINALYSIS COMPLETEWITH MICROSCOPIC ([facility] ONLY) - Abnormal; Notable for the following:    Color, Urine YELLOW (*)    APPearance CLEAR (*)    Leukocytes, UA 1+ (*)    Bacteria, UA RARE (*)    All other components within normal limits  URINE CULTURE  TROPONIN I   ____________________________________________   EKG  Interpreted by me Sinus rhythm rate of 84, left axis, normal intervals. I bundle-branch block. No acute ischemic changes.  ____________________________________________    RADIOLOGY  Chest x-ray unremarkable X-ray left foot unremarkable Ultrasound left lower extremity unremarkable.  ____________________________________________   PROCEDURES Procedures  ____________________________________________   INITIAL IMPRESSION / ASSESSMENT AND PLAN / ED COURSE  Pertinent labs & imaging  results that were available during my care of the patient were reviewed by me and considered in my medical decision making (see chart for details).  Patient presents with peripheral edema bilaterally which is symmetric. Also has some bruising discoloration of her left toes without any known trauma. By appearance he does appear to that is likely related to some contusion and minor trauma. No fracture or dislocation evident on exam. Imaging labs urinalysis all unremarkable. Low suspicion for ACS or cardiomyopathy.Patient for soft tissue infection and osteomyelitis fracture dislocation. Low suspicion for vascular occlusion. Continue home meds. For the peripheral edema she should continue her Lasix and follow up with primary care.     Clinical Course   ____________________________________________   FINAL CLINICAL IMPRESSION(S) / ED DIAGNOSES  Final diagnoses:  Bilateral lower extremity edema  Foot contusion, left, initial encounter       Portions of this note were generated with dragon dictation software. Dictation errors may occur despite best attempts at proofreading.    Carrie Mew, MD 07/22/16 2018

## 2016-07-22 NOTE — ED Notes (Signed)
Upon assessment both ankles and feet swollen. Both pedal pulses present. Per pt's report both ankles and feet tender to touch and upon ambulation.

## 2016-07-22 NOTE — ED Triage Notes (Addendum)
Pt arrives with complaints of left leg pain and swelling, states yesterday 2 toes turned black, pt ambulatory to triage, states she saw her PCP recently and was started on a fluid pill

## 2016-07-22 NOTE — Telephone Encounter (Signed)
Patient Name: Shelly Padilla  DOB: [birth year]    Initial Comment Caller states her feet are swollen, itching, red on bottom. 2 toes are blackish gray.   Nurse Assessment  Nurse: Raphael Gibney, RN, [place] Date/Time (Eastern Time): 07/22/2016 1:50:05 PM  Confirm and document reason for call. If symptomatic, describe symptoms. You must click the next button to save text entered. ---Caller states her feet are swollen and the bottom of her feet are red. 2nd and 3 rd toe are blackish down to her foot and about 1/3 inch into her left foot. toes are tender to touch.  Has the patient traveled out of the country within the last 30 days? ---Not Applicable  Does the patient have any new or worsening symptoms? ---Yes  Will a triage be completed? ---Yes  Related visit to physician within the last 2 weeks? ---No  Does the PT have any chronic conditions? (i.e. diabetes, asthma, etc.) ---No  Is this a behavioral health or substance abuse call? ---No     Guidelines    Guideline Title Affirmed Question Affirmed Notes  Foot Pain Purple or black skin on foot or toe    Final Disposition User   Go to ED Now Raphael Gibney, RN, [facility] - ED   Disagree/Comply: Comply

## 2016-07-23 NOTE — Telephone Encounter (Signed)
Please call her I think she needs Rx for her furosemide----seen in ER yesterday for worse swelling Set up follow up with me next week

## 2016-07-23 NOTE — Telephone Encounter (Signed)
Pt returned your call, requesting call back thanks

## 2016-07-23 NOTE — Telephone Encounter (Signed)
Left message to call office

## 2016-07-24 LAB — URINE CULTURE

## 2016-07-24 NOTE — Telephone Encounter (Signed)
Pt said she had not been taking the furosemide. She will go back on it. Made her an appt Aug 15.

## 2016-07-27 ENCOUNTER — Telehealth: Payer: Self-pay | Admitting: Cardiovascular Disease

## 2016-07-27 NOTE — Telephone Encounter (Signed)
Patient calling the office for samples of medication:   1.  What medication and dosage are you requesting samples for? Bystolic 5 mg  2.  Are you currently out of this medication?   Has a few days left.

## 2016-07-27 NOTE — Telephone Encounter (Signed)
Notified patient samples available for Bystolic 5 mg.

## 2016-07-28 ENCOUNTER — Encounter: Payer: Self-pay | Admitting: Internal Medicine

## 2016-07-28 ENCOUNTER — Ambulatory Visit (INDEPENDENT_AMBULATORY_CARE_PROVIDER_SITE_OTHER): Payer: Medicare Other | Admitting: Internal Medicine

## 2016-07-28 DIAGNOSIS — I6529 Occlusion and stenosis of unspecified carotid artery: Secondary | ICD-10-CM | POA: Diagnosis not present

## 2016-07-28 DIAGNOSIS — S90129A Contusion of unspecified lesser toe(s) without damage to nail, initial encounter: Secondary | ICD-10-CM | POA: Insufficient documentation

## 2016-07-28 DIAGNOSIS — S90122A Contusion of left lesser toe(s) without damage to nail, initial encounter: Secondary | ICD-10-CM | POA: Diagnosis not present

## 2016-07-28 MED ORDER — KETOCONAZOLE 2 % EX CREA: 1.0000 "application " | TOPICAL_CREAM | Freq: Two times a day (BID) | CUTANEOUS | 2 refills | Status: DC | PRN

## 2016-07-28 NOTE — Assessment & Plan Note (Addendum)
Clearly had bad contusion causing the blackness (prompting the ER evaluation). No vascular insufficiency Has chronic venous stasis changes but not painful (and she doesn't tolerate hose)

## 2016-07-28 NOTE — Progress Notes (Signed)
Pre visit review using our clinic review tool, if applicable. No additional management support is needed unless otherwise documented below in the visit note. 

## 2016-07-28 NOTE — Progress Notes (Signed)
Subjective:    Patient ID: Shelly Padilla, female    DOB: 1920-06-03, 82 y.o.   MRN: [id number]  HPI Here with friend Beverlee Nims for ER follow up  They had been blackish grey at the time of ER visit She didn't have any trauma Wondered if her shoes could be a problem  Tried the furosemide--gave her palpitations She hasn't been taking regularly--once twice since ER visit  Has stretchy sensation in feet Not really painful Redness and cracking on plantar foot--podiatrist said "athlete's foot" (told to use OTC spray which didn't help)  Current Outpatient Prescriptions on File Prior to Visit  Medication Sig Dispense Refill  . amLODipine (NORVASC) 2.5 MG tablet Take 1 tablet (2.5 mg total) by mouth daily. 30 tablet 11  . aspirin 81 MG tablet Take 81 mg by mouth daily.    . furosemide (LASIX) 20 MG tablet Take 1 tablet (20 mg total) by mouth daily as needed. 30 tablet 0  . Multiple Vitamins-Minerals (CENTRUM SILVER PO) Take 1 tablet by mouth daily.     . nebivolol (BYSTOLIC) 5 MG tablet Take 1 tablet (5 mg total) by mouth daily. 30 tablet 3  . ramipril (ALTACE) 10 MG capsule Take 1 capsule (10 mg total) by mouth daily. 90 capsule 3  . timolol (TIMOPTIC) 0.5 % ophthalmic solution Place 1 drop into both eyes daily.     Marland Kitchen zolpidem (AMBIEN) 5 MG tablet Take 0.5-1 tablets (2.5-5 mg total) by mouth at bedtime as needed for sleep. Limit to 1 or 2 per week 30 tablet 0   No current facility-administered medications on file prior to visit.     Allergies  Allergen Reactions  . Amoxil [Amoxicillin] Anxiety  . Codeine   . Pacerone  [Amiodarone Hcl] Nausea And Vomiting  . Sulfa Drugs Cross Reactors     Past Medical History:  Diagnosis Date  . Atrial fibrillation ([place])   . Brain tumor ([place])    meningioma  . Glaucoma   . H/O paroxysmal supraventricular tachycardia    documented by Holter Monitor  . Hemorrhoids   . Hiatal hernia   . History of ovarian cyst   . HTN (hypertension)   . Insomnia    . Palpitations   . Thrombocytosis ([place])     Past Surgical History:  Procedure Laterality Date  . CATARACT EXTRACTION     left eye  . Leg vein stripping  1970  . TONSILLECTOMY  1929    Family History  Problem Relation Age of Onset  . Heart attack Father   . Heart failure Brother     Social History   Social History  . Marital status: Married    Spouse name: N/A  . Number of children: 1  . Years of education: N/A   Occupational History  . retired     Network engineer   Social History Main Topics  . Smoking status: Never Smoker  . Smokeless tobacco: Never Used  . Alcohol use 0.0 oz/week     Comment: occasionally wine  . Drug use: No  . Sexual activity: Not on file   Other Topics Concern  . Not on file   Social History Narrative   Primary care giver of husband with Alzheimer's--now in memory care      Has living will   Son is health care POA   Has DNR   No tube feeds if cognitively unaware   Review of Systems  No SOB Appetite is okay Weight up slightly Has urinary  urgency--but then often doesn't have to go Having some memory problems--discussed getting extra help at home for support     Objective:   Physical Exam  Cardiovascular: Intact distal pulses.   Musculoskeletal:  1+ pitting edema in feet Chronic venous stasis changes in both feet--right actually worse then left  Skin:  Resolving contusions sig to left 2nd and 3rd toes and slightly in great toe          Assessment & Plan:

## 2016-08-10 DIAGNOSIS — I10 Essential (primary) hypertension: Secondary | ICD-10-CM | POA: Diagnosis not present

## 2016-08-10 DIAGNOSIS — H6123 Impacted cerumen, bilateral: Secondary | ICD-10-CM | POA: Diagnosis not present

## 2016-08-26 ENCOUNTER — Telehealth: Payer: Self-pay | Admitting: Cardiovascular Disease

## 2016-08-26 NOTE — Telephone Encounter (Signed)
Pt would like Bystolic samples. 

## 2016-08-26 NOTE — Telephone Encounter (Signed)
Samples of Bystolic 5 mg 0000000 Exp. 4/19 gave 4 bottles.

## 2016-09-09 DIAGNOSIS — H40051 Ocular hypertension, right eye: Secondary | ICD-10-CM | POA: Diagnosis not present

## 2016-09-17 DIAGNOSIS — H903 Sensorineural hearing loss, bilateral: Secondary | ICD-10-CM | POA: Diagnosis not present

## 2016-09-29 ENCOUNTER — Ambulatory Visit: Payer: Medicare Other | Admitting: Podiatry

## 2016-10-06 ENCOUNTER — Ambulatory Visit (INDEPENDENT_AMBULATORY_CARE_PROVIDER_SITE_OTHER): Payer: Medicare Other | Admitting: Podiatry

## 2016-10-06 ENCOUNTER — Encounter: Payer: Self-pay | Admitting: Podiatry

## 2016-10-06 VITALS — BP 143/70 | HR 74 | Resp 16

## 2016-10-06 DIAGNOSIS — B351 Tinea unguium: Secondary | ICD-10-CM | POA: Diagnosis not present

## 2016-10-06 DIAGNOSIS — M79609 Pain in unspecified limb: Principal | ICD-10-CM

## 2016-10-06 DIAGNOSIS — M79676 Pain in unspecified toe(s): Secondary | ICD-10-CM | POA: Diagnosis not present

## 2016-10-06 DIAGNOSIS — L603 Nail dystrophy: Secondary | ICD-10-CM

## 2016-10-06 DIAGNOSIS — L608 Other nail disorders: Secondary | ICD-10-CM

## 2016-10-06 NOTE — Progress Notes (Signed)
SUBJECTIVE Patient  presents to office today complaining of elongated, thickened nails. Pain while ambulating in shoes. Patient is unable to trim their own nails.   OBJECTIVE General Patient is awake, alert, and oriented x 3 and in no acute distress. Derm Skin is dry and supple bilateral. Negative open lesions or macerations. Remaining integument unremarkable. Nails are tender, long, thickened and dystrophic with subungual debris, consistent with onychomycosis, 1-5 bilateral. No signs of infection noted. Vasc  DP and PT pedal pulses palpable bilaterally. Temperature gradient within normal limits.  Neuro Epicritic and protective threshold sensation diminished bilaterally.  Musculoskeletal Exam No symptomatic pedal deformities noted bilateral. Muscular strength within normal limits.  ASSESSMENT 1. Onychodystrophic nails 1-5 bilateral with hyperkeratosis of nails.  2. Onychomycosis of nail due to dermatophyte bilateral 3. Pain in foot bilateral  PLAN OF CARE 1. Patient evaluated today.  2. Instructed to maintain good pedal hygiene and foot care.  3. Mechanical debridement of nails 1-5 bilaterally performed using a nail nipper. Filed with dremel without incident.  4. Return to clinic in 3 mos.   Patient goes by "addy"   Edrick Kins, DPM

## 2016-10-07 ENCOUNTER — Other Ambulatory Visit: Payer: Self-pay | Admitting: Internal Medicine

## 2016-10-07 ENCOUNTER — Other Ambulatory Visit: Payer: Self-pay | Admitting: Cardiovascular Disease

## 2016-10-07 NOTE — Telephone Encounter (Signed)
Last filled 04-23-16 #30 Last OV 07-28-16 Next OV 07-05-17

## 2016-10-07 NOTE — Telephone Encounter (Signed)
Approved: 30 x 0 

## 2016-10-07 NOTE — Telephone Encounter (Signed)
Left refill on voice mail at pharmacy  

## 2016-10-16 ENCOUNTER — Encounter: Payer: Self-pay | Admitting: Cardiovascular Disease

## 2016-10-16 ENCOUNTER — Ambulatory Visit (INDEPENDENT_AMBULATORY_CARE_PROVIDER_SITE_OTHER): Payer: Medicare Other | Admitting: Cardiovascular Disease

## 2016-10-16 VITALS — BP 138/68 | HR 75 | Ht 63.5 in | Wt 139.2 lb

## 2016-10-16 DIAGNOSIS — I493 Ventricular premature depolarization: Secondary | ICD-10-CM

## 2016-10-16 DIAGNOSIS — R2681 Unsteadiness on feet: Secondary | ICD-10-CM

## 2016-10-16 DIAGNOSIS — I471 Supraventricular tachycardia: Secondary | ICD-10-CM

## 2016-10-16 DIAGNOSIS — W19XXXD Unspecified fall, subsequent encounter: Secondary | ICD-10-CM

## 2016-10-16 DIAGNOSIS — I6529 Occlusion and stenosis of unspecified carotid artery: Secondary | ICD-10-CM | POA: Diagnosis not present

## 2016-10-16 DIAGNOSIS — I159 Secondary hypertension, unspecified: Secondary | ICD-10-CM | POA: Diagnosis not present

## 2016-10-16 NOTE — Patient Instructions (Signed)

## 2016-10-16 NOTE — Progress Notes (Signed)
Cardiology Office Note  Date:  10/16/2016   ID:  Shelly Padilla, DOB February 10, 1920, MRN [id number]  PCP:  Viviana Simpler, MD   Chief Complaint  Patient presents with  . other    Pt. c/o rapid heart beats. Meds reviewed by the pt. verbally.     HPI:  82 year old female with history of hypertension , insomnia , SVT, APCs and PVCs that are symptomatic with previous trial of diltiazem, metoprolol, amiodarone who presents for routine followup of her palpitations and blood pressure  .  In follow-up today she reports that she has had more falls since her last clinic visit Uses a walker in her house, does not like to take a walker when she does her travel [place] on her face Was evaluated, told she may have had a concussion  Denies any lightheadedness or dizziness Hard of hearing, ordered new hearing aids Husband with Alzheimer's, goes to daycare  Reports that she is taking her bystolic one half pill per day.  Last night could not sleep, anxious, had palpitations. Scared her so she sat in a chair No regular exercise program  EKG on today's visit shows normal sinus rhythm with rate 76 bpm, right bundle branch block  Other past medical history  She takes care of her husband who has Alzheimer's. He is in  memory daycare several days per week Recent hospitalization August 20 with discharge 08/03/2014 with vision changes, diagnosed with right temporoparietal meningioma 2 x 2 centimeters  She had echocardiogram 08/03/2014 showing normal ejection fraction CT scan of the head showing atrophy and chronic small vessel disease MRI of the brain showing 2 cm right posterior temporal meningioma Carotid ultrasound showing mild bilateral atherosclerosis  Lab work showing total cholesterol 120, LDL 59 Medical management was recommended for her meningioma  Per the patient. She has followup neurosurgery  Holter monitor November 2011 showed paroxysmal SVT.   PMH:   has a past medical history of  Atrial fibrillation ([place]); Brain tumor ([place]); Glaucoma; H/O paroxysmal supraventricular tachycardia; Hemorrhoids; Hiatal hernia; History of ovarian cyst; HTN (hypertension); Insomnia; Palpitations; and Thrombocytosis ([place]).  PSH:    Past Surgical History:  Procedure Laterality Date  . CATARACT EXTRACTION     left eye  . Leg vein stripping  1970  . TONSILLECTOMY  1929    Current Outpatient Prescriptions  Medication Sig Dispense Refill  . amLODipine (NORVASC) 2.5 MG tablet Take 1 tablet (2.5 mg total) by mouth daily. 30 tablet 11  . aspirin 81 MG tablet Take 81 mg by mouth daily.    . furosemide (LASIX) 20 MG tablet Take 1 tablet (20 mg total) by mouth daily as needed. 30 tablet 0  . ketoconazole (NIZORAL) 2 % cream Apply 1 application topically 2 (two) times daily as needed for irritation. 60 g 2  . Multiple Vitamin (MULTIVITAMIN) tablet Take 1 tablet by mouth daily.    . Multiple Vitamins-Minerals (CENTRUM SILVER PO) Take 1 tablet by mouth daily.     . nebivolol (BYSTOLIC) 5 MG tablet Take 1 tablet (5 mg total) by mouth daily. 30 tablet 3  . ramipril (ALTACE) 10 MG capsule TAKE ONE CAPSULE BY MOUTH ONCE DAILY 90 capsule 3  . timolol (TIMOPTIC) 0.5 % ophthalmic solution Place 1 drop into both eyes daily.     Marland Kitchen zolpidem (AMBIEN) 5 MG tablet TAKE ONE-HALF TO ONE TABLET BY MOUTH AT BEDTIME AS NEEDED FOR SLEEP *LIMIT  TO  ONE  TO  TWO  PER  WEEK* 30 tablet 0  No current facility-administered medications for this visit.      Allergies:   Amoxil [amoxicillin]; Codeine; Pacerone  [amiodarone hcl]; and Sulfa drugs cross reactors   Social History:  The patient  reports that she has never smoked. She has never used smokeless tobacco. She reports that she drinks alcohol. She reports that she does not use drugs.   Family History:   family history includes Heart attack in her father; Heart failure in her brother.    Review of Systems: Review of Systems  Constitutional: Negative.    Respiratory: Negative.   Cardiovascular: Positive for palpitations.  Gastrointestinal: Negative.   Musculoskeletal: Negative.   Neurological: Negative.   Psychiatric/Behavioral: The patient is nervous/anxious and has insomnia.   All other systems reviewed and are negative.    PHYSICAL EXAM: VS:  BP 138/68 (BP Location: Left Arm, Patient Position: Sitting, Cuff Size: Normal)   Pulse 75   Ht 5' 3.5" (1.613 m)   Wt 139 lb 4 oz (63.2 kg)   BMI 24.28 kg/m  , BMI Body mass index is 24.28 kg/m. GEN: Well nourished, well developed, in no acute distress  HEENT: normal  Neck: no JVD, carotid bruits, or masses Cardiac: RRR; no murmurs, rubs, or gallops,no edema  Respiratory:  clear to auscultation bilaterally, normal work of breathing GI: soft, nontender, nondistended, + BS MS: no deformity or atrophy  Skin: warm and dry, no rash Neuro:  Strength and sensation are intact Psych: euthymic mood, full affect    Recent Labs: 07/22/2016: ALT 15; BUN 19; Creatinine, Ser 0.47; Hemoglobin 13.6; Platelets 640; Potassium 4.1; Sodium 137    Lipid Panel Lab Results  Component Value Date   CHOL 153 06/28/2015   HDL 60.20 06/28/2015   LDLCALC 75 06/28/2015   TRIG 90.0 06/28/2015      Wt Readings from Last 3 Encounters:  10/16/16 139 lb 4 oz (63.2 kg)  07/28/16 135 lb 4 oz (61.3 kg)  07/22/16 132 lb (59.9 kg)       ASSESSMENT AND PLAN:  Secondary hypertension - Plan: EKG 12-Lead Blood pressure is well controlled on today's visit. No changes made to the medications. On ramipril, amlodipine beta blocker,   PVC's (premature ventricular contractions) - Plan: EKG 12-Lead Periodically has palpitations consistent with APCs and PVCs She is taking low-dose bytsolic 2.5 mg daily. Samples provided. Recommended she take extra beta blocker as needed for days when she has symptomatic palpitations  Paroxysmal supraventricular tachycardia ([place]) - Plan: EKG 12-Lead Denies any long runs of  tachycardia concerning for SVT  Unsteady gait Recommended she use her walker  Fall, subsequent encounter Several falls over the past year, concussion, no broken bones   Total encounter time more than 15 minutes  Greater than 50% was spent in counseling and coordination of care with the patient   Disposition:   F/U  6 months   Orders Placed This Encounter  Procedures  . EKG 12-Lead     Signed, Esmond Plants, M.D., Ph.D. 10/16/2016  [place], [place]

## 2016-11-23 ENCOUNTER — Telehealth: Payer: Self-pay | Admitting: Cardiovascular Disease

## 2016-11-23 NOTE — Telephone Encounter (Signed)
Patient notified samples available of Bystolic 5 mg Lot # [id number] Exp. 4/19, gave 4 boxes.

## 2016-11-23 NOTE — Telephone Encounter (Signed)
Patient called and needs samples of nebivolol (BYSTOLIC) 5 MG tablet. Please call when ready for pick up. Thanks

## 2016-12-24 ENCOUNTER — Ambulatory Visit (INDEPENDENT_AMBULATORY_CARE_PROVIDER_SITE_OTHER): Payer: Medicare Other | Admitting: Cardiovascular Disease

## 2016-12-24 ENCOUNTER — Encounter: Payer: Self-pay | Admitting: Cardiovascular Disease

## 2016-12-24 ENCOUNTER — Telehealth: Payer: Self-pay | Admitting: Cardiovascular Disease

## 2016-12-24 VITALS — BP 158/82 | HR 69 | Ht 63.5 in | Wt 141.5 lb

## 2016-12-24 DIAGNOSIS — I471 Supraventricular tachycardia: Secondary | ICD-10-CM

## 2016-12-24 DIAGNOSIS — I493 Ventricular premature depolarization: Secondary | ICD-10-CM

## 2016-12-24 DIAGNOSIS — F39 Unspecified mood [affective] disorder: Secondary | ICD-10-CM

## 2016-12-24 DIAGNOSIS — I10 Essential (primary) hypertension: Secondary | ICD-10-CM

## 2016-12-24 DIAGNOSIS — I872 Venous insufficiency (chronic) (peripheral): Secondary | ICD-10-CM | POA: Diagnosis not present

## 2016-12-24 DIAGNOSIS — M7989 Other specified soft tissue disorders: Secondary | ICD-10-CM

## 2016-12-24 MED ORDER — FUROSEMIDE 20 MG PO TABS: 20.0000 mg | ORAL_TABLET | Freq: Every day | ORAL | 6 refills | Status: DC | PRN

## 2016-12-24 NOTE — Telephone Encounter (Signed)
Dr. Rockey Situ has an opening this afternoon at 2:40. Will you see if she would like to come in?

## 2016-12-24 NOTE — Telephone Encounter (Signed)
Pt states she has been waking up during the night with a regular heartbeat. States when she gets up and walk from room to room,  After about 1/2 it goes back to regular States her legs, ankles and feet have been swelling.  Pt c/o swelling: STAT is pt has developed SOB within 24 hours  1. How long have you been experiencing swelling? 6 months  2. Where is the swelling located? Mostly in right leg, ankle and foot, both feet are swollen  3.  Are you currently taking a "fluid pill"? Not any more  4.  Are you currently SOB? no  5.  Have you traveled recently?no

## 2016-12-24 NOTE — Telephone Encounter (Signed)
Lmov for patient

## 2016-12-24 NOTE — Progress Notes (Signed)
Cardiology Office Note  Date:  12/24/2016   ID:  Jamyriah Wambach, DOB [birth year], MRN [id number]  PCP:  Viviana Simpler, MD   Chief Complaint  Patient presents with  . other    early f/u due pt call c/o swelling in ankles, legs, feet. Reviewed meds with pt verbally.    HPI:  83 year old female with history of hypertension , insomnia , SVT, APCs and PVCs that are symptomatic with previous trial of diltiazem, metoprolol, amiodarone who presents for routine followup of her palpitations and blood pressure .  In follow-up today she reports having some stressors Husband has been moved to memory care She is sitting long periods of time particularly many afternoons per week Reports having worsening lower extremity swelling Neighbors are cooking food for her, possibly salty She increased her beta blocker dose for palpitations, concerned the higher dose bystolic 5 mg is giving her leg swelling Does not wear compression hose are not reactive Denies any recent falls Uses a walker in her house No near syncope symptoms Hard of hearing  She does have Lasix but has not been taking this, feels it makes her palpitations worse Continued problems with insomnia Sits up in a chair at night  EKG on today's visit shows normal sinus rhythm with rate 69 bpm, right bundle branch block  Other past medical history She takes care of her husband who has Alzheimer's. He is in memory daycare several days per week Recent hospitalization August 20 with discharge 08/03/2014 with vision changes, diagnosed with right temporoparietal meningioma 2 x 2 centimeters  She had echocardiogram 08/03/2014 showing normal ejection fraction CT scan of the head showing atrophy and chronic small vessel disease MRI of the brain showing 2 cm right posterior temporal meningioma Carotid ultrasound showing mild bilateral atherosclerosis  Lab work showing total cholesterol 120, LDL 59 Medical management was recommended for  her meningioma Per the patient. She has followup neurosurgery  Holter monitor November 2011 showed paroxysmal SVT.   PMH:   has a past medical history of Atrial fibrillation ([place]); Brain tumor ([place]); Glaucoma; H/O paroxysmal supraventricular tachycardia; Hemorrhoids; Hiatal hernia; History of ovarian cyst; HTN (hypertension); Insomnia; Palpitations; and Thrombocytosis ([place]).  PSH:    Past Surgical History:  Procedure Laterality Date  . CATARACT EXTRACTION     left eye  . Leg vein stripping  1970  . TONSILLECTOMY  1929    Current Outpatient Prescriptions  Medication Sig Dispense Refill  . amLODipine (NORVASC) 2.5 MG tablet Take 1 tablet (2.5 mg total) by mouth daily. 30 tablet 11  . aspirin 81 MG tablet Take 81 mg by mouth daily.    Marland Kitchen ketoconazole (NIZORAL) 2 % cream Apply 1 application topically 2 (two) times daily as needed for irritation. 60 g 2  . Multiple Vitamin (MULTIVITAMIN) tablet Take 1 tablet by mouth daily.    . Multiple Vitamins-Minerals (CENTRUM SILVER PO) Take 1 tablet by mouth daily.     . nebivolol (BYSTOLIC) 5 MG tablet Take 1 tablet (5 mg total) by mouth daily. 30 tablet 3  . ramipril (ALTACE) 10 MG capsule TAKE ONE CAPSULE BY MOUTH ONCE DAILY 90 capsule 3  . timolol (TIMOPTIC) 0.5 % ophthalmic solution Place 1 drop into both eyes daily.     Marland Kitchen zolpidem (AMBIEN) 5 MG tablet TAKE ONE-HALF TO ONE TABLET BY MOUTH AT BEDTIME AS NEEDED FOR SLEEP *LIMIT  TO  ONE  TO  TWO  PER  WEEK* 30 tablet 0  . furosemide (LASIX) 20 MG tablet  Take 1 tablet (20 mg total) by mouth daily as needed. 30 tablet 6   No current facility-administered medications for this visit.      Allergies:   Amoxil [amoxicillin]; Codeine; Pacerone  [amiodarone hcl]; and Sulfa drugs cross reactors   Social History:  The patient  reports that she has never smoked. She has never used smokeless tobacco. She reports that she drinks alcohol. She reports that she does not use drugs.   Family History:    family history includes Heart attack in her father; Heart failure in her brother.    Review of Systems: Review of Systems  Constitutional: Negative.   Respiratory: Negative.   Cardiovascular: Positive for palpitations and leg swelling.  Gastrointestinal: Negative.   Musculoskeletal: Negative.        Gait instability  Neurological: Negative.   Psychiatric/Behavioral: Negative.   All other systems reviewed and are negative.    PHYSICAL EXAM: VS:  BP (!) 158/82 (BP Location: Left Arm, Patient Position: Sitting, Cuff Size: Normal)   Pulse 69   Ht 5' 3.5" (1.613 m)   Wt 141 lb 8 oz (64.2 kg)   BMI 24.67 kg/m  , BMI Body mass index is 24.67 kg/m. GEN: Well nourished, well developed, in no acute distress  HEENT: normal  Neck: no JVD, carotid bruits, or masses Cardiac: RRR; no murmurs, rubs, or gallops, trace pitting edema lower extremities bilaterally Respiratory:  clear to auscultation bilaterally, normal work of breathing GI: soft, nontender, nondistended, + BS MS: no deformity or atrophy  Skin: warm and dry, no rash Neuro:  Strength and sensation are intact Psych: euthymic mood, full affect    Recent Labs: 07/22/2016: ALT 15; BUN 19; Creatinine, Ser 0.47; Hemoglobin 13.6; Platelets 640; Potassium 4.1; Sodium 137    Lipid Panel Lab Results  Component Value Date   CHOL 153 06/28/2015   HDL 60.20 06/28/2015   LDLCALC 75 06/28/2015   TRIG 90.0 06/28/2015      Wt Readings from Last 3 Encounters:  12/24/16 141 lb 8 oz (64.2 kg)  10/16/16 139 lb 4 oz (63.2 kg)  07/28/16 135 lb 4 oz (61.3 kg)       ASSESSMENT AND PLAN:  Paroxysmal supraventricular tachycardia ([facility]) - Plan: EKG 12-Lead Denies having any episodes of tachycardia concerning for SVT  Leg swelling Etiology likely a combination of venous insufficiency, possibly now mixed with component of diastolic CHF. Likely high salt intake from neighbors providing food, high fluid intake. Recommended she take Lasix as  needed for ankle swelling  PVC's (premature ventricular contractions) Continues to have symptomatic PVCs. Tried numerous medications in the past, had various side effects. Suggested she stay with her bystolic either half dose or full dose.  Essential hypertension Blood pressure mildly elevated today, possibly secondary to fluid retention Suggested she monitor blood pressure at home with her blood pressure cuff, also try Lasix  Chronic venous insufficiency Recommended compression hose, leg elevation  Episodic mood disorder ([place]) Long discussion concerning her husband who is in memory care This is brought different stressors Perhaps more depressed, living alone   Total encounter time more than 25 minutes  Greater than 50% was spent in counseling and coordination of care with the patient   Disposition:   F/U  6 months   Orders Placed This Encounter  Procedures  . EKG 12-Lead     Signed, Esmond Plants, M.D., Ph.D. 12/24/2016  [place], [place]

## 2016-12-24 NOTE — Patient Instructions (Addendum)
Medication Instructions:   No medication changes made  Please take lasix/furosemide as needed for leg swelling Leg elevation, Compression hose, movement/walking Do not sit for long periods of time  Stress can make the palpitations worse Stay on at least 1/2 dose bystolic  Goal for blood pressure is <140  Labwork:  No new labs needed  Testing/Procedures:  No further testing at this time   I recommend watching educational videos on topics of interest to you at:       [email or web address]  Enter code: [email or web address]    Follow-Up: It was a pleasure seeing you in the office today. Please call us if you have new issues that need to be addressed before your next appt.  [phone]  Your physician wants you to follow-up in: 6 months.  You will receive a reminder letter in the mail two months in advance. If you don't receive a letter, please call our office to schedule the follow-up appointment.  If you need a refill on your cardiac medications before your next appointment, please call your pharmacy.

## 2017-01-11 ENCOUNTER — Encounter: Payer: Self-pay | Admitting: Podiatry

## 2017-01-11 ENCOUNTER — Ambulatory Visit (INDEPENDENT_AMBULATORY_CARE_PROVIDER_SITE_OTHER): Payer: Medicare Other | Admitting: Podiatry

## 2017-01-11 DIAGNOSIS — M79609 Pain in unspecified limb: Secondary | ICD-10-CM | POA: Diagnosis not present

## 2017-01-11 DIAGNOSIS — B351 Tinea unguium: Secondary | ICD-10-CM | POA: Diagnosis not present

## 2017-01-11 NOTE — Progress Notes (Signed)
Complaint:  Visit Type: Patient returns to my office for continued preventative foot care services. Complaint: Patient states" my nails have grown long and thick and become painful to walk and wear shoes" . The patient presents for preventative foot care services. No changes to ROS  Podiatric Exam: Vascular: dorsalis pedis and posterior tibial pulses are palpable bilateral. Capillary return is immediate. Temperature gradient is WNL. Skin turgor WNL  Sensorium: Normal Semmes Weinstein monofilament test. Normal tactile sensation bilaterally. Nail Exam: Pt has thick disfigured discolored nails with subungual debris noted bilateral entire nail hallux through fifth toenails Ulcer Exam: There is no evidence of ulcer or pre-ulcerative changes or infection. Orthopedic Exam: Muscle tone and strength are WNL. No limitations in general ROM. No crepitus or effusions noted. Foot type and digits show no abnormalities. Bony prominences are unremarkable. Skin: No Porokeratosis. No infection or ulcers  Diagnosis:  Onychomycosis, , Pain in right toe, pain in left toes  Treatment & Plan Procedures and Treatment: Consent by patient was obtained for treatment procedures. The patient understood the discussion of treatment and procedures well. All questions were answered thoroughly reviewed. Debridement of mycotic and hypertrophic toenails, 1 through 5 bilateral and clearing of subungual debris. No ulceration, no infection noted.  Return Visit-Office Procedure: Patient instructed to return to the office for a follow up visit 3 months   for continued evaluation and treatment.    Tramell Piechota DPM 

## 2017-01-12 ENCOUNTER — Ambulatory Visit: Payer: Medicare Other | Admitting: Podiatry

## 2017-03-10 DIAGNOSIS — H01003 Unspecified blepharitis right eye, unspecified eyelid: Secondary | ICD-10-CM | POA: Diagnosis not present

## 2017-03-29 ENCOUNTER — Telehealth: Payer: Self-pay | Admitting: Internal Medicine

## 2017-03-29 NOTE — Telephone Encounter (Signed)
Please check on her May need to get her in [place]

## 2017-03-29 NOTE — Telephone Encounter (Signed)
[facility] called - pt called with heart skipping beats and feet swelling during nightt, and legs are weak off and on.  [facility] will send report over.   Thank you

## 2017-03-29 NOTE — Telephone Encounter (Signed)
Patient Name: Shelly Padilla  DOB: [birth year]    Initial Comment States BP has been up the last 2 days. Friday: 168/186 This Morning: 150/180 has been feeling unsteady.   Nurse Assessment  Nurse: Genoveva Ill, RN, Lattie Haw Date/Time (Eastern Time): 03/29/2017 4:42:25 PM  Confirm and document reason for call. If symptomatic, describe symptoms. ---caller states bp has been up the past few days; states it was checked around 11 this am by nurse 150/80 (unable to check bp now) ; during the night her heart skips beats and legs feel weak intermittently over past few wks, but not now  Does the patient have any new or worsening symptoms? ---Yes  Will a triage be completed? ---Yes  Related visit to physician within the last 2 weeks? ---No  Does the PT have any chronic conditions? (i.e. diabetes, asthma, etc.) ---Yes  List chronic conditions. ---HTN  Is this a behavioral health or substance abuse call? ---No     Guidelines    Guideline Title Affirmed Question Affirmed Notes  Heart Rate and Heartbeat Questions Dizziness, lightheadedness, or weakness    Final Disposition User   Go to ED Now [place], RN, Lattie Haw    Comments  CALLER REFUSES ED; ADVISED WILL PLACE NOTE ON CHART AND HAVE SOMEONE CALL BACK; UNDERSTANDING VERB AND STATES DR.LETVAC CALLED AFTER HER HUSBAND DIED, BUT SHE MISSED THE CALL AND WANTS HIM TO CALL HER BACK; SHE IS VERY APPRECIATIVE OF HIM CALLING  CALLED BACK LINE AND REPORTED TO MELISSA; STATES SHE WILL SEND MESSAGE TO PROVIDER   Referrals  GO TO FACILITY REFUSED   Disagree/Comply: Comply

## 2017-03-30 NOTE — Telephone Encounter (Signed)
Spoke to pt who states she "is feeling pretty good." She is scheduled 4/18

## 2017-03-30 NOTE — Telephone Encounter (Signed)
Yes---okay to wait for tomorrow

## 2017-03-30 NOTE — Telephone Encounter (Signed)
I spoke with pt and she scheduled appt with Dr Silvio Pate 03/31/17 at 12:15.

## 2017-03-30 NOTE — Telephone Encounter (Signed)
Will evaluate at [facility] tomorrow This seems to be okay---doesn't appear to be emergency

## 2017-03-31 ENCOUNTER — Encounter: Payer: Self-pay | Admitting: Internal Medicine

## 2017-03-31 ENCOUNTER — Ambulatory Visit (INDEPENDENT_AMBULATORY_CARE_PROVIDER_SITE_OTHER): Payer: Medicare Other | Admitting: Internal Medicine

## 2017-03-31 VITALS — BP 132/74 | HR 71 | Temp 97.5°F | Wt 141.0 lb

## 2017-03-31 DIAGNOSIS — F39 Unspecified mood [affective] disorder: Secondary | ICD-10-CM | POA: Diagnosis not present

## 2017-03-31 DIAGNOSIS — I10 Essential (primary) hypertension: Secondary | ICD-10-CM

## 2017-03-31 MED ORDER — HYDROCHLOROTHIAZIDE 12.5 MG PO TABS: 12.5000 mg | ORAL_TABLET | Freq: Every day | ORAL | 3 refills | Status: DC

## 2017-03-31 NOTE — Assessment & Plan Note (Signed)
Now with some grieving--but doing well A move to [place] will give her social contacts that she needs

## 2017-03-31 NOTE — Assessment & Plan Note (Addendum)
Has not tolerated the amlodipine with edema Will change to low dose HCTZ Won't need the furosemide Discussed potential side effects

## 2017-03-31 NOTE — Progress Notes (Signed)
Pre visit review using our clinic review tool, if applicable. No additional management support is needed unless otherwise documented below in the visit note. 

## 2017-03-31 NOTE — Patient Instructions (Signed)
Stop the amlodipine and start the HCTZ daily. You should not need the furosemide fluid pill any more

## 2017-03-31 NOTE — Progress Notes (Signed)
Subjective:    Patient ID: Shelly Padilla, female    DOB: 28-Aug-1920, 83 y.o.   MRN: [id number]  HPI Here due to blood pressure problems Reviewed the phone notes  She had cut back on the amlodipine to every other day It was causing leg swelling BP up when not taking Edema related to the amlodipine Has the furosemide but not excited about taking it  Had some pain across entire chest yesterday Lasted for 4 minutes--just when sitting No SOB, diaphoresis or nausea Some scattered dizzy feelings Feels good today  Current Outpatient Prescriptions on File Prior to Visit  Medication Sig Dispense Refill  . amLODipine (NORVASC) 2.5 MG tablet Take 1 tablet (2.5 mg total) by mouth daily. 30 tablet 11  . aspirin 81 MG tablet Take 81 mg by mouth daily.    . furosemide (LASIX) 20 MG tablet Take 1 tablet (20 mg total) by mouth daily as needed. 30 tablet 6  . ketoconazole (NIZORAL) 2 % cream Apply 1 application topically 2 (two) times daily as needed for irritation. 60 g 2  . Multiple Vitamin (MULTIVITAMIN) tablet Take 1 tablet by mouth daily.    . Multiple Vitamins-Minerals (CENTRUM SILVER PO) Take 1 tablet by mouth daily.     . nebivolol (BYSTOLIC) 5 MG tablet Take 1 tablet (5 mg total) by mouth daily. 30 tablet 3  . ramipril (ALTACE) 10 MG capsule TAKE ONE CAPSULE BY MOUTH ONCE DAILY 90 capsule 3  . timolol (TIMOPTIC) 0.5 % ophthalmic solution Place 1 drop into both eyes daily.     Marland Kitchen zolpidem (AMBIEN) 5 MG tablet TAKE ONE-HALF TO ONE TABLET BY MOUTH AT BEDTIME AS NEEDED FOR SLEEP *LIMIT  TO  ONE  TO  TWO  PER  WEEK* 30 tablet 0   No current facility-administered medications on file prior to visit.     Allergies  Allergen Reactions  . Amoxil [Amoxicillin] Anxiety  . Codeine   . Pacerone  [Amiodarone Hcl] Nausea And Vomiting  . Sulfa Drugs Cross Reactors     Past Medical History:  Diagnosis Date  . Atrial fibrillation ([place])   . Brain tumor ([place])    meningioma  . Glaucoma   . H/O  paroxysmal supraventricular tachycardia    documented by Holter Monitor  . Hemorrhoids   . Hiatal hernia   . History of ovarian cyst   . HTN (hypertension)   . Insomnia   . Palpitations   . Thrombocytosis ([place])     Past Surgical History:  Procedure Laterality Date  . CATARACT EXTRACTION     left eye  . Leg vein stripping  1970  . TONSILLECTOMY  1929    Family History  Problem Relation Age of Onset  . Heart attack Father   . Heart failure Brother     Social History   Social History  . Marital status: Widowed    Spouse name: N/A  . Number of children: 1  . Years of education: N/A   Occupational History  . retired     Network engineer   Social History Main Topics  . Smoking status: Never Smoker  . Smokeless tobacco: Never Used  . Alcohol use 0.0 oz/week     Comment: occasionally wine  . Drug use: No  . Sexual activity: Not on file   Other Topics Concern  . Not on file   Social History Narrative   Primary care giver of husband with Alzheimer's--now in memory care      Has  living will   Son is health care POA   Has DNR   No tube feeds if cognitively unaware   Review of Systems Legs not "strong or steady" Cut out salt Appetite is good Feels heart skips beats if she checks her pulse when in bed. Gets up and walks and it goes away (discussed this can be normal) Plans to move [place] soon    Objective:   Physical Exam  Constitutional: She appears well-nourished. No distress.  Neck: No thyromegaly present.  Cardiovascular: Normal rate, regular rhythm and normal heart sounds.  Exam reveals no gallop.   No murmur heard. Pulmonary/Chest: Effort normal and breath sounds normal. No respiratory distress. She has no wheezes. She has no rales.  Abdominal: Soft. There is no tenderness.  Musculoskeletal:  Trace edema in ankles  Lymphadenopathy:    She has no cervical adenopathy.  Psychiatric: She has a normal mood and affect. Her behavior is normal.            Assessment & Plan:

## 2017-04-05 ENCOUNTER — Ambulatory Visit: Payer: Medicare Other | Admitting: Podiatry

## 2017-04-08 ENCOUNTER — Telehealth: Payer: Self-pay

## 2017-04-08 NOTE — Telephone Encounter (Signed)
Patient aware of samples up front for Bystolic 5mg .  4 bottles  Lot number: [id number] Expiration date: 04/19

## 2017-04-08 NOTE — Telephone Encounter (Signed)
Pt would like Bystolic samples. Please call.

## 2017-04-22 ENCOUNTER — Telehealth: Payer: Self-pay

## 2017-04-22 NOTE — Telephone Encounter (Signed)
Yes--I will have to sign it but they should be able to fill it out

## 2017-04-22 NOTE — Telephone Encounter (Signed)
Debbie at [facility] assisted living said that pt is transferring from independent living at [facility] to assisted living and she will ask social worker to fill out FL2 form; if needs any additional info on FL2 will have the form sent to Dr Silvio Pate. No cb needed. FYI to Dr Silvio Pate.

## 2017-04-23 ENCOUNTER — Telehealth: Payer: Self-pay | Admitting: Internal Medicine

## 2017-04-23 NOTE — Telephone Encounter (Signed)
Patient called to cancel her f/u appointment with Dr.Letvak on 04/27/17.  Patient said she's moving in to [facility].  Patient,also,has an appointment for her AWV in July. I cancelled the appointment on 04/27/17.  Will patient still be coming to our office? Patient said a detailed message can be left on her answering machine.

## 2017-04-23 NOTE — Telephone Encounter (Signed)
Patient notified and appointment cancelled.

## 2017-04-23 NOTE — Telephone Encounter (Signed)
No --cancel all visits here. We will see her in her apartment at assisted living

## 2017-04-27 ENCOUNTER — Ambulatory Visit: Payer: Medicare Other | Admitting: Internal Medicine

## 2017-05-17 ENCOUNTER — Telehealth: Payer: Self-pay

## 2017-05-17 NOTE — Telephone Encounter (Signed)
Pt would like Bystolic samples . Pt states it is ok to leave a message on her VM

## 2017-05-17 NOTE — Telephone Encounter (Signed)
Patient aware of samples being placed up front.   2 boxes  LOT # [id number] EXP: 4/20  2 boxes LOT # [id number] EXP: 12/19

## 2017-06-10 ENCOUNTER — Non-Acute Institutional Stay: Payer: Medicare Other | Admitting: Internal Medicine

## 2017-06-10 ENCOUNTER — Encounter: Payer: Self-pay | Admitting: Internal Medicine

## 2017-06-10 VITALS — BP 118/60 | HR 64 | Temp 98.4°F | Resp 20 | Wt 129.2 lb

## 2017-06-10 DIAGNOSIS — I10 Essential (primary) hypertension: Secondary | ICD-10-CM

## 2017-06-10 DIAGNOSIS — D329 Benign neoplasm of meninges, unspecified: Secondary | ICD-10-CM | POA: Diagnosis not present

## 2017-06-10 DIAGNOSIS — I872 Venous insufficiency (chronic) (peripheral): Secondary | ICD-10-CM

## 2017-06-10 DIAGNOSIS — I471 Supraventricular tachycardia, unspecified: Secondary | ICD-10-CM

## 2017-06-10 DIAGNOSIS — F39 Unspecified mood [affective] disorder: Secondary | ICD-10-CM

## 2017-06-10 DIAGNOSIS — D75839 Thrombocytosis, unspecified: Secondary | ICD-10-CM

## 2017-06-10 DIAGNOSIS — D473 Essential (hemorrhagic) thrombocythemia: Secondary | ICD-10-CM | POA: Diagnosis not present

## 2017-06-10 NOTE — Assessment & Plan Note (Signed)
Chronic mild edema On HCTZ for BP also

## 2017-06-10 NOTE — Assessment & Plan Note (Signed)
BP Readings from Last 3 Encounters:  06/10/17 118/60  03/31/17 132/74  12/24/16 (!) 158/82   Good control Will order yearly labs for her meds

## 2017-06-10 NOTE — Assessment & Plan Note (Signed)
Small lesion with some swelling found in 2015 No acute symptoms so we will not pursue this

## 2017-06-10 NOTE — Assessment & Plan Note (Addendum)
Adjusting to husband's recent death and move to AL fairly well No meds for this other than zolpidem prn for some sleep issues

## 2017-06-10 NOTE — Assessment & Plan Note (Signed)
No apparent spells on the bystolic

## 2017-06-10 NOTE — Assessment & Plan Note (Signed)
No evidence of thrombosis Will recheck labs

## 2017-06-10 NOTE — Progress Notes (Signed)
Subjective:    Patient ID: Shelly Padilla, female    DOB: 1920/08/21, 83 y.o.   MRN: [id number]  HPI  Initial visit since she moved into her apartment here in assisted living Has adjusted here well  Still adjusting to the move Having trouble finding things from the move to a small place Doing okay since husband's death Overall happy with her move her--not excited about the pharmacy service Has intermittent sleep problems--- uses the zolpidem occasionally No persistent depressed mood  No chest pain  No palpitations No SOB Has done some of the exercises--- "exhausting" [place]  No leg swelling or pain No apparent blood clots No weakness or stroke type symptoms  Current Outpatient Prescriptions on File Prior to Visit  Medication Sig Dispense Refill  . aspirin 81 MG tablet Take 81 mg by mouth daily.    . hydrochlorothiazide (HYDRODIURIL) 12.5 MG tablet Take 1 tablet (12.5 mg total) by mouth daily. 30 tablet 3  . Multiple Vitamins-Minerals (CENTRUM SILVER PO) Take 1 tablet by mouth daily.     . nebivolol (BYSTOLIC) 5 MG tablet Take 1 tablet (5 mg total) by mouth daily. 30 tablet 3  . ramipril (ALTACE) 10 MG capsule TAKE ONE CAPSULE BY MOUTH ONCE DAILY 90 capsule 3  . timolol (TIMOPTIC) 0.5 % ophthalmic solution Place 1 drop into both eyes daily.     Marland Kitchen zolpidem (AMBIEN) 5 MG tablet TAKE ONE-HALF TO ONE TABLET BY MOUTH AT BEDTIME AS NEEDED FOR SLEEP *LIMIT  TO  ONE  TO  TWO  PER  WEEK* 30 tablet 0   No current facility-administered medications on file prior to visit.     Allergies  Allergen Reactions  . Amoxil [Amoxicillin] Anxiety  . Codeine   . Pacerone  [Amiodarone Hcl] Nausea And Vomiting  . Sulfa Drugs Cross Reactors   . Amlodipine Other (See Comments)    Mild edema    Past Medical History:  Diagnosis Date  . Atrial fibrillation ([place])   . Brain tumor ([place])    meningioma  . Glaucoma   . H/O paroxysmal supraventricular tachycardia    documented by Holter Monitor  . Hemorrhoids   . Hiatal hernia   . History of ovarian cyst   . HTN (hypertension)   . Insomnia   . Palpitations   . Thrombocytosis ([place])     Past Surgical History:  Procedure Laterality Date  . CATARACT EXTRACTION     left eye  . Leg vein stripping  1970  . TONSILLECTOMY  1929    Family History  Problem Relation Age of Onset  . Heart attack Father   . Heart failure Brother     Social History   Social History  . Marital status: Widowed    Spouse name: N/A  . Number of children: 1  . Years of education: N/A   Occupational History  . retired     Network engineer   Social History Main Topics  . Smoking status: Never Smoker  . Smokeless tobacco: Never Used  . Alcohol use 0.0 oz/week     Comment: occasionally wine  . Drug use: No  . Sexual activity: Not on file   Other Topics Concern  . Not on file   Social History Narrative   Primary care giver of husband with Alzheimer's--now in memory care      Has living will   Son is health care POA   Has DNR   No tube feeds if  cognitively unaware   Review of Systems Appetite is good--eating well here She thinks she gained weight---will have them recheck her weight here More forgetful Bowels are okay    Objective:   Physical Exam  Constitutional: She appears well-nourished. No distress.  Neck: No thyromegaly present.  Cardiovascular: Normal rate, regular rhythm and normal heart sounds.  Exam reveals no gallop.   No murmur heard. occ skips  Pulmonary/Chest: Effort normal and breath sounds normal. No respiratory distress. She has no wheezes. She has no rales.  Abdominal: Soft. There is no tenderness.  Musculoskeletal:  Trace to 1+ ankle/foot edema  Lymphadenopathy:    She has no cervical adenopathy.  Psychiatric: She has a normal mood and affect.          Assessment & Plan:

## 2017-06-14 DIAGNOSIS — I482 Chronic atrial fibrillation: Secondary | ICD-10-CM | POA: Diagnosis not present

## 2017-06-25 NOTE — Progress Notes (Signed)
Cardiology Office Note  Date:  06/29/2017   ID:  Jobe Igo, DOB 11-05-1920, MRN [id number]  PCP:  Venia Carbon, MD   Chief Complaint  Patient presents with  . other    42mo f/u. Pt c/o cp at times, difficulty sleeping, often forgetful; denies sob. Pt states she is undergoing many changes due to recent move and the passing of her husband. Reviewed meds with pt verbally.    HPI:  83 year old female with history of  hypertension ,  insomnia ,  SVT,  APCs and PVCs previous trial of diltiazem, metoprolol, amiodarone  who presents for routine followup of her palpitations and blood pressure .  In follow-up today she reports husband has died, significant stress, moved into assisted living at [facility] He presents with a friend No regular exercise program Leg swelling has been stable. She is now on low-dose HCTZ  Denies any recent falls Uses a walker in her house No near syncope symptoms Hard of hearing  She does have Lasix but has not been taking this, feels it makes her palpitations worse Continued problems with insomnia, chronic issue  EKG on today's visit shows normal sinus rhythm with rate 77 bpm, right bundle branch block  Other past medical history She takes care of her husband who has Alzheimer's. He is in memory daycare several days per week Recent hospitalization August 20 with discharge 08/03/2014 with vision changes, diagnosed with right temporoparietal meningioma 2 x 2 centimeters  She had echocardiogram 08/03/2014 showing normal ejection fraction CT scan of the head showing atrophy and chronic small vessel disease MRI of the brain showing 2 cm right posterior temporal meningioma Carotid ultrasound showing mild bilateral atherosclerosis  Lab work showing total cholesterol 120, LDL 59 Medical management was recommended for her meningioma Per the patient. She has followup neurosurgery  Holter monitor November 2011 showed paroxysmal SVT.   PMH:    has a past medical history of Atrial fibrillation ([place]); Brain tumor ([place]); Glaucoma; H/O paroxysmal supraventricular tachycardia; Hemorrhoids; Hiatal hernia; History of ovarian cyst; HTN (hypertension); Insomnia; Palpitations; and Thrombocytosis ([place]).  PSH:    Past Surgical History:  Procedure Laterality Date  . CATARACT EXTRACTION     left eye  . Leg vein stripping  1970  . TONSILLECTOMY  1929    Current Outpatient Prescriptions  Medication Sig Dispense Refill  . aspirin 81 MG tablet Take 81 mg by mouth daily.    . hydrochlorothiazide (HYDRODIURIL) 12.5 MG tablet Take 1 tablet (12.5 mg total) by mouth daily. 30 tablet 3  . Multiple Vitamins-Minerals (CENTRUM SILVER PO) Take 1 tablet by mouth daily.     . nebivolol (BYSTOLIC) 5 MG tablet Take 1 tablet (5 mg total) by mouth daily. (Patient taking differently: Take 2.5 mg by mouth daily. ) 30 tablet 3  . ramipril (ALTACE) 10 MG capsule TAKE ONE CAPSULE BY MOUTH ONCE DAILY 90 capsule 3  . timolol (TIMOPTIC) 0.5 % ophthalmic solution Place 1 drop into both eyes daily.     Marland Kitchen zolpidem (AMBIEN) 5 MG tablet TAKE ONE-HALF TO ONE TABLET BY MOUTH AT BEDTIME AS NEEDED FOR SLEEP *LIMIT  TO  ONE  TO  TWO  PER  WEEK* 30 tablet 0   No current facility-administered medications for this visit.      Allergies:   Amoxil [amoxicillin]; Codeine; Pacerone  [amiodarone hcl]; Sulfa drugs cross reactors; and Amlodipine   Social History:  The patient  reports that she has never smoked. She has never used  smokeless tobacco. She reports that she drinks alcohol. She reports that she does not use drugs.   Family History:   family history includes Heart attack in her father; Heart failure in her brother.    Review of Systems: Review of Systems  Constitutional: Negative.   Respiratory: Negative.   Cardiovascular: Positive for leg swelling.  Gastrointestinal: Negative.   Musculoskeletal: Negative.        Gait instability  Neurological: Negative.    Psychiatric/Behavioral: The patient has insomnia.   All other systems reviewed and are negative.    PHYSICAL EXAM: VS:  BP 136/68 (BP Location: Left Arm, Patient Position: Sitting, Cuff Size: Normal)   Pulse 77   Ht 5' 3.5" (1.613 m)   Wt 149 lb 4 oz (67.7 kg)   BMI 26.02 kg/m  , BMI Body mass index is 26.02 kg/m.  GEN: Well nourished, well developed, in no acute distress  HEENT: normal  Neck: no JVD, carotid bruits, or masses Cardiac: RRR; no murmurs, rubs, or gallops, trace pitting edema lower extremities bilaterally Respiratory:  clear to auscultation bilaterally, normal work of breathing GI: soft, nontender, nondistended, + BS MS: no deformity or atrophy  Skin: warm and dry, no rash Neuro:  Strength and sensation are intact Psych: euthymic mood, full affect    Recent Labs: 07/22/2016: ALT 15; BUN 19; Creatinine, Ser 0.47; Hemoglobin 13.6; Platelets 640; Potassium 4.1; Sodium 137    Lipid Panel Lab Results  Component Value Date   CHOL 153 06/28/2015   HDL 60.20 06/28/2015   LDLCALC 75 06/28/2015   TRIG 90.0 06/28/2015      Wt Readings from Last 3 Encounters:  06/29/17 149 lb 4 oz (67.7 kg)  06/10/17 129 lb 3.2 oz (58.6 kg)  03/31/17 141 lb (64 kg)       ASSESSMENT AND PLAN:  Paroxysmal supraventricular tachycardia ([place]) - Plan: EKG 12-Lead Denies having any episodes of tachycardia concerning for SVT  Leg swelling Currently on compression hose, HCTZ Recommended leg elevation  PVC's (premature ventricular contractions) Denies having any symptomatic PVCs Bill continue low-dose beta blocker  Essential hypertension Blood pressure is well controlled on today's visit. No changes made to the medications.  Chronic venous insufficiency Recommended compression hose, leg elevation  Episodic mood disorder (HCC) Adjustment disorder after loss of her husband    Total encounter time more than 25 minutes  Greater than 50% was spent in counseling and  coordination of care with the patient   Disposition:   F/U  12 months as needed   Orders Placed This Encounter  Procedures  . EKG 12-Lead     Signed, Esmond Plants, M.D., Ph.D. 06/29/2017  [place], [place]

## 2017-06-29 ENCOUNTER — Encounter: Payer: Self-pay | Admitting: Cardiovascular Disease

## 2017-06-29 ENCOUNTER — Ambulatory Visit (INDEPENDENT_AMBULATORY_CARE_PROVIDER_SITE_OTHER): Payer: Medicare Other | Admitting: Cardiovascular Disease

## 2017-06-29 VITALS — BP 136/68 | HR 77 | Ht 63.5 in | Wt 149.2 lb

## 2017-06-29 DIAGNOSIS — I471 Supraventricular tachycardia: Secondary | ICD-10-CM

## 2017-06-29 DIAGNOSIS — I872 Venous insufficiency (chronic) (peripheral): Secondary | ICD-10-CM

## 2017-06-29 DIAGNOSIS — F39 Unspecified mood [affective] disorder: Secondary | ICD-10-CM | POA: Diagnosis not present

## 2017-06-29 DIAGNOSIS — I10 Essential (primary) hypertension: Secondary | ICD-10-CM | POA: Diagnosis not present

## 2017-06-29 NOTE — Patient Instructions (Addendum)
Ask Dr. Silvio Pate about trazodone   Medication Instructions:   No medication changes made  Labwork:  No new labs needed  Testing/Procedures:  No further testing at this time   Follow-Up: It was a pleasure seeing you in the office today. Please call us if you have new issues that need to be addressed before your next appt.  [phone]  Your physician wants you to follow-up in: 12 months as needed You will receive a reminder letter in the mail two months in advance. If you don't receive a letter, please call our office to schedule the follow-up appointment.  If you need a refill on your cardiac medications before your next appointment, please call your pharmacy.

## 2017-07-02 ENCOUNTER — Encounter: Payer: Medicare Other | Admitting: Internal Medicine

## 2017-07-05 ENCOUNTER — Encounter: Payer: Medicare Other | Admitting: Internal Medicine

## 2017-07-07 ENCOUNTER — Encounter: Payer: Medicare Other | Admitting: Internal Medicine

## 2017-07-10 ENCOUNTER — Emergency Department: Payer: Medicare Other

## 2017-07-10 ENCOUNTER — Encounter: Payer: Self-pay | Admitting: Emergency Medicine

## 2017-07-10 ENCOUNTER — Emergency Department
Admission: EM | Admit: 2017-07-10 | Discharge: 2017-07-10 | Disposition: A | Discharge status: Home / Self Care | Payer: Medicare Other | Attending: Emergency Medicine | Admitting: Emergency Medicine

## 2017-07-10 DIAGNOSIS — Y929 Unspecified place or not applicable: Secondary | ICD-10-CM | POA: Diagnosis not present

## 2017-07-10 DIAGNOSIS — W01198A Fall on same level from slipping, tripping and stumbling with subsequent striking against other object, initial encounter: Secondary | ICD-10-CM | POA: Insufficient documentation

## 2017-07-10 DIAGNOSIS — S0990XA Unspecified injury of head, initial encounter: Secondary | ICD-10-CM | POA: Diagnosis not present

## 2017-07-10 DIAGNOSIS — Z79899 Other long term (current) drug therapy: Secondary | ICD-10-CM | POA: Insufficient documentation

## 2017-07-10 DIAGNOSIS — Z7982 Long term (current) use of aspirin: Secondary | ICD-10-CM | POA: Diagnosis not present

## 2017-07-10 DIAGNOSIS — I10 Essential (primary) hypertension: Secondary | ICD-10-CM | POA: Diagnosis not present

## 2017-07-10 DIAGNOSIS — W19XXXA Unspecified fall, initial encounter: Secondary | ICD-10-CM

## 2017-07-10 DIAGNOSIS — Y999 Unspecified external cause status: Secondary | ICD-10-CM | POA: Insufficient documentation

## 2017-07-10 DIAGNOSIS — S0191XA Laceration without foreign body of unspecified part of head, initial encounter: Secondary | ICD-10-CM | POA: Insufficient documentation

## 2017-07-10 DIAGNOSIS — Y93E9 Activity, other interior property and clothing maintenance: Secondary | ICD-10-CM | POA: Insufficient documentation

## 2017-07-10 MED ORDER — ACETAMINOPHEN 325 MG PO TABS
650.0000 mg | ORAL_TABLET | Freq: Once | ORAL | Status: AC
  Administered 2017-07-10: 650 mg via ORAL
  Filled 2017-07-10: qty 2

## 2017-07-10 NOTE — ED Triage Notes (Signed)
Pt was standing and trying to put her shoes on and fell back and hit her head on window sill. Wound cleansed during triage and abrasion noted with small hematoma

## 2017-07-10 NOTE — ED Provider Notes (Signed)
[facility] Emergency Department Provider Note  Time seen: 7:57 PM  I have reviewed the triage vital signs and the nursing notes.   HISTORY  Chief Complaint Head Laceration    HPI Shelly Padilla is a 83 y.o. female who presents to the emergency department after a fall. Patient states she was attempting to put her shoes on while standing and she fell backwards hitting the back of her head on a windowsill. Denies passing out. Denies nausea or vomiting. States a mild amount of bleeding at the time. She was able to stand up and sit back down on the bed. States she has been ambulatory since the fall without difficulty. Denies any other pain. States a mild headache currently.  Past Medical History:  Diagnosis Date  . Atrial fibrillation ([place])   . Brain tumor ([place])    meningioma  . Glaucoma   . H/O paroxysmal supraventricular tachycardia    documented by Holter Monitor  . Hemorrhoids   . Hiatal hernia   . History of ovarian cyst   . HTN (hypertension)   . Insomnia   . Palpitations   . Thrombocytosis [facility])     Patient Active Problem List   Diagnosis Date Noted  . Chronic venous insufficiency 06/09/2016  . Right inguinal hernia 06/09/2016  . DD (diverticular disease) 04/10/2016  . Glaucoma 04/10/2016  . Hemorrhoid 04/10/2016  . Bilateral ovarian cysts 03/18/2016  . Thrombocytosis ([place])   . Preventative health care 06/28/2015  . Varicose veins 01/31/2015  . Advance directive discussed with patient 12/26/2014  . Meningioma ([place]) 10/01/2014  . Insomnia 05/18/2014  . Episodic mood disorder ([place]) 10/18/2012  . Paroxysmal supraventricular tachycardia ([place]) 03/08/2012  . HTN (hypertension) 03/08/2012  . PVC's (premature ventricular contractions) 03/08/2012    Past Surgical History:  Procedure Laterality Date  . CATARACT EXTRACTION     left eye  . Leg vein stripping  1970  . TONSILLECTOMY  1929    Prior to Admission medications   Medication Sig  Start Date End Date Taking? Authorizing Provider  aspirin 81 MG tablet Take 81 mg by mouth daily.    [provider]  hydrochlorothiazide (HYDRODIURIL) 12.5 MG tablet Take 1 tablet (12.5 mg total) by mouth daily. 03/31/17   Venia Carbon, MD  Multiple Vitamins-Minerals (CENTRUM SILVER PO) Take 1 tablet by mouth daily.     [provider]  nebivolol (BYSTOLIC) 5 MG tablet Take 1 tablet (5 mg total) by mouth daily. Patient taking differently: Take 2.5 mg by mouth daily.  01/08/16   Dunn, Areta Haber, PA-C  ramipril (ALTACE) 10 MG capsule TAKE ONE CAPSULE BY MOUTH ONCE DAILY 10/07/16   Minna Merritts, MD  timolol (TIMOPTIC) 0.5 % ophthalmic solution Place 1 drop into both eyes daily.  04/29/15   [provider]  zolpidem (AMBIEN) 5 MG tablet TAKE ONE-HALF TO ONE TABLET BY MOUTH AT BEDTIME AS NEEDED FOR SLEEP *LIMIT  TO  ONE  TO  TWO  PER  WEEK* 10/07/16   Venia Carbon, MD    Allergies  Allergen Reactions  . Amoxil [Amoxicillin] Anxiety  . Codeine   . Pacerone  [Amiodarone Hcl] Nausea And Vomiting  . Sulfa Drugs Cross Reactors   . Amlodipine Other (See Comments)    Mild edema    Family History  Problem Relation Age of Onset  . Heart attack Father   . Heart failure Brother     Social History Social History  Substance Use Topics  .  Smoking status: Never Smoker  . Smokeless tobacco: Never Used  . Alcohol use 0.0 oz/week     Comment: occasionally wine    Review of Systems Constitutional: Negative for Loss of consciousness. Cardiovascular: Negative for chest pain. Respiratory: Negative for shortness of breath. Gastrointestinal: Negative for abdominal pain Musculoskeletal: Negative for back pain. Negative for neck pain. Negative for extremity pain. Skin: Abrasions of the back of the head Neurological: Mild headache. Denies focal weakness or numbness. All other ROS negative  ____________________________________________   PHYSICAL EXAM:  VITAL  SIGNS: ED Triage Vitals  Enc Vitals Group     BP 07/10/17 1942 (!) 173/77     Pulse --      Resp --      Temp 07/10/17 1942 98.6 F (37 C)     Temp src --      SpO2 --      Weight --      Height --      Head Circumference --      Peak Flow --      Pain Score 07/10/17 1941 6     Pain Loc --      Pain Edu? --      Excl. in [place]? --     Constitutional: Alert and oriented. Well appearing and in no distress. Eyes: Normal exam ENT   Head: Normocephalic and atraumatic.   Mouth/Throat: Mucous membranes are moist. Cardiovascular: Normal rate, regular rhythm.  Respiratory: Normal respiratory effort without tachypnea nor retractions. Breath sounds are clear Gastrointestinal: Soft and nontender. No distention.   Musculoskeletal: Nontender with normal range of motion in all extremities. No lower extremity tenderness or edema. Good range of motion in all extremities and all joints. Pelvis is stable. Neurologic:  Normal speech and language. No gross focal neurologic deficits  Skin:  Skin is warm, dry and intact.  Psychiatric: Mood and affect are normal.   ____________________________________________     RADIOLOGY  CT negative for acute abnormality  ____________________________________________   INITIAL IMPRESSION / ASSESSMENT AND PLAN / ED COURSE  Pertinent labs & imaging results that were available during my care of the patient were reviewed by me and considered in my medical decision making (see chart for details).  Patient presents to the emergency department after a fall. Patient states she was getting ready for her 89th birthday dinner when she fell backwards hitting the back of her head. No LOC nausea or vomiting. Patient has a small abrasion to the occipital scalp with a small hematoma. No laceration, nothing to suture/staple. Overall the patient appears well, no neck or back tenderness. Good range of motion in all extremities has been ambulatory since the fall. We will  treat with Tylenol taken a CT scan of the head and if normal patient will be discharged home. Patient's son is in the emergency department with her.  ____________________________________________   FINAL CLINICAL IMPRESSION(S) / ED DIAGNOSES  Fall Head injury    Harvest Dark, MD 07/11/17 2028

## 2017-07-10 NOTE — ED Provider Notes (Signed)
CT head IMPRESSION: No acute intracranial abnormality.  Moderate brain parenchymal volume loss and chronic microvascular Disease.  Discussed this finding with the patient. Will discharge with paperwork prepared by Dr. Lisa Roca, MD 07/10/17 2231

## 2017-07-10 NOTE — ED Notes (Signed)
Pt's guardian unable to sign - e-signature is not responding

## 2017-07-12 ENCOUNTER — Telehealth: Payer: Self-pay

## 2017-07-12 NOTE — Telephone Encounter (Signed)
Left message for Shelly Padilla at [facility] to see how she is doing after her recent ER trip for a laceration on her hand.

## 2017-07-16 ENCOUNTER — Ambulatory Visit: Payer: Medicare Other | Admitting: Internal Medicine

## 2017-07-16 ENCOUNTER — Telehealth: Payer: Self-pay | Admitting: Internal Medicine

## 2017-07-16 VITALS — BP 140/72 | HR 84 | Resp 18

## 2017-07-16 DIAGNOSIS — S1191XD Laceration without foreign body of unspecified part of neck, subsequent encounter: Secondary | ICD-10-CM | POA: Diagnosis not present

## 2017-07-16 DIAGNOSIS — S0191XD Laceration without foreign body of unspecified part of head, subsequent encounter: Secondary | ICD-10-CM | POA: Diagnosis not present

## 2017-07-16 DIAGNOSIS — W1800XA Striking against unspecified object with subsequent fall, initial encounter: Secondary | ICD-10-CM | POA: Diagnosis not present

## 2017-07-16 NOTE — Telephone Encounter (Signed)
Patient did not come in for their appointment today for nursing home follow up.  Please let me know if patient needs to be contacted immediately for follow up or no follow up needed. Do you want to charge the NSF?

## 2017-07-17 ENCOUNTER — Encounter: Payer: Self-pay | Admitting: Internal Medicine

## 2017-07-17 NOTE — Patient Instructions (Signed)

## 2017-07-17 NOTE — Progress Notes (Signed)
Subjective:    Patient ID: Shelly Padilla, female    DOB: [birth year], 83 y.o.   MRN: [id number]  HPI  Asked to see pt in ALF room 207 She was getting ready for her 89th birthday party, fell backwards and hit her head on the window sill.  She sustained a laceration to the back of the head with moderate bleeding CT scan of the brain in the ER was negative for any acute issues The laceration did not need repair She was sent back to ALF the same day Since discharge, she reports she has been feeling fine She had a mild headache the first day, relieved with Tylenol She has not noticed any more bleeding. She denies visual changes, nausea, weakness or recurrent falls  Review of Systems      Past Medical History:  Diagnosis Date  . Atrial fibrillation ([employer])   . Brain tumor ([place])    meningioma  . Glaucoma   . H/O paroxysmal supraventricular tachycardia    documented by Holter Monitor  . Hemorrhoids   . Hiatal hernia   . History of ovarian cyst   . HTN (hypertension)   . Insomnia   . Palpitations   . Thrombocytosis ([place])     Current Outpatient Prescriptions  Medication Sig Dispense Refill  . aspirin 81 MG tablet Take 81 mg by mouth daily.    . hydrochlorothiazide (HYDRODIURIL) 12.5 MG tablet Take 1 tablet (12.5 mg total) by mouth daily. 30 tablet 3  . Multiple Vitamins-Minerals (CENTRUM SILVER PO) Take 1 tablet by mouth daily.     . nebivolol (BYSTOLIC) 5 MG tablet Take 1 tablet (5 mg total) by mouth daily. (Patient taking differently: Take 2.5 mg by mouth daily. ) 30 tablet 3  . ramipril (ALTACE) 10 MG capsule TAKE ONE CAPSULE BY MOUTH ONCE DAILY 90 capsule 3  . timolol (TIMOPTIC) 0.5 % ophthalmic solution Place 1 drop into both eyes daily.     Marland Kitchen zolpidem (AMBIEN) 5 MG tablet TAKE ONE-HALF TO ONE TABLET BY MOUTH AT BEDTIME AS NEEDED FOR SLEEP *LIMIT  TO  ONE  TO  TWO  PER  WEEK* 30 tablet 0   No current facility-administered medications for this visit.     Allergies    Allergen Reactions  . Amoxil [Amoxicillin] Anxiety  . Codeine   . Pacerone  [Amiodarone Hcl] Nausea And Vomiting  . Sulfa Drugs Cross Reactors   . Amlodipine Other (See Comments)    Mild edema    Family History  Problem Relation Age of Onset  . Heart attack Father   . Heart failure Brother     Social History   Social History  . Marital status: Widowed    Spouse name: N/A  . Number of children: 1  . Years of education: N/A   Occupational History  . retired     Network engineer   Social History Main Topics  . Smoking status: Never Smoker  . Smokeless tobacco: Never Used  . Alcohol use 0.0 oz/week     Comment: occasionally wine  . Drug use: No  . Sexual activity: Not on file   Other Topics Concern  . Not on file   Social History Narrative   Primary care giver of husband with Alzheimer's--now in memory care      Has living will   Son is health care POA   Has DNR   No tube feeds if cognitively unaware     Constitutional: Denies fever, malaise, fatigue, headache  or abrupt weight changes.  Musculoskeletal: Denies decrease in range of motion, difficulty with gait, muscle pain or joint pain and swelling.  Skin: Pt reports laceration to back of head. Denies redness, rashes, lesions or ulcercations.  Neurological: Denies dizziness, difficulty with memory, difficulty with speech or problems with balance and coordination.    No other specific complaints in a complete review of systems (except as listed in HPI above).  Objective:   Physical Exam  BP 140/72   Pulse 84   Resp 18  Wt Readings from Last 3 Encounters:  07/10/17 150 lb (68 kg)  06/29/17 149 lb 4 oz (67.7 kg)  06/10/17 129 lb 3.2 oz (58.6 kg)    General: Appears her stated age, in NAD. Skin: 1.5 cm linear scabbed laceration noted to posterior scalp. No erythema or drainage noted.  Cardiovascular: Normal rate and rhythm. S1,S2 noted.  No murmur, rubs or gallops noted. Pulmonary/Chest: Normal effort and  positive vesicular breath sounds. No respiratory distress. No wheezes, rales or ronchi noted.  Musculoskeletal: No bony tenderness noted over the cervical spine. Gait slow and steady with the use of the rolling walker.  Neurological: Alert and oriented.   BMET    Component Value Date/Time   NA 137 07/22/2016 1615   NA 139 01/08/2016 1210   NA 141 08/03/2014 0455   K 4.1 07/22/2016 1615   K 3.9 08/03/2014 0455   CL 102 07/22/2016 1615   CL 103 08/03/2014 0455   CO2 28 07/22/2016 1615   CO2 31 08/03/2014 0455   GLUCOSE 150 (H) 07/22/2016 1615   GLUCOSE 80 08/03/2014 0455   BUN 19 07/22/2016 1615   BUN 17 01/08/2016 1210   BUN 12 08/03/2014 0455   CREATININE 0.47 07/22/2016 1615   CREATININE 0.57 (L) 08/03/2014 0455   CALCIUM 8.9 07/22/2016 1615   CALCIUM 8.0 (L) 08/03/2014 0455   GFRNONAA >60 07/22/2016 1615   GFRNONAA >60 08/03/2014 0455   GFRAA >60 07/22/2016 1615   GFRAA >60 08/03/2014 0455    Lipid Panel     Component Value Date/Time   CHOL 153 06/28/2015 1136   CHOL 120 08/03/2014 0455   TRIG 90.0 06/28/2015 1136   TRIG 64 08/03/2014 0455   HDL 60.20 06/28/2015 1136   HDL 48 08/03/2014 0455   CHOLHDL 3 06/28/2015 1136   VLDL 18.0 06/28/2015 1136   VLDL 13 08/03/2014 0455   LDLCALC 75 06/28/2015 1136   LDLCALC 59 08/03/2014 0455    CBC    Component Value Date/Time   WBC 10.6 07/22/2016 1615   RBC 4.36 07/22/2016 1615   HGB 13.6 07/22/2016 1615   HGB 13.3 08/03/2014 0455   HCT 38.4 07/22/2016 1615   HCT 38.5 08/03/2014 0455   PLT 640 (H) 07/22/2016 1615   PLT 467 (H) 08/03/2014 0455   MCV 88.0 07/22/2016 1615   MCV 90 08/03/2014 0455   MCH 31.1 07/22/2016 1615   MCHC 35.3 07/22/2016 1615   RDW 13.2 07/22/2016 1615   RDW 13.2 08/03/2014 0455   LYMPHSABS 1.1 07/01/2016 1118   LYMPHSABS 1.4 08/03/2014 0455   MONOABS 1.0 07/01/2016 1118   MONOABS 1.1 (H) 08/03/2014 0455   EOSABS 0.1 07/01/2016 1118   EOSABS 0.1 08/03/2014 0455   BASOSABS 0.0  07/01/2016 1118   BASOSABS 0.1 08/03/2014 0455    Hgb A1C No results found for: HGBA1C          Assessment & Plan:   ER Follow Up for Mechanical Fall  with Head Laceration:  ER notes, labs and imaging reviewed No s/s of concussion at this time [place] to take Tylenol for headaches prn She does not feel like she needs PT at this time No further interventions needed Will monitor  Will reassess as needed Webb Silversmith, NP

## 2017-07-17 NOTE — Telephone Encounter (Signed)
This was a patient I saw in ALF, she did not miss an appt in the office. She does not need a NSF

## 2017-07-19 NOTE — Telephone Encounter (Signed)
Shelly Padilla called back 07-13-17 and said pt was acting like nothing ever happened.

## 2017-08-09 ENCOUNTER — Telehealth: Payer: Self-pay | Admitting: Cardiovascular Disease

## 2017-08-09 NOTE — Telephone Encounter (Signed)
Samples placed up front.   3 bottles of Bystolic 5MG  LOT: [id number]  EXP:04/19

## 2017-08-09 NOTE — Telephone Encounter (Signed)
Patient calling the office for samples of medication:   1.  What medication and dosage are you requesting samples for? Bystolic  2.  Are you currently out of this medication?  Yes

## 2017-09-02 ENCOUNTER — Telehealth: Payer: Self-pay | Admitting: Cardiovascular Disease

## 2017-09-02 NOTE — Telephone Encounter (Signed)
Pt calling stating the past week during the day time she is having irregular heart beat  Was advise by nurse (where she stays) to call us  She states it normally happens at night and it doesn't bother her much but it's happening more often   Please call back  She states she won't be home around 1130  But we may leave message for her

## 2017-09-02 NOTE — Telephone Encounter (Signed)
Alric Quan, nurse at [facility]. She saw the patient this morning who said she's been having "palpitations" off and on. Patient says it normally happens at night. She would like to go ahead and schedule an appointment for the patient.  Patient scheduled to see Dr Rockey Situ on Monday, 09/06/17. Nurse will call and let the patient know of the appointment date and time.

## 2017-09-04 NOTE — Progress Notes (Signed)
Cardiology Office Note  Date:  09/06/2017   ID:  Shelly Padilla, DOB 16-Jan-1920, MRN [id number]  PCP:  Venia Carbon, MD   Chief Complaint  Patient presents with  . other    Heart palpitations, Edema ankles and flunctuating BP. Meds reviewed verbally with pt.    HPI:  83 year old female with history of  hypertension ,  insomnia ,  temporoparietal meningioma 2 x 2 centimeters SVT,  APCs and PVCs previous trial of diltiazem, metoprolol, amiodarone  Memory problems who presents for routine followup of her palpitations and blood pressure .  In follow-up today she presents with family member husband has died, significant stress, moved into [facility] No regular exercise program Leg swelling has been stable.  Wearing compression hose Denies any recent falls Uses a walker in her house No near syncope symptoms Hard of hearing Problems with insomnia, chronic issue Previously had difficulty tolerating diuretics, felt it made her palpitations worse  Biggest complaint is palpitations in the evening, often after dinner She has been taking very low-dose beta blocker, bystolic 2.5 mg daily Requiring samples, unable to afford the medication Reports having some vision issues, blurry vision seems to come and go  EKG personally reviewed by myself on todays visit Showing normal sinus rhythm with rate 77 beats per minute, Short run of narrow complex tachycardia  Other past medical history hospitalization August 20 with discharge 08/03/2014 with vision changes, diagnosed with right temporoparietal meningioma 2 x 2 centimeters  She had echocardiogram 08/03/2014 showing normal ejection fraction CT scan of the head showing atrophy and chronic small vessel disease MRI of the brain showing 2 cm right posterior temporal meningioma Carotid ultrasound showing mild bilateral atherosclerosis  Lab work showing total cholesterol 120, LDL 59 Medical management was recommended  for her meningioma Per the patient. She has followup neurosurgery  Holter monitor November 2011 showed paroxysmal SVT.   PMH:   has a past medical history of Atrial fibrillation ([place]); Brain tumor ([place]); Glaucoma; H/O paroxysmal supraventricular tachycardia; Hemorrhoids; Hiatal hernia; History of ovarian cyst; HTN (hypertension); Insomnia; Palpitations; and Thrombocytosis ([place]).  PSH:    Past Surgical History:  Procedure Laterality Date  . CATARACT EXTRACTION     left eye  . Leg vein stripping  1970  . TONSILLECTOMY  1929    Current Outpatient Prescriptions  Medication Sig Dispense Refill  . acetaminophen (TYLENOL) 325 MG tablet Take 650 mg by mouth every 6 (six) hours as needed.    Marland Kitchen aspirin 81 MG tablet Take 81 mg by mouth daily.    . Bismuth Subsalicylate (KAOPECTATE PO) Take by mouth as needed.    . carbamide peroxide (DEBROX) 6.5 % OTIC solution 5 drops as needed.    . diphenhydrAMINE (BENADRYL) 25 mg capsule Take 25 mg by mouth every 6 (six) hours as needed.    . GuaiFENesin (TUSSIN PO) Take by mouth as needed.    . hydrochlorothiazide (HYDRODIURIL) 12.5 MG tablet Take 1 tablet (12.5 mg total) by mouth daily. 30 tablet 3  . Magnesium Hydroxide (MILK OF MAGNESIA PO) Take by mouth as needed.    . Multiple Vitamins-Minerals (CENTRUM SILVER PO) Take 1 tablet by mouth daily.     . ramipril (ALTACE) 10 MG capsule TAKE ONE CAPSULE BY MOUTH ONCE DAILY 90 capsule 3  . Simethicone (MYLANTA GAS PO) Take by mouth as needed.    . timolol (TIMOPTIC) 0.5 % ophthalmic solution Place 1 drop into both eyes daily.     Marland Kitchen  zolpidem (AMBIEN) 5 MG tablet TAKE ONE-HALF TO ONE TABLET BY MOUTH AT BEDTIME AS NEEDED FOR SLEEP *LIMIT  TO  ONE  TO  TWO  PER  WEEK* 30 tablet 0  . metoprolol succinate (TOPROL-XL) 50 MG 24 hr tablet Take 1 tablet (50 mg total) by mouth daily. Take with or immediately following a meal. 90 tablet 3   No current facility-administered medications for this visit.       Allergies:   Amoxil [amoxicillin]; Codeine; Pacerone  [amiodarone hcl]; Sulfa drugs cross reactors; and Amlodipine   Social History:  The patient  reports that she has never smoked. She has never used smokeless tobacco. She reports that she drinks alcohol. She reports that she does not use drugs.   Family History:   family history includes Heart attack in her father; Heart failure in her brother.    Review of Systems: Review of Systems  Constitutional: Negative.   Respiratory: Negative.   Cardiovascular: Positive for palpitations and leg swelling.  Gastrointestinal: Negative.   Musculoskeletal: Negative.        Gait instability  Neurological: Negative.   Psychiatric/Behavioral: The patient has insomnia.   All other systems reviewed and are negative.    PHYSICAL EXAM: VS:  BP (!) 168/98 (BP Location: Left Arm, Patient Position: Sitting, Cuff Size: Normal)   Pulse 77   Ht 5' 3.5" (1.613 m)   Wt 152 lb (68.9 kg)   BMI 26.50 kg/m  , BMI Body mass index is 26.5 kg/m.  GEN: Well nourished, well developed, in no acute distress  HEENT: normal  Neck: no JVD, carotid bruits, or masses Cardiac: RRR; periods of irregular rhythm, no murmurs, rubs, or gallops, trace pitting edema lower extremities bilaterally Respiratory:  clear to auscultation bilaterally, normal work of breathing GI: soft, nontender, nondistended, + BS MS: no deformity or atrophy  Skin: warm and dry, no rash Neuro:  Strength and sensation are intact Psych: euthymic mood, full affect    Recent Labs: No results found for requested labs within last 8760 hours.    Lipid Panel Lab Results  Component Value Date   CHOL 153 06/28/2015   HDL 60.20 06/28/2015   LDLCALC 75 06/28/2015   TRIG 90.0 06/28/2015      Wt Readings from Last 3 Encounters:  09/06/17 152 lb (68.9 kg)  07/10/17 150 lb (68 kg)  06/29/17 149 lb 4 oz (67.7 kg)       ASSESSMENT AND PLAN:  Paroxysmal supraventricular tachycardia  (HCC) - Plan: EKG 12-Lead Reports having tachycardia and palpitations in the evening Unable to exclude paroxysmal atrial fibrillation 2 week event monitor has been ordered Recommended that when she runs out of bystolic that we change her to metoprolol succinate 25 mg daily If she continues to have symptomatic palpitations we would increase up to 50 mg daily  Leg swelling Currently on compression hose, HCTZ Recommended leg elevation  PVC's (premature ventricular contractions) Event monitor placed   Essential hypertension Beta blocker changed as above, Will need to monitor pressures as a running high today  Chronic venous insufficiency Recommended compression hose, leg elevation  Episodic mood disorder (HCC) Adjustment disorder after loss of her husband Currently in assisted living    Total encounter time more than 25 minutes  Greater than 50% was spent in counseling and coordination of care with the patient   Disposition:   F/U  12 months as needed   Orders Placed This Encounter  Procedures  . LONG TERM MONITOR (3-14  DAYS)  . EKG 12-Lead     Signed, Esmond Plants, M.D., Ph.D. 09/06/2017  [place], [place]

## 2017-09-06 ENCOUNTER — Encounter: Payer: Self-pay | Admitting: Cardiovascular Disease

## 2017-09-06 ENCOUNTER — Ambulatory Visit (INDEPENDENT_AMBULATORY_CARE_PROVIDER_SITE_OTHER): Payer: Medicare Other | Admitting: Cardiovascular Disease

## 2017-09-06 ENCOUNTER — Ambulatory Visit (INDEPENDENT_AMBULATORY_CARE_PROVIDER_SITE_OTHER): Payer: Medicare Other

## 2017-09-06 VITALS — BP 168/98 | HR 77 | Ht 63.5 in | Wt 152.0 lb

## 2017-09-06 DIAGNOSIS — I493 Ventricular premature depolarization: Secondary | ICD-10-CM

## 2017-09-06 DIAGNOSIS — I471 Supraventricular tachycardia: Secondary | ICD-10-CM

## 2017-09-06 DIAGNOSIS — F39 Unspecified mood [affective] disorder: Secondary | ICD-10-CM

## 2017-09-06 DIAGNOSIS — I10 Essential (primary) hypertension: Secondary | ICD-10-CM

## 2017-09-06 MED ORDER — METOPROLOL SUCCINATE ER 50 MG PO TB24: 50.0000 mg | ORAL_TABLET | Freq: Every day | ORAL | 3 refills | Status: DC

## 2017-09-06 NOTE — Patient Instructions (Addendum)
Medication Instructions:   Please take bystolic whole pill once a day When you run out, Change to metoprolol succinate 1/2 pill once a  Day with supper/dinner  Labwork:  No new labs needed  Testing/Procedures:  We will place a 2 week monitor to look for atrial fibrillation, palpitations, SVT   Follow-Up: It was a pleasure seeing you in the office today. Please call us if you have new issues that need to be addressed before your next appt.  [phone]  Your physician wants you to follow-up in: 6 months.  You will receive a reminder letter in the mail two months in advance. If you don't receive a letter, please call our office to schedule the follow-up appointment.  If you need a refill on your cardiac medications before your next appointment, please call your pharmacy.

## 2017-09-07 ENCOUNTER — Telehealth: Payer: Self-pay | Admitting: Cardiovascular Disease

## 2017-09-07 NOTE — Telephone Encounter (Signed)
Pt has holter monitor, and has a dental appt tomorrow and ask if she should go to her dental appt due to monitor. Please call

## 2017-09-07 NOTE — Telephone Encounter (Signed)
Luellen Pucker with [facility] called and she just wanted to know if patient should reschedule her dentist visit due to [facility] monitor. Reviewed with her that should be fine and that she could make a note in the booklet that she had dental work at that time in case it should show any artifact during that visit. Advised her that she should be fine wearing the monitor during that dental visit and to please call us back if any further questions. She states that she will inform patient of this and had no further questions at this time.

## 2017-09-08 ENCOUNTER — Telehealth: Payer: Self-pay | Admitting: Cardiovascular Disease

## 2017-09-08 MED ORDER — METOPROLOL SUCCINATE ER 25 MG PO TB24: 25.0000 mg | ORAL_TABLET | Freq: Every day | ORAL | 6 refills | Status: DC

## 2017-09-08 NOTE — Telephone Encounter (Signed)
Please advise.   Per last office visit 09/06/2017   "Change to metoprolol succinate 1/2 pill once a  Day with supper/dinner"

## 2017-09-08 NOTE — Telephone Encounter (Signed)
Confirmed with pharmacist that patient should be taking metoprolol succinate 25 mg once daily with supper. She requested that I send in corrected prescription for lower dose for convenience. Changed in system and sent over to them. She had no further questions at this time.

## 2017-09-08 NOTE — Telephone Encounter (Signed)
Pharmacy calling stating they received RX on metoprolol  Electronically it says Take 1 tablet 50 mg total a day Written RX says 25 mg once a day  Need to get someone to call and clarify that   Please call back

## 2017-09-17 DIAGNOSIS — H40051 Ocular hypertension, right eye: Secondary | ICD-10-CM | POA: Diagnosis not present

## 2017-09-20 DIAGNOSIS — I493 Ventricular premature depolarization: Secondary | ICD-10-CM | POA: Diagnosis not present

## 2017-09-20 DIAGNOSIS — I471 Supraventricular tachycardia: Secondary | ICD-10-CM

## 2017-09-24 DIAGNOSIS — R002 Palpitations: Secondary | ICD-10-CM | POA: Diagnosis not present

## 2017-09-27 NOTE — Telephone Encounter (Signed)
Shelly Padilla from [facility] calling to discuss med issues   Patient recently changed from metoprolol and is feeling much worse not happy she wants to switch back to bystolic and wants to know what time of day she should be taking this   Please call Shelly Padilla to discuss

## 2017-09-27 NOTE — Telephone Encounter (Signed)
S/w Luellen Pucker at [facility]. Pt reports to Luellen Pucker that she has been having more episodes since bystolic was changed to metoprolol, however she can not explain what is meant by episodes. Monitor worn after medication change showed rare episodes of palpitations, longest being 13 seconds. She has been taking metoprolol 25mg  qd at 4:30pm (dinner) as instructed on prescription however, AVS states take 1/2 metoprolol succinate. Pt would like to switch back to bystolic however, it is cost prohibitive. Will route to MD regarding correct metoprolol dosage.

## 2017-09-28 MED ORDER — METOPROLOL SUCCINATE ER 50 MG PO TB24: 50.0000 mg | ORAL_TABLET | Freq: Every day | ORAL | 6 refills | Status: DC

## 2017-09-28 NOTE — Addendum Note (Signed)
Addended by: Valora Corporal on: 09/28/2017 09:09 AM   Modules accepted: Orders

## 2017-09-28 NOTE — Telephone Encounter (Signed)
Patient returning call she is [facility]

## 2017-09-28 NOTE — Telephone Encounter (Signed)
Spoke with Laurence Aly at [facility] and she reports that patient has continued to have palpitations in the evening and that she wanted to switch back to [employer]. Reviewed chart and recommendations with her and that Dr. Rockey Situ recommended to increase to Metoprolol Succinate 50 mg if symptoms persisted. Reviewed in detail results of monitor along with medication changes and she verbalized understanding with no further questions at this time. Medication change sent in to [facility] and Luellen Pucker RN reviewed change verbally. She had no further questions at this time.

## 2017-10-07 ENCOUNTER — Ambulatory Visit: Payer: Medicare Other | Admitting: Internal Medicine

## 2017-10-07 ENCOUNTER — Encounter: Payer: Self-pay | Admitting: Internal Medicine

## 2017-10-07 VITALS — BP 124/68 | HR 70 | Temp 97.6°F | Resp 16 | Wt 150.0 lb

## 2017-10-07 DIAGNOSIS — I471 Supraventricular tachycardia: Secondary | ICD-10-CM

## 2017-10-07 DIAGNOSIS — I10 Essential (primary) hypertension: Secondary | ICD-10-CM

## 2017-10-07 DIAGNOSIS — D473 Essential (hemorrhagic) thrombocythemia: Secondary | ICD-10-CM | POA: Diagnosis not present

## 2017-10-07 DIAGNOSIS — F39 Unspecified mood [affective] disorder: Secondary | ICD-10-CM

## 2017-10-07 DIAGNOSIS — D329 Benign neoplasm of meninges, unspecified: Secondary | ICD-10-CM

## 2017-10-07 DIAGNOSIS — D75839 Thrombocytosis, unspecified: Secondary | ICD-10-CM

## 2017-10-07 NOTE — Assessment & Plan Note (Signed)
No headaches or neuro symptoms Will just follow clinically

## 2017-10-07 NOTE — Assessment & Plan Note (Signed)
No evidence of thrombosis anywhere Doesn't seem to be a acute phase reactant Will just follow periodically

## 2017-10-07 NOTE — Assessment & Plan Note (Signed)
BP Readings from Last 3 Encounters:  10/07/17 124/68  09/06/17 (!) 168/98  07/17/17 140/72   BP good now. No change--if dizziness, would consider stopping the diuretic

## 2017-10-07 NOTE — Progress Notes (Signed)
Subjective:    Patient ID: Shelly Padilla, female    DOB: 11/03/1920, 83 y.o.   MRN: [id number]  HPI Visit at apartment at [facility] for follow up of chronic medical conditions Reviewed status with Luellen Pucker RN  Reviewed recent heart monitor from Dr Rockey Situ No atrial fibrillation No extended tachycardia No recent palpitations--she does note irregular beats if she takes her pulse No chest pain No SOB No dizziness or syncope Occasionally participates in exercise program here. Does try to walk  Has noticed that she is more forgetful Forgetting things that people told her She feels she has adjusted well Corliss Blacker of friends Appreciates the support of staff No depression. Doesn't get involved in many activities but not anhedonia  No headaches in many months No focal weakness  Current Outpatient Prescriptions on File Prior to Visit  Medication Sig Dispense Refill  . acetaminophen (TYLENOL) 325 MG tablet Take 650 mg by mouth every 6 (six) hours as needed.    Marland Kitchen aspirin 81 MG tablet Take 81 mg by mouth daily.    . Bismuth Subsalicylate (KAOPECTATE PO) Take by mouth as needed.    . carbamide peroxide (DEBROX) 6.5 % OTIC solution 5 drops as needed.    . diphenhydrAMINE (BENADRYL) 25 mg capsule Take 25 mg by mouth every 6 (six) hours as needed.    . GuaiFENesin (TUSSIN PO) Take by mouth as needed.    . hydrochlorothiazide (HYDRODIURIL) 12.5 MG tablet Take 1 tablet (12.5 mg total) by mouth daily. 30 tablet 3  . Magnesium Hydroxide (MILK OF MAGNESIA PO) Take by mouth as needed.    . metoprolol succinate (TOPROL-XL) 50 MG 24 hr tablet Take 1 tablet (50 mg total) by mouth daily with supper. Take with or immediately following a meal. 30 tablet 6  . Multiple Vitamins-Minerals (CENTRUM SILVER PO) Take 1 tablet by mouth daily.     . ramipril (ALTACE) 10 MG capsule TAKE ONE CAPSULE BY MOUTH ONCE DAILY 90 capsule 3  . Simethicone (MYLANTA GAS PO) Take by mouth as needed.    . timolol (TIMOPTIC) 0.5  % ophthalmic solution Place 1 drop into both eyes daily.     Marland Kitchen zolpidem (AMBIEN) 5 MG tablet TAKE ONE-HALF TO ONE TABLET BY MOUTH AT BEDTIME AS NEEDED FOR SLEEP *LIMIT  TO  ONE  TO  TWO  PER  WEEK* 30 tablet 0   No current facility-administered medications on file prior to visit.     Allergies  Allergen Reactions  . Amoxil [Amoxicillin] Anxiety  . Codeine   . Pacerone  [Amiodarone Hcl] Nausea And Vomiting  . Sulfa Drugs Cross Reactors   . Amlodipine Other (See Comments)    Mild edema    Past Medical History:  Diagnosis Date  . Atrial fibrillation ([place])   . Brain tumor ([place])    meningioma  . Glaucoma   . H/O paroxysmal supraventricular tachycardia    documented by Holter Monitor  . Hemorrhoids   . Hiatal hernia   . History of ovarian cyst   . HTN (hypertension)   . Insomnia   . Palpitations   . Thrombocytosis ([place])     Past Surgical History:  Procedure Laterality Date  . CATARACT EXTRACTION     left eye  . Leg vein stripping  1970  . TONSILLECTOMY  1929    Family History  Problem Relation Age of Onset  . Heart attack Father   . Heart failure Brother     Social History  Social History  . Marital status: Widowed    Spouse name: N/A  . Number of children: 1  . Years of education: N/A   Occupational History  . retired     Network engineer   Social History Main Topics  . Smoking status: Never Smoker  . Smokeless tobacco: Never Used  . Alcohol use 0.0 oz/week     Comment: occasionally wine  . Drug use: No  . Sexual activity: Not on file   Other Topics Concern  . Not on file   Social History Narrative   Primary care giver of husband with Alzheimer's--now in memory care      Has living will   Son is health care POA   Has DNR   No tube feeds if cognitively unaware   Review of Systems Eyesight seems to be fading some--- discussed getting reevaluation. May be related to playing a game on the computer more often Some urinary urgency. Wears pad but no  regular incontinence Doesn't really sleep well--but generally doesn't ask for the zolpidem Appetite is fine Weight is stable No blood clots, calf swelling, etc    Objective:   Physical Exam  Constitutional: She appears well-nourished. No distress.  Neck: No thyromegaly present.  Cardiovascular: Normal rate, regular rhythm and normal heart sounds.  Exam reveals no gallop.   No murmur heard. Frequent ectopy  Pulmonary/Chest: Effort normal and breath sounds normal. No respiratory distress. She has no wheezes. She has no rales.  Abdominal: She exhibits no distension. There is no tenderness. There is no rebound and no guarding.  Musculoskeletal: She exhibits no tenderness.  At most trace edema  Lymphadenopathy:    She has no cervical adenopathy.  Skin: No rash noted. No erythema.  Psychiatric: She has a normal mood and affect. Her behavior is normal.          Assessment & Plan:

## 2017-10-07 NOTE — Assessment & Plan Note (Signed)
No evidence of recurrence Remains on beta blocker

## 2017-10-07 NOTE — Assessment & Plan Note (Signed)
Has adjusted here well No medications indicated--other than occasional zolpidem to help sleep

## 2017-11-01 DIAGNOSIS — H903 Sensorineural hearing loss, bilateral: Secondary | ICD-10-CM | POA: Diagnosis not present

## 2017-11-01 DIAGNOSIS — H6123 Impacted cerumen, bilateral: Secondary | ICD-10-CM | POA: Diagnosis not present

## 2017-11-26 ENCOUNTER — Encounter: Payer: Self-pay | Admitting: Emergency Medicine

## 2017-11-26 ENCOUNTER — Emergency Department: Payer: Medicare Other

## 2017-11-26 ENCOUNTER — Observation Stay: Payer: Medicare Other

## 2017-11-26 ENCOUNTER — Other Ambulatory Visit: Payer: Self-pay

## 2017-11-26 ENCOUNTER — Observation Stay
Admission: EM | Admit: 2017-11-26 | Discharge: 2017-11-27 | Disposition: A | Discharge status: Nursing Home (Includes ICF) | Payer: Medicare Other | Attending: Specialist | Admitting: Specialist

## 2017-11-26 DIAGNOSIS — D696 Thrombocytopenia, unspecified: Secondary | ICD-10-CM | POA: Diagnosis not present

## 2017-11-26 DIAGNOSIS — Z888 Allergy status to other drugs, medicaments and biological substances status: Secondary | ICD-10-CM | POA: Diagnosis not present

## 2017-11-26 DIAGNOSIS — H409 Unspecified glaucoma: Secondary | ICD-10-CM | POA: Diagnosis not present

## 2017-11-26 DIAGNOSIS — G459 Transient cerebral ischemic attack, unspecified: Principal | ICD-10-CM | POA: Diagnosis present

## 2017-11-26 DIAGNOSIS — I4891 Unspecified atrial fibrillation: Secondary | ICD-10-CM | POA: Diagnosis not present

## 2017-11-26 DIAGNOSIS — F5101 Primary insomnia: Secondary | ICD-10-CM | POA: Insufficient documentation

## 2017-11-26 DIAGNOSIS — Z79899 Other long term (current) drug therapy: Secondary | ICD-10-CM | POA: Insufficient documentation

## 2017-11-26 DIAGNOSIS — Z7982 Long term (current) use of aspirin: Secondary | ICD-10-CM | POA: Insufficient documentation

## 2017-11-26 DIAGNOSIS — Z791 Long term (current) use of non-steroidal anti-inflammatories (NSAID): Secondary | ICD-10-CM | POA: Insufficient documentation

## 2017-11-26 DIAGNOSIS — R29700 NIHSS score 0: Secondary | ICD-10-CM | POA: Insufficient documentation

## 2017-11-26 DIAGNOSIS — Z881 Allergy status to other antibiotic agents status: Secondary | ICD-10-CM | POA: Diagnosis not present

## 2017-11-26 DIAGNOSIS — Z882 Allergy status to sulfonamides status: Secondary | ICD-10-CM | POA: Diagnosis not present

## 2017-11-26 DIAGNOSIS — R29818 Other symptoms and signs involving the nervous system: Secondary | ICD-10-CM | POA: Diagnosis not present

## 2017-11-26 DIAGNOSIS — I10 Essential (primary) hypertension: Secondary | ICD-10-CM | POA: Diagnosis not present

## 2017-11-26 DIAGNOSIS — I672 Cerebral atherosclerosis: Secondary | ICD-10-CM | POA: Diagnosis not present

## 2017-11-26 DIAGNOSIS — E871 Hypo-osmolality and hyponatremia: Secondary | ICD-10-CM | POA: Insufficient documentation

## 2017-11-26 DIAGNOSIS — Z885 Allergy status to narcotic agent status: Secondary | ICD-10-CM | POA: Diagnosis not present

## 2017-11-26 DIAGNOSIS — D320 Benign neoplasm of cerebral meninges: Secondary | ICD-10-CM | POA: Diagnosis not present

## 2017-11-26 DIAGNOSIS — R4781 Slurred speech: Secondary | ICD-10-CM

## 2017-11-26 LAB — PROTIME-INR
INR: 0.94
Prothrombin Time: 12.5 seconds (ref 11.4–15.2)

## 2017-11-26 LAB — GLUCOSE, CAPILLARY: Glucose-Capillary: 111 mg/dL — ABNORMAL HIGH (ref 65–99)

## 2017-11-26 LAB — COMPREHENSIVE METABOLIC PANEL
ALBUMIN: 3.9 g/dL (ref 3.5–5.0)
ALT: 13 U/L — AB (ref 14–54)
ANION GAP: 5 (ref 5–15)
AST: 19 U/L (ref 15–41)
Alkaline Phosphatase: 54 U/L (ref 38–126)
BUN: 19 mg/dL (ref 6–20)
CALCIUM: 8.8 mg/dL — AB (ref 8.9–10.3)
CO2: 31 mmol/L (ref 22–32)
CREATININE: 0.71 mg/dL (ref 0.44–1.00)
Chloride: 95 mmol/L — ABNORMAL LOW (ref 101–111)
GFR calc Af Amer: >60 mL/min (ref >60)
GFR calc non Af Amer: >60 mL/min (ref >60)
GLUCOSE: 118 mg/dL — AB (ref 65–99)
Potassium: 3.9 mmol/L (ref 3.5–5.1)
SODIUM: 131 mmol/L — AB (ref 135–145)
Total Bilirubin: 0.6 mg/dL (ref 0.3–1.2)
Total Protein: 6.6 g/dL (ref 6.5–8.1)

## 2017-11-26 LAB — TROPONIN I: Troponin I: <0.03 ng/mL (ref <0.03)

## 2017-11-26 LAB — DIFFERENTIAL
BASOS ABS: 0.1 10*3/uL (ref 0–0.1)
Basophils Relative: 1 %
Eosinophils Absolute: 0.1 10*3/uL (ref 0–0.7)
Eosinophils Relative: 1 %
LYMPHS PCT: 9 %
Lymphs Abs: 1 10*3/uL (ref 1.0–3.6)
MONOS PCT: 12 %
Monocytes Absolute: 1.2 10*3/uL — ABNORMAL HIGH (ref 0.2–0.9)
NEUTROS ABS: 8.1 10*3/uL — AB (ref 1.4–6.5)
NEUTROS PCT: 77 %

## 2017-11-26 LAB — CBC
HEMATOCRIT: 41.7 % (ref 35.0–47.0)
Hemoglobin: 14.5 g/dL (ref 12.0–16.0)
MCH: 30.5 pg (ref 26.0–34.0)
MCHC: 34.8 g/dL (ref 32.0–36.0)
MCV: 87.6 fL (ref 80.0–100.0)
Platelets: 665 10*3/uL — ABNORMAL HIGH (ref 150–440)
RBC: 4.76 MIL/uL (ref 3.80–5.20)
RDW: 13.6 % (ref 11.5–14.5)
WBC: 10.5 10*3/uL (ref 3.6–11.0)

## 2017-11-26 LAB — APTT: APTT: 36 s (ref 24–36)

## 2017-11-26 MED ORDER — ENOXAPARIN SODIUM 40 MG/0.4ML ~~LOC~~ SOLN: 40.0000 mg | SUBCUTANEOUS | Status: DC

## 2017-11-26 MED ORDER — ACETAMINOPHEN 650 MG RE SUPP: 650.0000 mg | RECTAL | Status: DC | PRN

## 2017-11-26 MED ORDER — STROKE: EARLY STAGES OF RECOVERY BOOK
Freq: Once | Status: AC
  Administered 2017-11-26: 21:00:00

## 2017-11-26 MED ORDER — ASPIRIN 325 MG PO TABS
325.0000 mg | ORAL_TABLET | Freq: Every day | ORAL | Status: DC
  Administered 2017-11-27: 325 mg via ORAL
  Filled 2017-11-26 (×3): qty 1

## 2017-11-26 MED ORDER — ASPIRIN 81 MG PO CHEW
324.0000 mg | CHEWABLE_TABLET | Freq: Once | ORAL | Status: AC
  Administered 2017-11-26: 324 mg via ORAL
  Filled 2017-11-26: qty 4

## 2017-11-26 MED ORDER — TIMOLOL MALEATE 0.5 % OP SOLN
1.0000 [drp] | Freq: Every day | OPHTHALMIC | Status: DC
  Administered 2017-11-27: 1 [drp] via OPHTHALMIC
  Filled 2017-11-26: qty 5

## 2017-11-26 MED ORDER — SENNOSIDES-DOCUSATE SODIUM 8.6-50 MG PO TABS: 1.0000 | ORAL_TABLET | Freq: Every evening | ORAL | Status: DC | PRN

## 2017-11-26 MED ORDER — SODIUM CHLORIDE 0.9 % IV SOLN
INTRAVENOUS | Status: DC
  Administered 2017-11-26: 21:00:00 via INTRAVENOUS

## 2017-11-26 MED ORDER — ACETAMINOPHEN 160 MG/5ML PO SOLN
650.0000 mg | ORAL | Status: DC | PRN
  Filled 2017-11-26: qty 20.3

## 2017-11-26 MED ORDER — DIPHENHYDRAMINE HCL 25 MG PO CAPS: 25.0000 mg | ORAL_CAPSULE | ORAL | Status: DC | PRN

## 2017-11-26 MED ORDER — GADOBENATE DIMEGLUMINE 529 MG/ML IV SOLN
15.0000 mL | Freq: Once | INTRAVENOUS | Status: AC | PRN
  Administered 2017-11-26: 14 mL via INTRAVENOUS

## 2017-11-26 MED ORDER — ASPIRIN 300 MG RE SUPP: 300.0000 mg | Freq: Every day | RECTAL | Status: DC

## 2017-11-26 MED ORDER — HYDROCHLOROTHIAZIDE 25 MG PO TABS
12.5000 mg | ORAL_TABLET | Freq: Every day | ORAL | Status: DC
  Administered 2017-11-27: 09:00:00 12.5 mg via ORAL
  Filled 2017-11-26: qty 1

## 2017-11-26 MED ORDER — ZOLPIDEM TARTRATE 5 MG PO TABS
5.0000 mg | ORAL_TABLET | Freq: Every evening | ORAL | Status: DC | PRN
  Administered 2017-11-27: 5 mg via ORAL
  Filled 2017-11-26: qty 1

## 2017-11-26 MED ORDER — ACETAMINOPHEN 325 MG PO TABS: 650.0000 mg | ORAL_TABLET | ORAL | Status: DC | PRN

## 2017-11-26 MED ORDER — RAMIPRIL 10 MG PO CAPS
10.0000 mg | ORAL_CAPSULE | Freq: Every day | ORAL | Status: DC
  Administered 2017-11-26 – 2017-11-27 (×2): 10 mg via ORAL
  Filled 2017-11-26 (×2): qty 1

## 2017-11-26 MED ORDER — DEXTROMETHORPHAN-GUAIFENESIN 10-100 MG/5ML PO LIQD
10.0000 mL | ORAL | Status: DC | PRN
  Filled 2017-11-26: qty 10

## 2017-11-26 MED ORDER — METOPROLOL SUCCINATE ER 50 MG PO TB24: 50.0000 mg | ORAL_TABLET | Freq: Every day | ORAL | Status: DC

## 2017-11-26 NOTE — Progress Notes (Signed)
[place] [facility] responded to a PG for Code Stroke in ED-25. Pt was being assessed on my arrival. Pt's friend is bedside. Friend stated that Pt is having slurred speech. She is a member of the Pt's care facility. Pt has a son that lives in [place] who relies on the facility for his mother's care. Pt stated that her symptoms are diminishing. EDP suggested the Pt be admitted overnight for evaluation. Pt agreed. Pt asked for prayer, which was provided. Pierz will follow up as needed.    11/26/17 1700  Clinical Encounter Type  Visited With Patient;Other (Comment) (Friend)  Visit Type Initial;Code;ED (Code Stroke)  Referral From Nurse  Consult/Referral To Chaplain  Spiritual Encounters  Spiritual Needs Prayer;Emotional

## 2017-11-26 NOTE — H&P (Signed)
[facility] [place] NAME: Shelly Padilla    MR#:  [id number]  DATE OF BIRTH:  12/10/1920  DATE OF ADMISSION:  11/26/2017  PRIMARY CARE PHYSICIAN: Venia Carbon, MD   REQUESTING/REFERRING PHYSICIAN:Norman, Anne-Caroline, MD  CHIEF COMPLAINT:   Chief Complaint  Patient presents with  . Aphasia   Slurred speech this afternoon. HISTORY OF PRESENT ILLNESS:  Shelly Padilla  is a 83 y.o. female with a known history of hypertension, A. fib with and brain tumor, thrombocytosis.  The patient presents the ED with above chief complaints.  She developed slurred speech this afternoon, which resolved in a short time.  She denies any other symptoms.  Per Dr. Mariea Clonts, telemetry neurologist suggested TIA and need stroke workup.  PAST MEDICAL HISTORY:   Past Medical History:  Diagnosis Date  . Atrial fibrillation ([place])   . Brain tumor ([place])    meningioma  . Glaucoma   . H/O paroxysmal supraventricular tachycardia    documented by Holter Monitor  . Hemorrhoids   . Hiatal hernia   . History of ovarian cyst   . HTN (hypertension)   . Insomnia   . Palpitations   . Thrombocytosis ([place])     PAST SURGICAL HISTORY:   Past Surgical History:  Procedure Laterality Date  . CATARACT EXTRACTION     left eye  . Leg vein stripping  1970  . TONSILLECTOMY  1929    SOCIAL HISTORY:   Social History   Tobacco Use  . Smoking status: Never Smoker  . Smokeless tobacco: Never Used  Substance Use Topics  . Alcohol use: Yes    Alcohol/week: 0.0 oz    Comment: occasionally wine    FAMILY HISTORY:   Family History  Problem Relation Age of Onset  . Heart attack Father   . Heart failure Brother     DRUG ALLERGIES:   Allergies  Allergen Reactions  . Amoxil [Amoxicillin] Anxiety  . Codeine   . Pacerone  [Amiodarone Hcl] Nausea And Vomiting  . Sulfa Drugs Cross Reactors   . Amlodipine Other (See Comments)    Mild edema    REVIEW OF  SYSTEMS:   Review of Systems  Constitutional: Negative for chills, fever and malaise/fatigue.  HENT: Positive for hearing loss. Negative for sore throat.   Eyes: Negative for blurred vision and double vision.  Respiratory: Negative for cough, hemoptysis, shortness of breath, wheezing and stridor.   Cardiovascular: Negative for chest pain, palpitations, orthopnea and leg swelling.  Gastrointestinal: Negative for abdominal pain, blood in stool, diarrhea, melena, nausea and vomiting.  Genitourinary: Negative for dysuria, flank pain and hematuria.  Musculoskeletal: Negative for back pain and joint pain.  Skin: Negative for rash.  Neurological: Positive for speech change. Negative for dizziness, sensory change, focal weakness, seizures, loss of consciousness, weakness and headaches.       Memory loss.  Endo/Heme/Allergies: Negative for polydipsia.  Psychiatric/Behavioral: Negative for depression. The patient is not nervous/anxious.     MEDICATIONS AT HOME:   Prior to Admission medications   Medication Sig Start Date End Date Taking? Authorizing Provider  acetaminophen (TYLENOL) 325 MG tablet Take 325-650 mg by mouth every 4 (four) hours as needed for mild pain or fever.    Yes [provider]  aspirin 81 MG tablet Take 81 mg by mouth daily.   Yes [provider]  bismuth subsalicylate (KAOPECTATE) 262 MG/15ML suspension Take 10 mLs by mouth 3 (three) times daily as  needed.    Yes [provider]  carbamide peroxide (DEBROX) 6.5 % OTIC solution 5 drops 5 (five) times daily.    Yes [provider]  dextromethorphan-guaiFENesin (ROBITUSSIN-DM) 10-100 MG/5ML liquid Take 10 mLs by mouth every 4 (four) hours as needed for cough.   Yes [provider]  diphenhydrAMINE (BENADRYL) 25 mg capsule Take 25 mg by mouth every 4 (four) hours as needed.    Yes [provider]  hydrochlorothiazide (HYDRODIURIL) 12.5 MG tablet Take 1 tablet (12.5 mg total) by  mouth daily. 03/31/17  Yes Venia Carbon, MD  Magnesium Hydroxide (MILK OF MAGNESIA PO) Take 30 mLs by mouth daily as needed.    Yes [provider]  metoprolol succinate (TOPROL-XL) 50 MG 24 hr tablet Take 1 tablet (50 mg total) by mouth daily with supper. Take with or immediately following a meal. 09/28/17 12/27/17 Yes Gollan, Kathlene November, MD  Multiple Vitamins-Minerals (CENTRUM SILVER PO) Take 1 tablet by mouth daily.    Yes [provider]  nystatin (MYCOSTATIN/NYSTOP) powder Apply 1 Bottle topically 2 (two) times daily.   Yes [provider]  ramipril (ALTACE) 10 MG capsule TAKE ONE CAPSULE BY MOUTH ONCE DAILY 10/07/16  Yes Gollan, Kathlene November, MD  Simethicone (MYLANTA GAS PO) Take 30 mLs by mouth every 4 (four) hours as needed.    Yes [provider]  timolol (TIMOPTIC) 0.5 % ophthalmic solution Place 1 drop into both eyes daily.  04/29/15  Yes [provider]  zolpidem (AMBIEN) 5 MG tablet TAKE ONE-HALF TO ONE TABLET BY MOUTH AT BEDTIME AS NEEDED FOR SLEEP *LIMIT  TO  ONE  TO  TWO  PER  WEEK* 10/07/16  Yes Venia Carbon, MD      VITAL SIGNS:  Blood pressure (!) 155/80, pulse 68, temperature 97.6 F (36.4 C), temperature source Oral, resp. rate (!) 21, SpO2 96 %.  PHYSICAL EXAMINATION:  Physical Exam  GENERAL:  83 y.o.-year-old patient lying in the bed with no acute distress.  EYES: Pupils equal, round, reactive to light and accommodation. No scleral icterus. Extraocular muscles intact.  HEENT: Head atraumatic, normocephalic. Oropharynx and nasopharynx clear.  NECK:  Supple, no jugular venous distention. No thyroid enlargement, no tenderness.  LUNGS: Normal breath sounds bilaterally, no wheezing, rales,rhonchi or crepitation. No use of accessory muscles of respiration.  CARDIOVASCULAR: S1, S2 normal. No murmurs, rubs, or gallops.  ABDOMEN: Soft, nontender, nondistended. Bowel sounds present. No organomegaly or mass.  EXTREMITIES: No pedal  edema, cyanosis, or clubbing.  NEUROLOGIC: Cranial nerves II through XII are intact. Muscle strength 5/5 in all extremities. Sensation intact. Gait not checked.  PSYCHIATRIC: The patient is alert and oriented x 3.  SKIN: No obvious rash, lesion, or ulcer.   LABORATORY PANEL:   CBC Recent Labs  Lab 11/26/17 1633  WBC 10.5  HGB 14.5  HCT 41.7  PLT 665*   ------------------------------------------------------------------------------------------------------------------  Chemistries  No results for input(s): NA, K, CL, CO2, GLUCOSE, BUN, CREATININE, CALCIUM, MG, AST, ALT, ALKPHOS, BILITOT in the last 168 hours.  Invalid input(s): GFRCGP ------------------------------------------------------------------------------------------------------------------  Cardiac Enzymes No results for input(s): TROPONINI in the last 168 hours. ------------------------------------------------------------------------------------------------------------------  RADIOLOGY:  Ct Head Code Stroke Wo Contrast  Addendum Date: 11/26/2017   ADDENDUM REPORT: 11/26/2017 17:02 ADDENDUM: Study discussed by telephone with Dr. Eula Listen on 11/26/2017 at 1659 hours. Electronically Signed   By: Genevie Ann M.D.   On: 11/26/2017 17:02   Result Date: 11/26/2017 CLINICAL DATA:  Code stroke.  83 year old female with slurred speech upon waking from nap at 1530 hours. Last seen well at lunch. EXAM: CT HEAD WITHOUT CONTRAST TECHNIQUE: Contiguous axial images were obtained from the base of the skull through the vertex without intravenous contrast. COMPARISON:  07/10/2017 head CT and earlier. FINDINGS: Brain: Chronic right occipital convexity 2.5 cm meningioma (series 2, image 14) appears minimally enlarged since the MRI on 08/03/2014. Stable regional mass effect. Mild associated right occipital lobe edema appears chronic and stable. No other intracranial mass or mass effect. Stable cerebral volume. Patchy and confluent bilateral  cerebral white matter hypodensity appears stable. No cortically based acute infarct identified. No acute intracranial hemorrhage identified. No ventriculomegaly. Vascular: Calcified atherosclerosis at the skull base. No suspicious intracranial vascular hyperdensity. Skull: No acute osseous abnormality identified. Sinuses/Orbits: Visualized paranasal sinuses and mastoids are stable and well pneumatized. Other: No acute orbit or scalp soft tissue findings. ASPECTS [facility] Stroke Program Early CT Score) - Ganglionic level infarction (caudate, lentiform nuclei, internal capsule, insula, M1-M3 cortex): 7 - Supraganglionic infarction (M4-M6 cortex): 3 Total score (0-10 with 10 being normal): 10 IMPRESSION: 1. No acute intracranial hemorrhage or acute cortically based infarct identified. ASPECTS is 10. 2. Chronic right occipital meningioma appears minimally increased since 2015. Mild chronic associated right occipital lobe edema. 3. Mild for age chronic white matter changes elsewhere, probably due to small vessel disease, appear stable since July. Electronically Signed: By: Genevie Ann M.D. On: 11/26/2017 16:56      IMPRESSION AND PLAN:   TIA The patient will be placed for observation.  Aspirin 325 mg p.o. daily, check lipid panel, MRI/MRA of the brain, echocardiogram and carotid duplex.  Neuro check.  Hyponatremia.  Start normal saline IV in the follow-up BMP.  History of A. fib.  Unknown type.  Continue aspirin.  Hypertension.  Continue home hypertension medication.  Thrombocytopenia.  Chronic and stable.  All the records are reviewed and case discussed with ED provider. Management plans discussed with the patient, her friend and they are in agreement.  CODE STATUS: Full code  TOTAL TIME TAKING CARE OF THIS PATIENT: 52 minutes.    Demetrios Loll M.D on 11/26/2017 at 5:51 PM  Between 7am to 6pm - Pager - [phone]  After 6pm go to [email or web address] - Proofreader  [facility] [place]  [facility]  Office  [phone]  CC: Primary care physician; Venia Carbon, MD   Note: This dictation was prepared with Dragon dictation along with smaller phrase technology. Any transcriptional errors that result from this process are unin

## 2017-11-26 NOTE — Progress Notes (Signed)
CH made a follow up visit. Pt is resting. Pt is questioning what purpose she serves as a 83 year old woman. She feels her age makes her a burden on others. CH suggested looking at life like a game of cards. The hand of cards she once played with is gone. but now she has a new hand. Langston asked how she will play the game with the new hand. Pt appreciated the analogy and stated she will "study" the new hand and make her next move.     11/26/17 2100  Clinical Encounter Type  Visited With Patient  Visit Type Follow-up  Consult/Referral To Chaplain

## 2017-11-26 NOTE — ED Triage Notes (Addendum)
First Nurse Note:  Arrives with C/O slurred speech onset today after waking up from nap at 1530, patient had been at baseline for breakfast and lunch.  Patient has history of falling last Friday and hitting head.  Patient, according to SNF, has been at baseline AAOx3 since fall, with only complaint of right posterior neck and head pain.    Patient arrives to ED.  AAOx3.  Speech clear.  Left facial droop noted.

## 2017-11-26 NOTE — Consult Note (Signed)
[facility] Tele[facility]urology Consult Services   Asked to see this patient in telemedicine consultation. Consultation was performed with assistance of ancillary / medical staff at bedside. ?   Verbal consent to perform the examination with telemedicine was obtained. Patient and family agreed to proceed with the consultation.   Impression: TIA v small stroke  Transient dysarthria and L face droop,, resolved  NIHSS 0 currently  CT with no acute process, stable R occipital meningioma  Not a tpa candidate due to:  NIHSS 0  Not an NIR candidate due to:  NIHSS 0  Differential Diagnosis:   Stroke  TIA  Seizure  Migraine   Metrics:  Arrival time:  1619  [facility] contacted:  0932  [facility] at bedside:  1703  NIHSS assessment time:  1705  Recommendations:    Antiplatelet therapy with ASA OK  DVT Prophylaxis  Dysphagia Screen  Inpatient diagnostic testing to consider includes:  MRI brain, additional testing at discretion of inpatient neurology consultation  Discussed with ED MD     CC:  Stroke Alert  Date of Consult:  11/26/17    HPI:  ALF resident  Took a nap at 1430 and was norma  Woke up at about 1530 and staff concerned about slurred speech and L face droop  Denies any history of stroke  There is a reported A Fib history, not on A/C (taking ASA)  To ER for further evaluation  On arrival to ER patient feels as if symptoms have resolved  Arrived at 1619  telestroke consult received at 1658  eval at 1705  NIHSS 0, no tPA or intervention indicated   Past Medical History:  Diagnosis Date  . Atrial fibrillation ([place])   . Brain tumor ([place])    meningioma  . Glaucoma   . H/O paroxysmal supraventricular tachycardia    documented by Holter Monitor  . Hemorrhoids   . Hiatal hernia   . History of ovarian cyst   . HTN (hypertension)   . Insomnia   . Palpitations   . Thrombocytosis ([place])     Prior to Admission  medications   Medication Sig Start Date End Date Taking? Authorizing Provider  acetaminophen (TYLENOL) 325 MG tablet Take 650 mg by mouth every 6 (six) hours as needed.    [provider]  aspirin 81 MG tablet Take 81 mg by mouth daily.    [provider]  Bismuth Subsalicylate (KAOPECTATE PO) Take by mouth as needed.    [provider]  carbamide peroxide (DEBROX) 6.5 % OTIC solution 5 drops as needed.    [provider]  diphenhydrAMINE (BENADRYL) 25 mg capsule Take 25 mg by mouth every 6 (six) hours as needed.    [provider]  GuaiFENesin (TUSSIN PO) Take by mouth as needed.    [provider]  hydrochlorothiazide (HYDRODIURIL) 12.5 MG tablet Take 1 tablet (12.5 mg total) by mouth daily. 03/31/17   Venia Carbon, MD  Magnesium Hydroxide (MILK OF MAGNESIA PO) Take by mouth as needed.    [provider]  metoprolol succinate (TOPROL-XL) 50 MG 24 hr tablet Take 1 tablet (50 mg total) by mouth daily with supper. Take with or immediately following a meal. 09/28/17 12/27/17  Minna Merritts, MD  Multiple Vitamins-Minerals (CENTRUM SILVER PO) Take 1 tablet by mouth daily.     [provider]  ramipril (ALTACE) 10 MG capsule TAKE ONE CAPSULE BY MOUTH ONCE DAILY 10/07/16   Minna Merritts, MD  Simethicone (  MYLANTA GAS PO) Take by mouth as needed.    [provider]  timolol (TIMOPTIC) 0.5 % ophthalmic solution Place 1 drop into both eyes daily.  04/29/15   [provider]  zolpidem (AMBIEN) 5 MG tablet TAKE ONE-HALF TO ONE TABLET BY MOUTH AT BEDTIME AS NEEDED FOR SLEEP *LIMIT  TO  ONE  TO  TWO  PER  WEEK* 10/07/16   Venia Carbon, MD     Allergies  Allergen Reactions  . Amoxil [Amoxicillin] Anxiety  . Codeine   . Pacerone  [Amiodarone Hcl] Nausea And Vomiting  . Sulfa Drugs Cross Reactors   . Amlodipine Other (See Comments)    Mild edema    Social History   Tobacco Use  . Smoking status:  Never Smoker  . Smokeless tobacco: Never Used  Substance Use Topics  . Alcohol use: Yes    Alcohol/week: 0.0 oz    Comment: occasionally wine  . Drug use: No    Family History  Problem Relation Age of Onset  . Heart attack Father   . Heart failure Brother         Physical Exam  Vitals:   11/26/17 1700  BP: (!) 161/65  Pulse: 70  Resp: 16  Temp: 97.6 F (36.4 C)  SpO2: 98%     NIHSS  LOC - 0  LOC questions - 0  LOC commands - 0  EOM - 0  VF - 0  Face - 0  Motor Upper Ext - 0  Motor Lower Ext - 0  Coordination - 0  Sensory - 0  Language - 0  Speech - 0  Extinction - 0  NIHSS total - 0   Lab/Data Review  CT Head - reviewed - no acute process, stable R occipital meningioma      Medical Decision Making:  - Extensive number of diagnosis or management options are considered above.   - Extensive amount of complex data reviewed.   - High risk of complication and/or morbidity or mortality are associated with differential diagnostic considerations above.  - There may be Uncertain outcome and increased probability of prolonged functional impairment or high probability of severe prolonged functional impairment associated with some of these differential diagnosis.  Medical Data Reviewed:  1.Data reviewed include clinical labs, radiology,  Medical Tests;   2.Tests results discussed w/performing or interpreting physician;   3.Obtaining/reviewing old medical records;  4.Obtaining case history from another source;  5.Independent review of image, tracing or specimen.

## 2017-11-26 NOTE — ED Provider Notes (Signed)
[facility] Emergency Department Provider Note  ____________________________________________  Time seen: Approximately 4:59 PM  I have reviewed the triage vital signs and the nursing notes.   HISTORY  Chief Complaint Aphasia    HPI Shelly Padilla is a 83 y.o. female with a known meningioma, A. fib on aspirin only presenting for slurred speech.  The patient reports that she laid down for nap at 2:30 PM, and when she woke up at 3:30 PM, she had slurred speech "like my tongue was not working."  She has no other complaints, including numbness tingling or weakness, difficulty walking, changes in vision or mental status.  At this time, the patient's symptoms have completely resolved.  The patient has no dysuria, infectious symptoms, or recent changes in her medications.  She did sustain a fall striking her forehead without any loss of consciousness on 12/0 7, and sought medical attention at her nursing facility but did not require higher level of care.  Past Medical History:  Diagnosis Date  . Atrial fibrillation ([place])   . Brain tumor ([place])    meningioma  . Glaucoma   . H/O paroxysmal supraventricular tachycardia    documented by Holter Monitor  . Hemorrhoids   . Hiatal hernia   . History of ovarian cyst   . HTN (hypertension)   . Insomnia   . Palpitations   . Thrombocytosis [facility])     Patient Active Problem List   Diagnosis Date Noted  . Chronic venous insufficiency 06/09/2016  . Right inguinal hernia 06/09/2016  . DD (diverticular disease) 04/10/2016  . Glaucoma 04/10/2016  . Hemorrhoid 04/10/2016  . Bilateral ovarian cysts 03/18/2016  . Thrombocytosis ([place])   . Preventative health care 06/28/2015  . Varicose veins 01/31/2015  . Advance directive discussed with patient 12/26/2014  . Meningioma ([place]) 10/01/2014  . Insomnia 05/18/2014  . Episodic mood disorder ([place]) 10/18/2012  . Paroxysmal supraventricular tachycardia ([place]) 03/08/2012  . HTN  (hypertension) 03/08/2012  . PVC's (premature ventricular contractions) 03/08/2012    Past Surgical History:  Procedure Laterality Date  . CATARACT EXTRACTION     left eye  . Leg vein stripping  1970  . TONSILLECTOMY  1929    Current Outpatient Rx  . Order #: [id number] Class: Historical Med  . Order #: [id number] Class: Historical Med  . Order #: [id number] Class: Historical Med  . Order #: [id number] Class: Historical Med  . Order #: [id number] Class: Historical Med  . Order #: [id number] Class: Historical Med  . Order #: [id number] Class: Normal  . Order #: [id number] Class: Historical Med  . Order #: [id number] Class: Normal  . Order #: [id number] Class: Historical Med  . Order #: [id number] Class: Historical Med  . Order #: [id number] Class: Normal  . Order #: [id number] Class: Historical Med  . Order #: [id number] Class: Historical Med  . Order #: [id number] Class: Phone In    Allergies Amoxil [amoxicillin]; Codeine; Pacerone  [amiodarone hcl]; Sulfa drugs cross reactors; and Amlodipine  Family History  Problem Relation Age of Onset  . Heart attack Father   . Heart failure Brother     Social History Social History   Tobacco Use  . Smoking status: Never Smoker  . Smokeless tobacco: Never Used  Substance Use Topics  . Alcohol use: Yes    Alcohol/week: 0.0 oz    Comment: occasionally wine  . Drug use: No    Review of Systems Constitutional: No fever/chills.  No lightheadedness or syncope.  No dizziness.  No general malaise. Eyes: No visual changes.  No blurred or double  vision. ENT: No sore throat. No congestion or rhinorrhea. Cardiovascular: Denies chest pain. Denies palpitations. Respiratory: Denies shortness of breath.  No cough. Gastrointestinal: No abdominal pain.  No nausea, no vomiting.  No diarrhea.  No constipation. Genitourinary: Negative for dysuria.  Frequency.  Musculoskeletal: Negative for back pain. Skin: Negative for rash. Neurological: Positive for mild frontal  headache which the patient has had since she sustained a fall on 12/0 7. No focal numbness, tingling or weakness.  No blurred or double vision.  No changes in mental status.  Positive slurred speech.    ____________________________________________   PHYSICAL EXAM:  VITAL SIGNS: ED Triage Vitals  Enc Vitals Group     BP      Pulse      Resp      Temp      Temp src      SpO2      Weight      Height      Head Circumference      Peak Flow      Pain Score      Pain Loc      Pain Edu?      Excl. in [place]?     Constitutional: Alert and oriented x 4. Well appearing and in no acute distress. Answers questions appropriately.  GCS is 15. Eyes: Conjunctivae are normal.  EOMI. No scleral icterus. Head: Atraumatic. Nose: No congestion/rhinnorhea. Mouth/Throat: Mucous membranes are moist.  Neck: No stridor.  Supple.  No JVD.  No meningismus. Cardiovascular: Normal rate, regular rhythm. No murmurs, rubs or gallops.  Respiratory: Normal respiratory effort.  No accessory muscle use or retractions. Lungs CTAB.  No wheezes, rales or ronchi. Gastrointestinal: Soft, nontender and nondistended.  No guarding or rebound.  No peritoneal signs. Musculoskeletal: No LE edema. No ttp in the calves or palpable cords.  Negative Homan's sign. Neurologic: Alert and oriented 3. Speech is clear. Naming and repetition are intact. Face and smile symmetric. Tongue is midline. No pronator drift. 5 out of 5 grip, biceps, triceps, hip flexors, plantar flexion and dorsiflexion. Normal sensation to light touch in the bilateral upper and lower extremities, and face. Normal heel-to-shin. Skin:  Skin is warm, dry and intact. No rash noted. Psychiatric: Mood and affect are normal. Speech and behavior are normal.  Normal judgement. ____________________________________________   LABS (all labs ordered are listed, but only abnormal results are displayed)  Labs Reviewed  CBC - Abnormal; Notable for the following  components:      Result Value   Platelets 665 (*)    All other components within normal limits  DIFFERENTIAL - Abnormal; Notable for the following components:   Neutro Abs 8.1 (*)    Monocytes Absolute 1.2 (*)    All other components within normal limits  GLUCOSE, CAPILLARY - Abnormal; Notable for the following components:   Glucose-Capillary 111 (*)    All other components within normal limits  COMPREHENSIVE METABOLIC PANEL  PROTIME-INR  APTT  TROPONIN I  CBG MONITORING, ED   ____________________________________________  EKG  ED ECG REPORT I, Eula Listen, the attending physician, personally viewed and interpreted this ECG.   Date: 11/26/2017  EKG Time: 1655  Rate: 77  Rhythm: normal sinus rhythm  Axis: leftward  Intervals:first degree block   ST&T Change: PAC's, no STEMI  ____________________________________________  RADIOLOGY  Ct Head Code Stroke Wo Contrast  Addendum Date: 11/26/2017   ADDENDUM REPORT: 11/26/2017 17:02 ADDENDUM: Study discussed by telephone with Dr. Eula Listen on 11/26/2017 at  1659 hours. Electronically Signed   By: Genevie Ann M.D.   On: 11/26/2017 17:02   Result Date: 11/26/2017 CLINICAL DATA:  Code stroke. 83 year old female with slurred speech upon waking from nap at 1530 hours. Last seen well at lunch. EXAM: CT HEAD WITHOUT CONTRAST TECHNIQUE: Contiguous axial images were obtained from the base of the skull through the vertex without intravenous contrast. COMPARISON:  07/10/2017 head CT and earlier. FINDINGS: Brain: Chronic right occipital convexity 2.5 cm meningioma (series 2, image 14) appears minimally enlarged since the MRI on 08/03/2014. Stable regional mass effect. Mild associated right occipital lobe edema appears chronic and stable. No other intracranial mass or mass effect. Stable cerebral volume. Patchy and confluent bilateral cerebral white matter hypodensity appears stable. No cortically based acute infarct identified. No  acute intracranial hemorrhage identified. No ventriculomegaly. Vascular: Calcified atherosclerosis at the skull base. No suspicious intracranial vascular hyperdensity. Skull: No acute osseous abnormality identified. Sinuses/Orbits: Visualized paranasal sinuses and mastoids are stable and well pneumatized. Other: No acute orbit or scalp soft tissue findings. ASPECTS [facility] Stroke Program Early CT Score) - Ganglionic level infarction (caudate, lentiform nuclei, internal capsule, insula, M1-M3 cortex): 7 - Supraganglionic infarction (M4-M6 cortex): 3 Total score (0-10 with 10 being normal): 10 IMPRESSION: 1. No acute intracranial hemorrhage or acute cortically based infarct identified. ASPECTS is 10. 2. Chronic right occipital meningioma appears minimally increased since 2015. Mild chronic associated right occipital lobe edema. 3. Mild for age chronic white matter changes elsewhere, probably due to small vessel disease, appear stable since July. Electronically Signed: By: Genevie Ann M.D. On: 11/26/2017 16:56    ____________________________________________   PROCEDURES  Procedure(s) performed: None  Procedures  Critical Care performed: No ____________________________________________   INITIAL IMPRESSION / ASSESSMENT AND PLAN / ED COURSE  Pertinent labs & imaging results that were available during my care of the patient were reviewed by me and considered in my medical decision making (see chart for details).  83 y.o. female with a history of A. fib presenting for a brief period of slurred speech, now completely resolved.  Overall, the patient is mildly hypertensive but otherwise has stable vital signs.  She has no focal neurologic deficits on examination.  It is possible that she had a TIA, but there are other possible causes including electrolyte abnormality, UTI, or pharmacologic causes for the patient's symptoms.  I am also concerned she might have a head bleed given her recent fall, although this  is less likely given the resolution of the patient's symptoms.  I had a long discussion with the patient about the fact that she is not a candidate for TPA today.  A tele-neurology consult has also been initiated.  ----------------------------------------- 5:23 PM on 11/26/2017 -----------------------------------------  The patient CT scan does not show any acute stroke.  She has a meningioma which is grossly unchanged, with some mild edema on the orbital aspect, which would not cause slurred speech but rather visual changes or headache, which the patient denies today.  The tele-neurologist has also had a chance to see and examine the patient, and we both agree that her symptoms do warrant admission for complete stroke evaluation.  I have ordered the MRI.  I will plan to admit the patient to the hospitalist at this time. ____________________________________________  FINAL CLINICAL IMPRESSION(S) / ED DIAGNOSES  Final diagnoses:  Slurred speech         NEW MEDICATIONS STARTED DURING THIS VISIT:  This SmartLink is deprecated. Use AVSMEDLIST instead to display the  medication list for a patient.    Eula Listen, MD 11/26/17 [phone]

## 2017-11-27 ENCOUNTER — Observation Stay: Payer: Medicare Other

## 2017-11-27 ENCOUNTER — Observation Stay (HOSPITAL_BASED_OUTPATIENT_CLINIC_OR_DEPARTMENT_OTHER)
Admit: 2017-11-27 | Discharge: 2017-11-27 | Disposition: A | Discharge status: Home / Self Care | Payer: Medicare Other | Attending: Internal Medicine | Admitting: Internal Medicine

## 2017-11-27 DIAGNOSIS — I471 Supraventricular tachycardia: Secondary | ICD-10-CM

## 2017-11-27 DIAGNOSIS — I6523 Occlusion and stenosis of bilateral carotid arteries: Secondary | ICD-10-CM | POA: Diagnosis not present

## 2017-11-27 DIAGNOSIS — I10 Essential (primary) hypertension: Secondary | ICD-10-CM

## 2017-11-27 DIAGNOSIS — G459 Transient cerebral ischemic attack, unspecified: Secondary | ICD-10-CM | POA: Diagnosis not present

## 2017-11-27 DIAGNOSIS — E871 Hypo-osmolality and hyponatremia: Secondary | ICD-10-CM | POA: Diagnosis not present

## 2017-11-27 LAB — LIPID PANEL
Cholesterol: 138 mg/dL (ref 0–200)
HDL: 48 mg/dL (ref >40)
LDL CALC: 77 mg/dL (ref 0–99)
Total CHOL/HDL Ratio: 2.9 RATIO
Triglycerides: 67 mg/dL (ref <150)
VLDL: 13 mg/dL (ref 0–40)

## 2017-11-27 LAB — GLUCOSE, CAPILLARY: GLUCOSE-CAPILLARY: 123 mg/dL — AB (ref 65–99)

## 2017-11-27 LAB — MRSA PCR SCREENING: MRSA BY PCR: NEGATIVE

## 2017-11-27 NOTE — Progress Notes (Signed)
Transported in transport chair to private vehicle accompanied by her friend.Discharge to [facility] to self care with assisted living accompanied by her friend.

## 2017-11-27 NOTE — Discharge Summary (Signed)
[facility] NAME: Shelly Padilla    MR#:  [id number]  DATE OF BIRTH:  08-05-1920  DATE OF ADMISSION:  11/26/2017 ADMITTING PHYSICIAN: Demetrios Loll, MD  DATE OF DISCHARGE: 11/27/2017  PRIMARY CARE PHYSICIAN: Venia Carbon, MD    ADMISSION DIAGNOSIS:  Slurred speech [R47.81] TIA (transient ischemic attack) [G45.9]  DISCHARGE DIAGNOSIS:  Active Problems:   TIA (transient ischemic attack)   SECONDARY DIAGNOSIS:   Past Medical History:  Diagnosis Date  . Atrial fibrillation ([place])   . Brain tumor ([place])    meningioma  . Glaucoma   . H/O paroxysmal supraventricular tachycardia    documented by Holter Monitor  . Hemorrhoids   . Hiatal hernia   . History of ovarian cyst   . HTN (hypertension)   . Insomnia   . Palpitations   . Thrombocytosis [facility])     HOSPITAL COURSE:   83 year old female with past medical history of previous meningioma, atrial fibrillation, essential hypertension who presented to the hospital due to some slurred speech.  1. TIA/CVA-this was the working diagnosis given patient's slurred speech. Patient's speech was actually at baseline upon admission. -Patient underwent an extensive workup including a CT of the head, MRI/MRA of the brain which were negative for acute pathology. It did show an old meningioma which is unchanged. She also underwent a carotid duplex which showed no hemodynamically significant carotid artery stenosis. -She has no further neurologic symptoms and a slurred speech has resolved. She is being discharged back to her assisted living. She will continue her aspirin.  2. History of atrial fibrillation-rate controlled. Patient will continue metoprolol, aspirin.  3. Essential hypertension-patient will continue HCTZ, ramipril.  4. Glaucoma-patient will continue her timolol eyedrops.  DISCHARGE CONDITIONS:   Stable  CONSULTS OBTAINED:    DRUG ALLERGIES:   Allergies  Allergen  Reactions  . Amoxil [Amoxicillin] Anxiety  . Codeine   . Pacerone  [Amiodarone Hcl] Nausea And Vomiting  . Sulfa Drugs Cross Reactors   . Amlodipine Other (See Comments)    Mild edema    DISCHARGE MEDICATIONS:   Allergies as of 11/27/2017      Reactions   Amoxil [amoxicillin] Anxiety   Codeine    Pacerone  [amiodarone Hcl] Nausea And Vomiting   Sulfa Drugs Cross Reactors    Amlodipine Other (See Comments)   Mild edema      Medication List    TAKE these medications   acetaminophen 325 MG tablet Commonly known as:  TYLENOL Take 325-650 mg by mouth every 4 (four) hours as needed for mild pain or fever.   aspirin 81 MG tablet Take 81 mg by mouth daily.   BENADRYL 25 mg capsule Generic drug:  diphenhydrAMINE Take 25 mg by mouth every 4 (four) hours as needed.   carbamide peroxide 6.5 % OTIC solution Commonly known as:  DEBROX 5 drops 5 (five) times daily.   CENTRUM SILVER PO Take 1 tablet by mouth daily.   dextromethorphan-guaiFENesin 10-100 MG/5ML liquid Commonly known as:  ROBITUSSIN-DM Take 10 mLs by mouth every 4 (four) hours as needed for cough.   hydrochlorothiazide 12.5 MG tablet Commonly known as:  HYDRODIURIL Take 1 tablet (12.5 mg total) by mouth daily.   KAOPECTATE 262 MG/15ML suspension Generic drug:  bismuth subsalicylate Take 10 mLs by mouth 3 (three) times daily as needed.   metoprolol succinate 50 MG 24 hr tablet Commonly known as:  TOPROL-XL Take 1 tablet (50 mg total) by  mouth daily with supper. Take with or immediately following a meal.   MILK OF MAGNESIA PO Take 30 mLs by mouth daily as needed.   MYLANTA GAS PO Take 30 mLs by mouth every 4 (four) hours as needed.   nystatin powder Commonly known as:  MYCOSTATIN/NYSTOP Apply 1 Bottle topically 2 (two) times daily.   ramipril 10 MG capsule Commonly known as:  ALTACE TAKE ONE CAPSULE BY MOUTH ONCE DAILY   timolol 0.5 % ophthalmic solution Commonly known as:  TIMOPTIC Place 1 drop  into both eyes daily.   zolpidem 5 MG tablet Commonly known as:  AMBIEN TAKE ONE-HALF TO ONE TABLET BY MOUTH AT BEDTIME AS NEEDED FOR SLEEP *LIMIT  TO  ONE  TO  TWO  PER  WEEK*         DISCHARGE INSTRUCTIONS:   DIET:  Cardiac diet  DISCHARGE CONDITION:  Stable  ACTIVITY:  Activity as tolerated  OXYGEN:  Home Oxygen: No.   Oxygen Delivery: room air  DISCHARGE LOCATION:  Assisted Living   If you experience worsening of your admission symptoms, develop shortness of breath, life threatening emergency, suicidal or homicidal thoughts you must seek medical attention immediately by calling 911 or calling your MD immediately  if symptoms less severe.  You Must read complete instructions/literature along with all the possible adverse reactions/side effects for all the Medicines you take and that have been prescribed to you. Take any new Medicines after you have completely understood and accpet all the possible adverse reactions/side effects.   Please note  You were cared for by a hospitalist during your hospital stay. If you have any questions about your discharge medications or the care you received while you were in the hospital after you are discharged, you can call the unit and asked to speak with the hospitalist on call if the hospitalist that took care of you is not available. Once you are discharged, your primary care physician will handle any further medical issues. Please note that NO REFILLS for any discharge medications will be authorized once you are discharged, as it is imperative that you return to your primary care physician (or establish a relationship with a primary care physician if you do not have one) for your aftercare needs so that they can reassess your need for medications and monitor your lab values.     Today   No further slurred speech. No headache, numbness, tingling or any focal weakness.  VITAL SIGNS:  Blood pressure 139/80, pulse 77, temperature 98.2  F (36.8 C), temperature source Oral, resp. rate 18, height 5\' 3"  (1.6 m), weight 70.8 kg (156 lb), SpO2 95 %.  I/O:    Intake/Output Summary (Last 24 hours) at 11/27/2017 1457 Last data filed at 11/27/2017 0956 Gross per 24 hour  Intake 555 ml  Output 450 ml  Net 105 ml    PHYSICAL EXAMINATION:  GENERAL:  83 y.o.-year-old patient lying in the bed with no acute distress.  EYES: Pupils equal, round, reactive to light and accommodation. No scleral icterus. Extraocular muscles intact.  HEENT: Head atraumatic, normocephalic. Oropharynx and nasopharynx clear.  NECK:  Supple, no jugular venous distention. No thyroid enlargement, no tenderness.  LUNGS: Normal breath sounds bilaterally, no wheezing, rales,rhonchi. No use of accessory muscles of respiration.  CARDIOVASCULAR: S1, S2 normal. No murmurs, rubs, or gallops.  ABDOMEN: Soft, non-tender, non-distended. Bowel sounds present. No organomegaly or mass.  EXTREMITIES: No pedal edema, cyanosis, or clubbing.  NEUROLOGIC: Cranial nerves II through XII  are intact. No focal motor or sensory defecits b/l.  PSYCHIATRIC: The patient is alert and oriented x 3.  SKIN: No obvious rash, lesion, or ulcer.   DATA REVIEW:   CBC Recent Labs  Lab 11/26/17 1633  WBC 10.5  HGB 14.5  HCT 41.7  PLT 665*    Chemistries  Recent Labs  Lab 11/26/17 1727  NA 131*  K 3.9  CL 95*  CO2 31  GLUCOSE 118*  BUN 19  CREATININE 0.71  CALCIUM 8.8*  AST 19  ALT 13*  ALKPHOS 54  BILITOT 0.6    Cardiac Enzymes Recent Labs  Lab 11/26/17 1727  TROPONINI <0.03    Microbiology Results  Results for orders placed or performed during the hospital encounter of 11/26/17  MRSA PCR Screening     Status: None   Collection Time: 11/26/17 11:25 PM  Result Value Ref Range Status   MRSA by PCR NEGATIVE NEGATIVE Final    Comment:        The GeneXpert MRSA Assay (FDA approved for NASAL specimens only), is one component of a comprehensive MRSA  colonization surveillance program. It is not intended to diagnose MRSA infection nor to guide or monitor treatment for MRSA infections.     RADIOLOGY:  Mr Jeri Cos Wo Contrast  Result Date: 11/26/2017 CLINICAL DATA:  Initial evaluation for acute episode of slurred speech, now resolved. EXAM: MRI HEAD WITHOUT AND WITH CONTRAST MRA HEAD WITHOUT CONTRAST TECHNIQUE: Multiplanar, multiecho pulse sequences of the brain and surrounding structures were obtained without and with intravenous contrast. Angiographic images of the head were obtained using MRA technique without contrast. CONTRAST:  28mL MULTIHANCE GADOBENATE DIMEGLUMINE 529 MG/ML IV SOLN COMPARISON:  Prior CT from earlier the same day as well as previous MRI from 08/03/2014. FINDINGS: MRI HEAD FINDINGS Brain: Diffuse prominence of the CSF containing spaces compatible with generalized age related cerebral atrophy. Patchy and confluent T2/FLAIR hyperintensity within the periventricular white matter most consistent with chronic small vessel to repeat. Chronic microvascular changes present within the pons. Few scattered superimposed remote lacunar infarcts noted within the pons, left corona radiata, and right thalamus. No abnormal foci of restricted diffusion to suggest acute or subacute ischemia. Gray-white matter differentiation maintained. No other evidence for chronic infarction. No acute or chronic intracranial hemorrhage. Extra-axial meningioma positioned at the right temporal occipital convexity again seen measuring 3.0 x 2.5 x 2.7 cm (series 18, image 27). Mild localized edema without significant regional mass effect. This is increased in size from previous MRI from 2015 (previously measuring up to 2.3 cm). No other mass lesion. No midline shift or mass effect. No hydrocephalus. No extra-axial fluid collection. Major dural sinuses are grossly patent. No other abnormal enhancement. Pituitary gland suprasellar region within normal limits. Midline  structures intact and normal. Vascular: Major intracranial vascular flow voids are maintained. Skull and upper cervical spine: Craniocervical junction normal. Mild degenerate spondylolysis noted within the upper cervical spine without significant stenosis. Bone marrow signal intensity within normal limits. No scalp soft tissue abnormality. Sinuses/Orbits: Globes and orbital soft tissues within normal limits. Patient status post lens extraction bilaterally. Mild scattered mucosal thickening within the ethmoidal air cells and lateral recess of the right sphenoid sinus. Paranasal sinuses are otherwise clear. Small left mastoid effusion noted, of doubtful significance. Inner ear structures normal. Other: None. MRA HEAD FINDINGS ANTERIOR CIRCULATION: Distal cervical segments of the internal carotid arteries are patent with antegrade flow. Petrous segments patent bilaterally. Mild atheromatous irregularity within the cavernous/ supraclinoid ICAs  without flow-limiting stenosis. A1 segments widely patent. Left A1 segment hypoplastic. Normal anterior communicating artery. Moderate to severe proximal right A2 stenosis with mild attenuation distally. Left A2 patent to its distal aspect without stenosis. M1 segments demonstrate mild atheromatous irregularity but are widely patent without stenosis. Normal MCA bifurcations. No proximal M2 occlusion. Distal MCA branches well perfused and symmetric. POSTERIOR CIRCULATION: Vertebral arteries patent to the vertebrobasilar junction without stenosis. Right vertebral artery slightly dominant. Posterior inferior cerebral artery is patent proximally. Basilar artery mildly irregular but patent to its distal aspect without stenosis. Superior cerebral arteries patent bilaterally. Right PCA supplied via the basilar. Predominant fetal type origin of the left PCA supplied via a patent left posterior communicating artery. Left PCA irregularity but patent to its distal aspect without high-grade  stenosis. Severe near occlusive right P2 stenosis. Distal right PCA branches are perfused. No aneurysm. IMPRESSION: MRI HEAD IMPRESSION: 1. No acute intracranial infarct or other abnormality identified. 2. 3.0 x 2.5 x 2.7 cm right temporal occipital meningioma. Mild localized edema without significant regional mass effect. 3. Age related cerebral atrophy with chronic microvascular ischemic disease as above. MRA HEAD IMPRESSION: 1. Negative intracranial MRA for large vessel occlusion. 2. Intracranial atherosclerosis. Most notable findings include moderate to severe proximal right A2 with severe right P2 stenoses. No other proximal or correctable stenosis identified. Electronically Signed   By: Jeannine Boga M.D.   On: 11/26/2017 23:26   US Carotid Bilateral (at [facility] And [facility] Only)  Result Date: 11/27/2017 CLINICAL DATA:  TIA, slurred speech and hypertension. EXAM: BILATERAL CAROTID DUPLEX ULTRASOUND TECHNIQUE: Pearline Cables scale imaging, color Doppler and duplex ultrasound were performed of bilateral carotid and vertebral arteries in the neck. COMPARISON:  None. FINDINGS: Criteria: Quantification of carotid stenosis is based on velocity parameters that correlate the residual internal carotid diameter with NASCET-based stenosis levels, using the diameter of the distal internal carotid lumen as the denominator for stenosis measurement. The following velocity measurements were obtained: RIGHT ICA:  77/17 cm/sec CCA:  84/16 cm/sec SYSTOLIC ICA/CCA RATIO:  1.2 DIASTOLIC ICA/CCA RATIO:  1.7 ECA:  106 cm/sec LEFT ICA:  69/18 cm/sec CCA:  60/63 cm/sec SYSTOLIC ICA/CCA RATIO:  0.9 DIASTOLIC ICA/CCA RATIO:  1.5 ECA:  109 cm/sec RIGHT CAROTID ARTERY: The common carotid artery shows mild intimal thickening. There is a moderate amount of predominately calcified plaque at the level of the carotid bulb that extends to the ICA origin. No velocity elevation identified. Estimated right ICA stenosis is less than 50%. RIGHT VERTEBRAL  ARTERY: Antegrade flow with normal waveform and velocity. LEFT CAROTID ARTERY: Mild amount of partially calcified plaque is present at the level of the carotid bulb and proximal left ICA. No velocity elevation. Estimated left ICA stenosis is less than 50%. LEFT VERTEBRAL ARTERY: Antegrade flow with normal waveform and velocity. IMPRESSION: Moderate amount of plaque at the level of the right carotid bulb and ICA origin. Mild plaque at the left carotid bulb and ICA origin. Bilateral estimated ICA stenoses are less than 50%. Electronically Signed   By: Aletta Edouard M.D.   On: 11/27/2017 14:48   Mr Jodene Nam Head/brain KZ Cm  Result Date: 11/26/2017 CLINICAL DATA:  Initial evaluation for acute episode of slurred speech, now resolved. EXAM: MRI HEAD WITHOUT AND WITH CONTRAST MRA HEAD WITHOUT CONTRAST TECHNIQUE: Multiplanar, multiecho pulse sequences of the brain and surrounding structures were obtained without and with intravenous contrast. Angiographic images of the head were obtained using MRA technique without contrast. CONTRAST:  68mL MULTIHANCE GADOBENATE  DIMEGLUMINE 529 MG/ML IV SOLN COMPARISON:  Prior CT from earlier the same day as well as previous MRI from 08/03/2014. FINDINGS: MRI HEAD FINDINGS Brain: Diffuse prominence of the CSF containing spaces compatible with generalized age related cerebral atrophy. Patchy and confluent T2/FLAIR hyperintensity within the periventricular white matter most consistent with chronic small vessel to repeat. Chronic microvascular changes present within the pons. Few scattered superimposed remote lacunar infarcts noted within the pons, left corona radiata, and right thalamus. No abnormal foci of restricted diffusion to suggest acute or subacute ischemia. Gray-white matter differentiation maintained. No other evidence for chronic infarction. No acute or chronic intracranial hemorrhage. Extra-axial meningioma positioned at the right temporal occipital convexity again seen  measuring 3.0 x 2.5 x 2.7 cm (series 18, image 27). Mild localized edema without significant regional mass effect. This is increased in size from previous MRI from 2015 (previously measuring up to 2.3 cm). No other mass lesion. No midline shift or mass effect. No hydrocephalus. No extra-axial fluid collection. Major dural sinuses are grossly patent. No other abnormal enhancement. Pituitary gland suprasellar region within normal limits. Midline structures intact and normal. Vascular: Major intracranial vascular flow voids are maintained. Skull and upper cervical spine: Craniocervical junction normal. Mild degenerate spondylolysis noted within the upper cervical spine without significant stenosis. Bone marrow signal intensity within normal limits. No scalp soft tissue abnormality. Sinuses/Orbits: Globes and orbital soft tissues within normal limits. Patient status post lens extraction bilaterally. Mild scattered mucosal thickening within the ethmoidal air cells and lateral recess of the right sphenoid sinus. Paranasal sinuses are otherwise clear. Small left mastoid effusion noted, of doubtful significance. Inner ear structures normal. Other: None. MRA HEAD FINDINGS ANTERIOR CIRCULATION: Distal cervical segments of the internal carotid arteries are patent with antegrade flow. Petrous segments patent bilaterally. Mild atheromatous irregularity within the cavernous/ supraclinoid ICAs without flow-limiting stenosis. A1 segments widely patent. Left A1 segment hypoplastic. Normal anterior communicating artery. Moderate to severe proximal right A2 stenosis with mild attenuation distally. Left A2 patent to its distal aspect without stenosis. M1 segments demonstrate mild atheromatous irregularity but are widely patent without stenosis. Normal MCA bifurcations. No proximal M2 occlusion. Distal MCA branches well perfused and symmetric. POSTERIOR CIRCULATION: Vertebral arteries patent to the vertebrobasilar junction without  stenosis. Right vertebral artery slightly dominant. Posterior inferior cerebral artery is patent proximally. Basilar artery mildly irregular but patent to its distal aspect without stenosis. Superior cerebral arteries patent bilaterally. Right PCA supplied via the basilar. Predominant fetal type origin of the left PCA supplied via a patent left posterior communicating artery. Left PCA irregularity but patent to its distal aspect without high-grade stenosis. Severe near occlusive right P2 stenosis. Distal right PCA branches are perfused. No aneurysm. IMPRESSION: MRI HEAD IMPRESSION: 1. No acute intracranial infarct or other abnormality identified. 2. 3.0 x 2.5 x 2.7 cm right temporal occipital meningioma. Mild localized edema without significant regional mass effect. 3. Age related cerebral atrophy with chronic microvascular ischemic disease as above. MRA HEAD IMPRESSION: 1. Negative intracranial MRA for large vessel occlusion. 2. Intracranial atherosclerosis. Most notable findings include moderate to severe proximal right A2 with severe right P2 stenoses. No other proximal or correctable stenosis identified. Electronically Signed   By: Jeannine Boga M.D.   On: 11/26/2017 23:26   Ct Head Code Stroke Wo Contrast  Addendum Date: 11/26/2017   ADDENDUM REPORT: 11/26/2017 17:02 ADDENDUM: Study discussed by telephone with Dr. Eula Listen on 11/26/2017 at 1659 hours. Electronically Signed   By: Herminio Heads.D.  On: 11/26/2017 17:02   Result Date: 11/26/2017 CLINICAL DATA:  Code stroke. 83 year old female with slurred speech upon waking from nap at 1530 hours. Last seen well at lunch. EXAM: CT HEAD WITHOUT CONTRAST TECHNIQUE: Contiguous axial images were obtained from the base of the skull through the vertex without intravenous contrast. COMPARISON:  07/10/2017 head CT and earlier. FINDINGS: Brain: Chronic right occipital convexity 2.5 cm meningioma (series 2, image 14) appears minimally enlarged since  the MRI on 08/03/2014. Stable regional mass effect. Mild associated right occipital lobe edema appears chronic and stable. No other intracranial mass or mass effect. Stable cerebral volume. Patchy and confluent bilateral cerebral white matter hypodensity appears stable. No cortically based acute infarct identified. No acute intracranial hemorrhage identified. No ventriculomegaly. Vascular: Calcified atherosclerosis at the skull base. No suspicious intracranial vascular hyperdensity. Skull: No acute osseous abnormality identified. Sinuses/Orbits: Visualized paranasal sinuses and mastoids are stable and well pneumatized. Other: No acute orbit or scalp soft tissue findings. ASPECTS [facility] Stroke Program Early CT Score) - Ganglionic level infarction (caudate, lentiform nuclei, internal capsule, insula, M1-M3 cortex): 7 - Supraganglionic infarction (M4-M6 cortex): 3 Total score (0-10 with 10 being normal): 10 IMPRESSION: 1. No acute intracranial hemorrhage or acute cortically based infarct identified. ASPECTS is 10. 2. Chronic right occipital meningioma appears minimally increased since 2015. Mild chronic associated right occipital lobe edema. 3. Mild for age chronic white matter changes elsewhere, probably due to small vessel disease, appear stable since July. Electronically Signed: By: Genevie Ann M.D. On: 11/26/2017 16:56      Management plans discussed with the patient, family and they are in agreement.  CODE STATUS:     Code Status Orders  (From admission, onward)        Start     Ordered   11/26/17 1940  Full code  Continuous     11/26/17 1939    Code Status History    Date Active Date Inactive Code Status Order ID Comments User Context   This patient has a current code status but no historical code status.    Advance Directive Documentation     Most Recent Value  Type of Advance Directive  Living will, Healthcare Power of Attorney  Pre-existing out of facility DNR order (yellow form or pink  MOST form)  No data  "MOST" Form in Place?  No data      TOTAL TIME TAKING CARE OF THIS PATIENT: 40 minutes.    Henreitta Leber M.D on 11/27/2017 at 2:57 PM  Between 7am to 6pm - Pager - [phone]  After 6pm go to [email or web address] - Proofreader  [facility]  Office  [phone]  CC: Primary care physician; Venia Carbon, MD

## 2017-11-27 NOTE — Care Management Obs Status (Signed)
[place] NOTIFICATION   Patient Details  Name: Saki Legore MRN: [id number] Date of Birth: 1920/01/17   Medicare Observation Status Notification Given:  Yes Notice signed, one given to patient and the other to HIM for scanning    Katrina Stack, RN 11/27/2017, 12:49 PM

## 2017-11-27 NOTE — NC FL2 (Signed)
[place] LEVEL OF CARE SCREENING TOOL     IDENTIFICATION  Patient Name: Shelly Padilla Birthdate: May 23, 1920 Sex: female Admission Date (Current Location): 11/26/2017  [facility] and [place] Number:      Facility and Address:  [facility], [address] [place], [place] [address]      Provider Number: [id number]  Attending Physician Name and Address:  Henreitta Leber, MD  Relative Name and Phone Number:       Current Level of Care: Hospital Recommended Level of Care: [place] Prior Approval Number:    Date Approved/Denied:   PASRR Number:    Discharge Plan: Domiciliary (Rest home)    Current Diagnoses: Patient Active Problem List   Diagnosis Date Noted  . TIA (transient ischemic attack) 11/26/2017  . Chronic venous insufficiency 06/09/2016  . Right inguinal hernia 06/09/2016  . DD (diverticular disease) 04/10/2016  . Glaucoma 04/10/2016  . Hemorrhoid 04/10/2016  . Bilateral ovarian cysts 03/18/2016  . Thrombocytosis ([place])   . Preventative health care 06/28/2015  . Varicose veins 01/31/2015  . Advance directive discussed with patient 12/26/2014  . Meningioma ([place]) 10/01/2014  . Insomnia 05/18/2014  . Episodic mood disorder ([place]) 10/18/2012  . Paroxysmal supraventricular tachycardia ([place]) 03/08/2012  . HTN (hypertension) 03/08/2012  . PVC's (premature ventricular contractions) 03/08/2012    Orientation RESPIRATION BLADDER Height & Weight     Self, Time, Place  Normal Continent Weight: 156 lb (70.8 kg) Height:  5\' 3"  (160 cm)  BEHAVIORAL SYMPTOMS/MOOD NEUROLOGICAL BOWEL NUTRITION STATUS      Continent Diet(Heart healthy)  AMBULATORY STATUS COMMUNICATION OF NEEDS Skin   Independent Verbally Normal                       Personal Care Assistance Level of Assistance  Bathing, Feeding, Dressing   Feeding assistance: Independent Dressing Assistance: Independent     Functional Limitations Info              SPECIAL CARE FACTORS FREQUENCY                       Contractures      Additional Factors Info                  Current Medications (11/27/2017):  This is the current hospital active medication list Current Facility-Administered Medications  Medication Dose Route Frequency Provider Last Rate Last Dose  . 0.9 %  sodium chloride infusion   Intravenous Continuous Demetrios Loll, MD 50 mL/hr at 11/26/17 2043    . acetaminophen (TYLENOL) tablet 650 mg  650 mg Oral Q4H PRN Demetrios Loll, MD       Or  . acetaminophen (TYLENOL) solution 650 mg  650 mg Per Tube Q4H PRN Demetrios Loll, MD       Or  . acetaminophen (TYLENOL) suppository 650 mg  650 mg Rectal Q4H PRN Demetrios Loll, MD      . aspirin suppository 300 mg  300 mg Rectal Daily Demetrios Loll, MD       Or  . aspirin tablet 325 mg  325 mg Oral Daily Demetrios Loll, MD   325 mg at 11/27/17 4403  . dextromethorphan-guaiFENesin (ROBITUSSIN-DM) 10-100 MG/5ML liquid 10 mL  10 mL Oral Q4H PRN Demetrios Loll, MD      . diphenhydrAMINE (BENADRYL) capsule 25 mg  25 mg Oral Q4H PRN Demetrios Loll, MD      . enoxaparin (LOVENOX) injection  40 mg  40 mg Subcutaneous Q24H Demetrios Loll, MD      . hydrochlorothiazide (HYDRODIURIL) tablet 12.5 mg  12.5 mg Oral Daily Demetrios Loll, MD   12.5 mg at 11/27/17 1638  . metoprolol succinate (TOPROL-XL) 24 hr tablet 50 mg  50 mg Oral Q supper Demetrios Loll, MD      . ramipril (ALTACE) capsule 10 mg  10 mg Oral Daily Demetrios Loll, MD   10 mg at 11/27/17 0853  . senna-docusate (Senokot-S) tablet 1 tablet  1 tablet Oral QHS PRN Demetrios Loll, MD      . timolol (TIMOPTIC) 0.5 % ophthalmic solution 1 drop  1 drop Both Eyes Daily Demetrios Loll, MD   1 drop at 11/27/17 0853  . zolpidem (AMBIEN) tablet 5 mg  5 mg Oral QHS PRN Demetrios Loll, MD   5 mg at 11/27/17 0032     Discharge Medications: Please see discharge summary for a list of discharge medications. Medication List     TAKE these medications   acetaminophen 325 MG  tablet Commonly known as:  TYLENOL Take 325-650 mg by mouth every 4 (four) hours as needed for mild pain or fever.   aspirin 81 MG tablet Take 81 mg by mouth daily.   BENADRYL 25 mg capsule Generic drug:  diphenhydrAMINE Take 25 mg by mouth every 4 (four) hours as needed.   carbamide peroxide 6.5 % OTIC solution Commonly known as:  DEBROX 5 drops 5 (five) times daily.   CENTRUM SILVER PO Take 1 tablet by mouth daily.   dextromethorphan-guaiFENesin 10-100 MG/5ML liquid Commonly known as:  ROBITUSSIN-DM Take 10 mLs by mouth every 4 (four) hours as needed for cough.   hydrochlorothiazide 12.5 MG tablet Commonly known as:  HYDRODIURIL Take 1 tablet (12.5 mg total) by mouth daily.   KAOPECTATE 262 MG/15ML suspension Generic drug:  bismuth subsalicylate Take 10 mLs by mouth 3 (three) times daily as needed.   metoprolol succinate 50 MG 24 hr tablet Commonly known as:  TOPROL-XL Take 1 tablet (50 mg total) by mouth daily with supper. Take with or immediately following a meal.   MILK OF MAGNESIA PO Take 30 mLs by mouth daily as needed.   MYLANTA GAS PO Take 30 mLs by mouth every 4 (four) hours as needed.   nystatin powder Commonly known as:  MYCOSTATIN/NYSTOP Apply 1 Bottle topically 2 (two) times daily.   ramipril 10 MG capsule Commonly known as:  ALTACE TAKE ONE CAPSULE BY MOUTH ONCE DAILY   timolol 0.5 % ophthalmic solution Commonly known as:  TIMOPTIC Place 1 drop into both eyes daily.   zolpidem 5 MG tablet Commonly known as:  AMBIEN TAKE ONE-HALF TO ONE TABLET BY MOUTH AT BEDTIME AS NEEDED FOR SLEEP *LIMIT  TO  ONE  TO  TWO  PER  WEEK*        Relevant Imaging Results:  Relevant Lab Results:   Additional Information    Zettie Pho, LCSW

## 2017-11-27 NOTE — Progress Notes (Signed)
Chart reviewed, Pt visited. Plans for discharge today as deficits have resolved. Pt screened and there is no ST indicated at this time. Speech is fluent and appropriate.

## 2017-11-27 NOTE — Plan of Care (Signed)
Pt admitted last night to rule out CVA. NIH 0. Neuro checks wnl. No complaints of pain. Stroke handbook given, encouraged pt to read. Pt declined said she would look at it today. MRI performed.  Education: Knowledge of disease or condition will improve 11/27/2017 0449 - Progressing by Jeffie Pollock, RN Knowledge of secondary prevention will improve 11/27/2017 0449 - Progressing by Jeffie Pollock, RN Knowledge of patient specific risk factors addressed and post discharge goals established will improve 11/27/2017 0449 - Progressing by Jeffie Pollock, RN   Coping: Will verbalize positive feelings about self 11/27/2017 0449 - Progressing by Jeffie Pollock, RN Will identify appropriate support needs 11/27/2017 0449 - Progressing by Jeffie Pollock, RN   Health Behavior/Discharge Planning: Ability to manage health-related needs will improve 11/27/2017 0449 - Progressing by Jeffie Pollock, RN   Self-Care: Ability to participate in self-care as condition permits will improve 11/27/2017 0449 - Progressing by Jeffie Pollock, RN   Nutrition: Risk of aspiration will decrease 11/27/2017 0449 - Progressing by Jeffie Pollock, RN   Ischemic Stroke/TIA Tissue Perfusion: Complications of ischemic stroke/TIA will be minimized 11/27/2017 0449 - Progressing by Jeffie Pollock, RN   Clinical Measurements: Ability to maintain clinical measurements within normal limits will improve 11/27/2017 0449 - Progressing by Jeffie Pollock, RN

## 2017-11-27 NOTE — Evaluation (Signed)
Occupational Therapy Evaluation Patient Details Name: Shelly Padilla MRN: [id number] DOB: 1920/04/26 Today's Date: 11/27/2017    History of Present Illness Patient admitted to ED for slurred speech. Current work up being conducted for possible CVA.   Clinical Impression   Patient was sitting in recliner with friend present when OT arrived. Patient stated she was brought to ED due to slurred speech, but that it cleared up within an hour. She states she doesn't have any speech issues now, and has no weakness or other issues. Assessment of BUE performed, with no deficits noted between BUE. Patient able to perform standing with Modif I using rolling walker. Able to demonstrate ADL tasks at baseline level patient provided as PLOF. No need for continued OT services at this time. Patient educated on safety and will contact physician if needs change.    Follow Up Recommendations  No OT follow up    Equipment Recommendations       Recommendations for Other Services       Precautions / Restrictions Precautions Precautions: None Restrictions Weight Bearing Restrictions: No      Mobility Bed Mobility Overal bed mobility: Independent                Transfers Overall transfer level: Modified independent Equipment used: Rolling walker (2 wheeled)                  Balance Overall balance assessment: Modified Independent                                         ADL either performed or assessed with clinical judgement   ADL Overall ADL's : At baseline                                       General ADL Comments: Patient able to perform basic tasks at [facility] level. States she does not notice difference in performance at this time.     Vision Patient Visual Report: No change from baseline       Perception     Praxis      Pertinent Vitals/Pain Pain Assessment: No/denies pain     Hand Dominance Right   Extremity/Trunk Assessment  Upper Extremity Assessment Upper Extremity Assessment: Overall WFL for tasks assessed   Lower Extremity Assessment Lower Extremity Assessment: Defer to PT evaluation       Communication Communication Communication: No difficulties   Cognition Arousal/Alertness: Awake/alert Behavior During Therapy: WFL for tasks assessed/performed Overall Cognitive Status: Within Functional Limits for tasks assessed                                     General Comments       Exercises     Shoulder Instructions      Home Living Family/patient expects to be discharged to:: Assisted living                             Home Equipment: Walker - 4 wheels   Additional Comments: Patient lives at [facility]      Prior Functioning/Environment Level of Independence: Needs assistance  Gait / Transfers Assistance Needed: Ambulates with rollator ADL's / Homemaking  Assistance Needed: Patient has assist from ALF with bathing, and some dressing (fastners on bra and socks)            OT Problem List:        OT Treatment/Interventions:      OT Goals(Current goals can be found in the care plan section) Acute Rehab OT Goals Patient Stated Goal: I feel like the slurring is improved, and nothing else is wrong. I just want to go back home. OT Goal Formulation: With patient  OT Frequency:     Barriers to D/C:            Co-evaluation              AM-PAC PT "6 Clicks" Daily Activity     Outcome Measure                 End of Session Equipment Utilized During Treatment: Gait belt;Rolling walker  Activity Tolerance: Patient tolerated treatment well Patient left: in chair;with call bell/phone within reach;with chair alarm set;with family/visitor present  OT Visit Diagnosis: Other symptoms and signs involving the nervous system (L57.262)                Time: 0355-9741 OT Time Calculation (min): 18 min Charges:  OT General Charges $OT Visit: 1  Visit OT Evaluation $OT Eval Low Complexity: 1 Low G-Codes:     Shelly Padilla, OTR/L  Janyiah Silveri L 11/27/2017, 4:30 PM

## 2017-11-27 NOTE — Clinical Social Work Note (Signed)
Clinical Social Work Assessment  Patient Details  Name: Shelly Padilla MRN: [id number] Date of Birth: May 01, 1920  Date of referral:  11/27/17               Reason for consult:  Facility Placement                Permission sought to share information with:  Chartered certified accountant granted to share information::  Yes, Verbal Permission Granted  Name::        Agency::  [facility]  Relationship::     Contact Information:     Housing/Transportation Living arrangements for the past 2 months:  [place] of Information:  Patient, Medical Team Patient Interpreter Needed:  None Criminal Activity/Legal Involvement Pertinent to Current Situation/Hospitalization:  No - Comment as needed Significant Relationships:  Warehouse manager, [employer] Lives with:  Facility Resident Do you feel safe going back to the place where you live?  Yes Need for family participation in patient care:  No (Coment)  Care giving concerns:  Patient admitted from [place]   Social Worker assessment / plan:  CSW met with the patient at bedside to confirm that she is a resident of [facility]; the patient confirmed and indicated that she wishes to return on discharge and has transportation from a friend. The CSW has prepared all discharge material for the patient's ALF; the patient is set to discharge today as she had no findings from her stroke work-up and is stable with improved speech. CSW is signing off. Please consult should other needs arise.  Employment status:  Retired Forensic scientist:  Medicare PT Recommendations:  Not assessed at this time Information / Referral to community resources:     Patient/Family's Response to care:  The patient thanked the CSW for assistance.  Patient/Family's Understanding of and Emotional Response to Diagnosis, Current Treatment, and Prognosis:  The patient is in agreement with return to her facility.  Emotional  Assessment Appearance:  Appears younger than stated age Attitude/Demeanor/Rapport:  (Pleasant, Cooperative, Bright, and alert) Affect (typically observed):  Appropriate, Pleasant Orientation:  Oriented to Self, Oriented to Place, Oriented to  Time, Oriented to Situation Alcohol / Substance use:  Never Used Psych involvement (Current and /or in the community):  No (Comment)  Discharge Needs  Concerns to be addressed:  Care Coordination Readmission within the last 30 days:  No Current discharge risk:  None Barriers to Discharge:  No Barriers Identified   Zettie Pho, LCSW 11/27/2017, 3:21 PM

## 2017-11-27 NOTE — Discharge Instructions (Signed)
Ischemic Stroke °An ischemic stroke is the sudden death of brain tissue. Blood carries oxygen to all areas of the body. This type of stroke happens when your blood does not flow to your brain like normal. Your brain cannot get the oxygen it needs. This is an emergency. It must be treated right away. °Symptoms of a stroke usually happen all of a sudden. You may notice them when you wake up. They can include: °· Weakness or loss of feeling in your face, arm, or leg. This often happens on one side of the body. °· Trouble walking. °· Trouble moving your arms or legs. °· Loss of balance or coordination. °· Feeling confused. °· Trouble talking or understanding what people are saying. °· Slurred speech. °· Trouble seeing. °· Seeing two of one object (double vision). °· Feeling dizzy. °· Feeling sick to your stomach (nauseous) and throwing up (vomiting). °· A very bad headache for no reason. ° °Get help as soon as any of these problems start. This is important. Some treatments work better if they are given right away. These include: °· Aspirin. °· Medicines to control blood pressure. °· A shot (injection) of medicine to break up the blood clot. °· Treatments given in the blood vessel (artery) to take out the clot or break it up. ° °Other treatments may include: °· Oxygen. °· Fluids given through an IV tube. °· Medicines to thin out your blood. °· Procedures to help your blood flow better. ° °What increases the risk? °Certain things may make you more likely to have a stroke. Some of these are things that you can change, such as: °· Being very overweight (obesity). °· Smoking. °· Taking birth control pills. °· Not being active. °· Drinking too much alcohol. °· Using drugs. ° °Other risk factors include: °· High blood pressure. °· High cholesterol. °· Diabetes. °· Heart disease. °· Being African American, Native American, Hispanic, or [place] Native. °· Being over age 60. °· Family history of stroke. °· Having had blood clots,  stroke, or warning stroke (transient ischemic attack, TIA) in the past. °· Sickle cell disease. °· Being a woman with a history of high blood pressure in pregnancy (preeclampsia). °· Migraine headache. °· Sleep apnea. °· Having an irregular heartbeat (atrial fibrillation). °· Long-term (chronic) diseases that cause soreness and swelling (inflammation). °· Disorders that affect how your blood clots. ° °Follow these instructions at home: °Medicines °· Take over-the-counter and prescription medicines only as told by your doctor. °· If you were told to take aspirin or another medicine to thin your blood, take it exactly as told by your doctor. °? Taking too much of the medicine can cause bleeding. °? If you do not take enough, it may not work as well. °· Know the side effects of your medicines. If you are taking a blood thinner, make sure you: °? Hold pressure over any cuts for longer than usual. °? Tell your dentist and other doctors that you take this medicine. °? Avoid activities that may cause damage or injury to your body. °Eating and drinking °· Follow instructions from your doctor about what you cannot eat or drink. °· Eat healthy foods. °· If you have trouble with swallowing, do these things to avoid choking: °? Take small bites when eating. °? Eat foods that are soft or pureed. °Safety °· Follow instructions from your health care team about physical activity. °· Use a walker or cane as told by your doctor. °· Keep your home safe so   you do not fall. This may include: °? Having experts look at your home to make sure it is safe. °? Putting grab bars in the bedroom and bathroom. °? Using raised toilets. °? Putting a seat in the shower. °General instructions °· Do not use any tobacco products. °? Examples of these are cigarettes, chewing tobacco, and e-cigarettes. °? If you need help quitting, ask your doctor. °· Limit how much alcohol you drink. This means no more than 1 drink a day for nonpregnant women and 2  drinks a day for men. One drink equals 12 oz of beer, 5 oz of wine, or 1½ oz of hard liquor. °· If you need help to stop using drugs or alcohol, ask your doctor to refer you to a program or specialist. °· Stay active. Exercise as told by your doctor. °· Keep all follow-up visits as told by your doctor. This is important. °Get help right away if: °· You suddenly: °? Have weakness or loss of feeling in your face, arm, or leg. °? Feel confused. °? Have trouble talking or understanding what people are saying. °? Have trouble seeing. °? Have trouble walking. °? Have trouble moving your arms or legs. °? Feel dizzy. °? Lose your balance or coordination. °? Have a very bad headache and you do not know why. °· You pass out (lose consciousness) or almost pass out. °· You have jerky movements that you cannot control (seizure). °These symptoms may be an emergency. Do not wait to see if the symptoms will go away. Get medical help right away. Call your local emergency services (911 in the [place] Do not drive yourself to the hospital. °This information is not intended to replace advice given to you by your health care provider. Make sure you discuss any questions you have with your health care provider. °Document Released: 11/19/2011 Document Revised: 05/12/2016 Document Reviewed: 02/26/2016 °Elsevier Interactive Patient Education © 2018 Elsevier Inc. ° °

## 2017-11-27 NOTE — Care Management (Signed)
Patient is from [facility].  Notified Greig Right CSW.  Placed in observation for sx concerning for cva. Attending anticipates discharge today

## 2017-11-27 NOTE — Progress Notes (Signed)
*  PRELIMINARY RESULTS* Echocardiogram 2D Echocardiogram has been performed.  Shelly Padilla [name] 11/27/2017, 9:40 AM

## 2017-11-27 NOTE — Plan of Care (Signed)
Pt has completed stroke work up with negative findings. Pt has returned to her baseline with NIH score 0. Up walking with walker. VSS.  Up in chair in her room. Oral and written AVS instructions given to pt and friend with stated understanding. Friend is providing transport back to [facility].

## 2017-11-28 LAB — ECHOCARDIOGRAM COMPLETE
HEIGHTINCHES: 63 in
WEIGHTICAEL: 2496 [oz_av]

## 2017-11-28 LAB — HEMOGLOBIN A1C
HEMOGLOBIN A1C: 5.5 % (ref 4.8–5.6)
Mean Plasma Glucose: 111 mg/dL

## 2017-11-29 NOTE — Progress Notes (Signed)
   11/27/17 1630  OT Time Calculation  OT Start Time (ACUTE ONLY) 1130  OT Stop Time (ACUTE ONLY) 1148  OT Time Calculation (min) 18 min  OT G-codes **NOT FOR INPATIENT CLASS**  Functional Assessment Tool Used Clinical judgement  Functional Limitation Self care  Self Care Current Status (X1155) CI  Self Care Goal Status (M0802) CI  Self Care Discharge Status (M3361) CI  OT General Charges  $OT Visit 1 Visit  OT Evaluation  $OT Eval Low Complexity 1 Low    Late-entry G-codes entered after review of initial evaluation/documentation by Amie Portland, OT.  Time entered ahead of note file time due to patient discharge from system (unable to date/time note post-discharge to file aside initial eval).  Dadrian Ballantine H. Owens Shark, PT, DPT, NCS 11/29/17, 8:21 AM [phone]

## 2017-12-03 ENCOUNTER — Ambulatory Visit: Payer: Medicare Other | Admitting: Internal Medicine

## 2017-12-03 VITALS — BP 120/72 | HR 68 | Temp 97.8°F | Resp 16 | Wt 154.0 lb

## 2017-12-03 DIAGNOSIS — M79674 Pain in right toe(s): Secondary | ICD-10-CM

## 2017-12-03 DIAGNOSIS — G459 Transient cerebral ischemic attack, unspecified: Secondary | ICD-10-CM

## 2017-12-03 DIAGNOSIS — R609 Edema, unspecified: Secondary | ICD-10-CM | POA: Diagnosis not present

## 2017-12-06 ENCOUNTER — Encounter: Payer: Self-pay | Admitting: Internal Medicine

## 2017-12-06 NOTE — Patient Instructions (Signed)
Edema Edema is when you have too much fluid in your body or under your skin. Edema may make your legs, feet, and ankles swell up. Swelling is also common in looser tissues, like around your eyes. This is a common condition. It gets more common as you get older. There are many possible causes of edema. Eating too much salt (sodium) and being on your feet or sitting for a long time can cause edema in your legs, feet, and ankles. Hot weather may make edema worse. Edema is usually painless. Your skin may look swollen or shiny. Follow these instructions at home:  Keep the swollen body part raised (elevated) above the level of your heart when you are sitting or lying down.  Do not sit still or stand for a long time.  Do not wear tight clothes. Do not wear garters on your upper legs.  Exercise your legs. This can help the swelling go down.  Wear elastic bandages or support stockings as told by your doctor.  Eat a low-salt (low-sodium) diet to reduce fluid as told by your doctor.  Depending on the cause of your swelling, you may need to limit how much fluid you drink (fluid restriction).  Take over-the-counter and prescription medicines only as told by your doctor. Contact a doctor if:  Treatment is not working.  You have heart, liver, or kidney disease and have symptoms of edema.  You have sudden and unexplained weight gain. Get help right away if:  You have shortness of breath or chest pain.  You cannot breathe when you lie down.  You have pain, redness, or warmth in the swollen areas.  You have heart, liver, or kidney disease and get edema all of a sudden.  You have a fever and your symptoms get worse all of a sudden. Summary  Edema is when you have too much fluid in your body or under your skin.  Edema may make your legs, feet, and ankles swell up. Swelling is also common in looser tissues, like around your eyes.  Raise (elevate) the swollen body part above the level of your  heart when you are sitting or lying down.  Follow your doctor's instructions about diet and how much fluid you can drink (fluid restriction). This information is not intended to replace advice given to you by your health care provider. Make sure you discuss any questions you have with your health care provider. Document Released: 05/18/2008 Document Revised: 12/18/2016 Document Reviewed: 12/18/2016 Elsevier Interactive Patient Education  2017 Elsevier Inc.  

## 2017-12-06 NOTE — Progress Notes (Signed)
Subjective:    Patient ID: Shelly Padilla, female    DOB: 1920-11-04, 83 y.o.   MRN: [id number]  HPI  Asked to see resident in [address] for hospital follow up. Went to ER 12/15 with some AMS, slurred speech. She had a history of a fall 1 week prior, no head injury or LOC. Neuro checks had been intact. CT head showed:  IMPRESSION: 1. No acute intracranial hemorrhage or acute cortically based infarct identified. ASPECTS is 10. 2. Chronic right occipital meningioma appears minimally increased since 2015. Mild chronic associated right occipital lobe edema. 3. Mild for age chronic white matter changes elsewhere, probably due to small vessel disease, appear stable since July.  MRI/MRA of the brain showed:  IMPRESSION: MRI HEAD IMPRESSION:  1. No acute intracranial infarct or other abnormality identified. 2. 3.0 x 2.5 x 2.7 cm right temporal occipital meningioma. Mild localized edema without significant regional mass effect. 3. Age related cerebral atrophy with chronic microvascular ischemic disease as above.  MRA HEAD IMPRESSION:  1. Negative intracranial MRA for large vessel occlusion. 2. Intracranial atherosclerosis. Most notable findings include moderate to severe proximal right A2 with severe right P2 stenoses. No other proximal or correctable stenosis identified.  Carotid ultrasound did not show any extensive buildup. She was diagnosed with a TIA. She was continued on Metoprolol, ASA, HCTZ and Ramipril and discharged back to ALF.  Since that time, she has not had any recurrent AMS or slurred speech. She knows that her memory is not what it once was and this concerns her. She is still able to do things independently. Her biggest concern at this point is some swelling in her feet and toe pain of her right foot.  Review of Systems      Past Medical History:  Diagnosis Date  . Atrial fibrillation ([place])   . Brain tumor ([place])    meningioma  . Glaucoma   . H/O  paroxysmal supraventricular tachycardia    documented by Holter Monitor  . Hemorrhoids   . Hiatal hernia   . History of ovarian cyst   . HTN (hypertension)   . Insomnia   . Palpitations   . Thrombocytosis ([place])     Current Outpatient Medications  Medication Sig Dispense Refill  . acetaminophen (TYLENOL) 325 MG tablet Take 325-650 mg by mouth every 4 (four) hours as needed for mild pain or fever.     Marland Kitchen aspirin 81 MG tablet Take 81 mg by mouth daily.    Marland Kitchen bismuth subsalicylate (KAOPECTATE) 262 MG/15ML suspension Take 10 mLs by mouth 3 (three) times daily as needed.     . carbamide peroxide (DEBROX) 6.5 % OTIC solution 5 drops 5 (five) times daily.     Marland Kitchen dextromethorphan-guaiFENesin (ROBITUSSIN-DM) 10-100 MG/5ML liquid Take 10 mLs by mouth every 4 (four) hours as needed for cough.    . diphenhydrAMINE (BENADRYL) 25 mg capsule Take 25 mg by mouth every 4 (four) hours as needed.     . hydrochlorothiazide (HYDRODIURIL) 12.5 MG tablet Take 1 tablet (12.5 mg total) by mouth daily. 30 tablet 3  . Magnesium Hydroxide (MILK OF MAGNESIA PO) Take 30 mLs by mouth daily as needed.     . metoprolol succinate (TOPROL-XL) 50 MG 24 hr tablet Take 1 tablet (50 mg total) by mouth daily with supper. Take with or immediately following a meal. 30 tablet 6  . Multiple Vitamins-Minerals (CENTRUM SILVER PO) Take 1 tablet by mouth daily.     Marland Kitchen nystatin (MYCOSTATIN/NYSTOP) powder  Apply 1 Bottle topically 2 (two) times daily.    . ramipril (ALTACE) 10 MG capsule TAKE ONE CAPSULE BY MOUTH ONCE DAILY 90 capsule 3  . Simethicone (MYLANTA GAS PO) Take 30 mLs by mouth every 4 (four) hours as needed.     . timolol (TIMOPTIC) 0.5 % ophthalmic solution Place 1 drop into both eyes daily.     Marland Kitchen zolpidem (AMBIEN) 5 MG tablet TAKE ONE-HALF TO ONE TABLET BY MOUTH AT BEDTIME AS NEEDED FOR SLEEP *LIMIT  TO  ONE  TO  TWO  PER  WEEK* 30 tablet 0   No current facility-administered medications for this visit.     Allergies    Allergen Reactions  . Amoxil [Amoxicillin] Anxiety  . Codeine   . Pacerone  [Amiodarone Hcl] Nausea And Vomiting  . Sulfa Drugs Cross Reactors   . Amlodipine Other (See Comments)    Mild edema    Family History  Problem Relation Age of Onset  . Heart attack Father   . Heart failure Brother     Social History   Socioeconomic History  . Marital status: Widowed    Spouse name: Not on file  . Number of children: 1  . Years of education: Not on file  . Highest education level: Not on file  Social Needs  . Financial resource strain: Not on file  . Food insecurity - worry: Not on file  . Food insecurity - inability: Not on file  . Transportation needs - medical: Not on file  . Transportation needs - non-medical: Not on file  Occupational History  . Occupation: retired    Comment: Network engineer  Tobacco Use  . Smoking status: Never Smoker  . Smokeless tobacco: Never Used  Substance and Sexual Activity  . Alcohol use: Yes    Alcohol/week: 0.0 oz    Comment: occasionally wine  . Drug use: No  . Sexual activity: Not on file  Other Topics Concern  . Not on file  Social History Narrative   Primary care giver of husband with Alzheimer's--now in memory care      Has living will   Son is health care POA   Has DNR   No tube feeds if cognitively unaware     Constitutional: Denies fever, malaise, fatigue, headache or abrupt weight changes.  Respiratory: Denies difficulty breathing, shortness of breath, cough or sputum production.   Cardiovascular: Pt reports swelling in feet. Denies chest pain, chest tightness, palpitations or swelling in the hands.  Musculoskeletal: Pt reports toe pain on right foot. Denies decrease in range of motion, difficulty with gait, muscle pain.  Skin: Denies redness, rashes, lesions or ulcercations.  Neurological: Pt reports difficulty with recall. Denies dizziness, difficulty with speech or problems with balance and coordination.    No other  specific complaints in a complete review of systems (except as listed in HPI above).  Objective:   Physical Exam  BP 120/72   Pulse 68   Temp 97.8 F (36.6 C)   Resp 16   Wt 154 lb (69.9 kg)   BMI 27.28 kg/m  Wt Readings from Last 3 Encounters:  12/06/17 154 lb (69.9 kg)  11/26/17 156 lb (70.8 kg)  10/07/17 150 lb (68 kg)    General: Appears her stated age, in NAD. Skin: Warm, dry and intact. No redness, lesion or ulceration noted of the toes of the right foot. HEENT: Head: normal shape and size; Eyes: PERRLA and EOMs intact;  Cardiovascular: Normal rate and rhythm.  1+ BLE edema.  Pulmonary/Chest: Normal effort and positive vesicular breath sounds. No respiratory distress. No wheezes, rales or ronchi noted.  Musculoskeletal: Her 5th toe, right foot is curved and putting pressure on her 4 th toe.  Neurological: Alert and oriented.    BMET    Component Value Date/Time   NA 131 (L) 11/26/2017 1727   NA 139 01/08/2016 1210   NA 141 08/03/2014 0455   K 3.9 11/26/2017 1727   K 3.9 08/03/2014 0455   CL 95 (L) 11/26/2017 1727   CL 103 08/03/2014 0455   CO2 31 11/26/2017 1727   CO2 31 08/03/2014 0455   GLUCOSE 118 (H) 11/26/2017 1727   GLUCOSE 80 08/03/2014 0455   BUN 19 11/26/2017 1727   BUN 17 01/08/2016 1210   BUN 12 08/03/2014 0455   CREATININE 0.71 11/26/2017 1727   CREATININE 0.57 (L) 08/03/2014 0455   CALCIUM 8.8 (L) 11/26/2017 1727   CALCIUM 8.0 (L) 08/03/2014 0455   GFRNONAA >60 11/26/2017 1727   GFRNONAA >60 08/03/2014 0455   GFRAA >60 11/26/2017 1727   GFRAA >60 08/03/2014 0455    Lipid Panel     Component Value Date/Time   CHOL 138 11/27/2017 0607   CHOL 120 08/03/2014 0455   TRIG 67 11/27/2017 0607   TRIG 64 08/03/2014 0455   HDL 48 11/27/2017 0607   HDL 48 08/03/2014 0455   CHOLHDL 2.9 11/27/2017 0607   VLDL 13 11/27/2017 0607   VLDL 13 08/03/2014 0455   LDLCALC 77 11/27/2017 0607   LDLCALC 59 08/03/2014 0455    CBC    Component Value  Date/Time   WBC 10.5 11/26/2017 1633   RBC 4.76 11/26/2017 1633   HGB 14.5 11/26/2017 1633   HGB 13.3 08/03/2014 0455   HCT 41.7 11/26/2017 1633   HCT 38.5 08/03/2014 0455   PLT 665 (H) 11/26/2017 1633   PLT 467 (H) 08/03/2014 0455   MCV 87.6 11/26/2017 1633   MCV 90 08/03/2014 0455   MCH 30.5 11/26/2017 1633   MCHC 34.8 11/26/2017 1633   RDW 13.6 11/26/2017 1633   RDW 13.2 08/03/2014 0455   LYMPHSABS 1.0 11/26/2017 1633   LYMPHSABS 1.4 08/03/2014 0455   MONOABS 1.2 (H) 11/26/2017 1633   MONOABS 1.1 (H) 08/03/2014 0455   EOSABS 0.1 11/26/2017 1633   EOSABS 0.1 08/03/2014 0455   BASOSABS 0.1 11/26/2017 1633   BASOSABS 0.1 08/03/2014 0455    Hgb A1C Lab Results  Component Value Date   HGBA1C 5.5 11/27/2017            Assessment & Plan:   Hospital Follow Up for TIA:  Hospital notes, labs and imaging reviewed with patient No further intervention needed at this time, will continue medical management and will monitor symptoms for now  Peripheral Edema:  Increase HCTZ to 25 mg daily Encourage elevation Will consider TEDS if no improvement  Toe Pain:  Will get cushioned toe pads and have her wear those for comfort  Will reassess as needed Webb Silversmith, NP

## 2018-02-21 DIAGNOSIS — H40051 Ocular hypertension, right eye: Secondary | ICD-10-CM | POA: Diagnosis not present

## 2018-03-03 ENCOUNTER — Encounter: Payer: Self-pay | Admitting: Internal Medicine

## 2018-03-03 ENCOUNTER — Ambulatory Visit: Payer: Medicare Other | Admitting: Internal Medicine

## 2018-03-03 VITALS — BP 128/70 | HR 74 | Temp 98.1°F | Resp 16 | Wt 154.2 lb

## 2018-03-03 DIAGNOSIS — G459 Transient cerebral ischemic attack, unspecified: Secondary | ICD-10-CM | POA: Diagnosis not present

## 2018-03-03 DIAGNOSIS — I471 Supraventricular tachycardia: Secondary | ICD-10-CM | POA: Diagnosis not present

## 2018-03-03 DIAGNOSIS — I10 Essential (primary) hypertension: Secondary | ICD-10-CM | POA: Diagnosis not present

## 2018-03-03 DIAGNOSIS — I6523 Occlusion and stenosis of bilateral carotid arteries: Secondary | ICD-10-CM

## 2018-03-03 DIAGNOSIS — D75839 Thrombocytosis, unspecified: Secondary | ICD-10-CM

## 2018-03-03 DIAGNOSIS — F39 Unspecified mood [affective] disorder: Secondary | ICD-10-CM | POA: Diagnosis not present

## 2018-03-03 DIAGNOSIS — D473 Essential (hemorrhagic) thrombocythemia: Secondary | ICD-10-CM

## 2018-03-03 DIAGNOSIS — I739 Peripheral vascular disease, unspecified: Secondary | ICD-10-CM

## 2018-03-03 DIAGNOSIS — D329 Benign neoplasm of meninges, unspecified: Secondary | ICD-10-CM

## 2018-03-03 DIAGNOSIS — I779 Disorder of arteries and arterioles, unspecified: Secondary | ICD-10-CM | POA: Insufficient documentation

## 2018-03-03 NOTE — Assessment & Plan Note (Signed)
BP Readings from Last 3 Encounters:  03/03/18 128/70  12/06/17 120/72  11/27/17 139/80   Good control Will change HCTZ to prn only in case it is adding to her instability

## 2018-03-03 NOTE — Assessment & Plan Note (Signed)
Bilateral stenosis noted after TIA Not enough to require any action

## 2018-03-03 NOTE — Assessment & Plan Note (Signed)
No apparent recurrence on her beta blocker

## 2018-03-03 NOTE — Assessment & Plan Note (Signed)
No thrombotic complications Will just recheck with next blood test

## 2018-03-03 NOTE — Assessment & Plan Note (Signed)
Small noted on MRI No action needed

## 2018-03-03 NOTE — Progress Notes (Signed)
Subjective:    Patient ID: Shelly Padilla, female    DOB: [birth year], 84 y.o.   MRN: [id number]  HPI Visit in her AL apartment for follow up of chronic health conditions Reviewed status with Luellen Pucker   Recent GI illness--but this has resolved now Just slightly loose stool today  Has TIA recently No ongoing neuro symptoms Ongoing balance problems Had fall despite the rollator MRI showed persistent meningioma Carotids show stenosis but not severe  Mood issues since being here Knows she needs to be here--but misses independence Does some activities--not anhedonic  Has had chronic right calf swelling Wonders if she still needs fluid pill--she doesn't like it  Still notes irregular heart rhythm No fast heart or true palpitations No chest pain--but has "just a little, not really" Breathing is okay No sig dizziness and no syncope  Platelets were elevated No thrombotic problems  Current Outpatient Medications on File Prior to Visit  Medication Sig Dispense Refill  . acetaminophen (TYLENOL) 325 MG tablet Take 325-650 mg by mouth every 4 (four) hours as needed for mild pain or fever.     Marland Kitchen aspirin 81 MG tablet Take 81 mg by mouth daily.    Marland Kitchen bismuth subsalicylate (KAOPECTATE) 262 MG/15ML suspension Take 10 mLs by mouth 3 (three) times daily as needed.     . carbamide peroxide (DEBROX) 6.5 % OTIC solution 5 drops 5 (five) times daily.     Marland Kitchen dextromethorphan-guaiFENesin (ROBITUSSIN-DM) 10-100 MG/5ML liquid Take 10 mLs by mouth every 4 (four) hours as needed for cough.    . hydrochlorothiazide (HYDRODIURIL) 12.5 MG tablet Take 1 tablet (12.5 mg total) by mouth daily. (Patient taking differently: Take 25 mg by mouth daily. ) 30 tablet 3  . Magnesium Hydroxide (MILK OF MAGNESIA PO) Take 30 mLs by mouth daily as needed.     . Multiple Vitamins-Minerals (CENTRUM SILVER PO) Take 1 tablet by mouth daily.     Marland Kitchen nystatin (MYCOSTATIN/NYSTOP) powder Apply 1 Bottle topically 2 (two) times daily.     . ramipril (ALTACE) 10 MG capsule TAKE ONE CAPSULE BY MOUTH ONCE DAILY 90 capsule 3  . Simethicone (MYLANTA GAS PO) Take 30 mLs by mouth every 4 (four) hours as needed.     . timolol (TIMOPTIC) 0.5 % ophthalmic solution Place 1 drop into both eyes daily.     Marland Kitchen zolpidem (AMBIEN) 5 MG tablet TAKE ONE-HALF TO ONE TABLET BY MOUTH AT BEDTIME AS NEEDED FOR SLEEP *LIMIT  TO  ONE  TO  TWO  PER  WEEK* 30 tablet 0  . metoprolol succinate (TOPROL-XL) 50 MG 24 hr tablet Take 1 tablet (50 mg total) by mouth daily with supper. Take with or immediately following a meal. 30 tablet 6   No current facility-administered medications on file prior to visit.     Allergies  Allergen Reactions  . Amoxil [Amoxicillin] Anxiety  . Codeine   . Pacerone  [Amiodarone Hcl] Nausea And Vomiting  . Sulfa Drugs Cross Reactors   . Amlodipine Other (See Comments)    Mild edema    Past Medical History:  Diagnosis Date  . Atrial fibrillation ([place])   . Brain tumor ([place])    meningioma  . Glaucoma   . H/O paroxysmal supraventricular tachycardia    documented by Holter Monitor  . Hemorrhoids   . Hiatal hernia   . History of ovarian cyst   . HTN (hypertension)   . Insomnia   . Palpitations   . Thrombocytosis ([place])  Past Surgical History:  Procedure Laterality Date  . CATARACT EXTRACTION     left eye  . Leg vein stripping  1970  . TONSILLECTOMY  1929    Family History  Problem Relation Age of Onset  . Heart attack Father   . Heart failure Brother     Social History   Socioeconomic History  . Marital status: Widowed    Spouse name: Not on file  . Number of children: 1  . Years of education: Not on file  . Highest education level: Not on file  Occupational History  . Occupation: retired    Comment: Transport planner  . Financial resource strain: Not on file  . Food insecurity:    Worry: Not on file    Inability: Not on file  . Transportation needs:    Medical: Not on file     Non-medical: Not on file  Tobacco Use  . Smoking status: Never Smoker  . Smokeless tobacco: Never Used  Substance and Sexual Activity  . Alcohol use: Yes    Alcohol/week: 0.0 oz    Comment: occasionally wine  . Drug use: No  . Sexual activity: Not on file  Lifestyle  . Physical activity:    Days per week: Not on file    Minutes per session: Not on file  . Stress: Not on file  Relationships  . Social connections:    Talks on phone: Not on file    Gets together: Not on file    Attends religious service: Not on file    Active member of club or organization: Not on file    Attends meetings of clubs or organizations: Not on file    Relationship status: Not on file  . Intimate partner violence:    Fear of current or ex partner: Not on file    Emotionally abused: Not on file    Physically abused: Not on file    Forced sexual activity: Not on file  Other Topics Concern  . Not on file  Social History Narrative   Primary care giver of husband with Alzheimer's--now in memory care      Has living will   Son is health care POA   Has DNR   No tube feeds if cognitively unaware   Review of Systems Appetite is okay Weight stable Some sleep problems--uses the zolpidem if really bad Notices that she is more forgetful-- "than I say, I'm 89, what do I expect"    Objective:   Physical Exam  Constitutional: No distress.  Neck: No thyromegaly present.  Cardiovascular: Normal rate, regular rhythm and normal heart sounds. Exam reveals no gallop.  No murmur heard. occ extra beats  Pulmonary/Chest: Effort normal and breath sounds normal. No respiratory distress. She has no wheezes. She has no rales.  Abdominal: Soft. There is no tenderness.  Musculoskeletal:  Slight calf edema  Lymphadenopathy:    She has no cervical adenopathy.  Skin: No rash noted.  Psychiatric: She has a normal mood and affect.          Assessment & Plan:

## 2018-03-03 NOTE — Assessment & Plan Note (Signed)
Chronic dysthymia now---needs AL, . Not really anhedonic Not MDD Discussed meds only if she worsens

## 2018-03-03 NOTE — Assessment & Plan Note (Signed)
No recurrent neuro symptoms

## 2018-04-25 DIAGNOSIS — D473 Essential (hemorrhagic) thrombocythemia: Secondary | ICD-10-CM | POA: Diagnosis not present

## 2018-04-25 DIAGNOSIS — I10 Essential (primary) hypertension: Secondary | ICD-10-CM | POA: Diagnosis not present

## 2018-06-08 ENCOUNTER — Ambulatory Visit: Payer: Medicare Other | Admitting: Internal Medicine

## 2018-06-08 DIAGNOSIS — I471 Supraventricular tachycardia: Secondary | ICD-10-CM | POA: Diagnosis not present

## 2018-06-08 DIAGNOSIS — F5101 Primary insomnia: Secondary | ICD-10-CM

## 2018-06-08 DIAGNOSIS — I10 Essential (primary) hypertension: Secondary | ICD-10-CM

## 2018-06-08 DIAGNOSIS — Z0289 Encounter for other administrative examinations: Secondary | ICD-10-CM

## 2018-06-18 ENCOUNTER — Encounter: Payer: Self-pay | Admitting: Internal Medicine

## 2018-06-18 NOTE — Assessment & Plan Note (Signed)
Start Melatonin 3 mg nightly

## 2018-06-18 NOTE — Assessment & Plan Note (Signed)
Continue Metoprolol and ASA 

## 2018-06-18 NOTE — Patient Instructions (Signed)
Place hypertension patient instructions here.  

## 2018-06-18 NOTE — Progress Notes (Signed)
Subjective:    Patient ID: Shelly Padilla, female    DOB: 1920-08-18, 84 y.o.   MRN: [id number]  HPI  Routine visit for resident in [address] No new concerns from RN Resident feels like she is not sleeping well off he Ambien, but does not want to go back on it. She has more trouble falling asleep She is independent with AD's, walks with walker when out of her room. No recent falls. Appetite good. Denies urinary issues. Occasional constipation, relief with Mirilax.  HTN: Controlled on Ramipril and Metoprolol.   Paroxysmal SVT: Stable on Metoprolol and ASA.   Review of Systems  Past Medical History:  Diagnosis Date  . Atrial fibrillation ([place])   . Brain tumor ([place])    meningioma  . Glaucoma   . H/O paroxysmal supraventricular tachycardia    documented by Holter Monitor  . Hemorrhoids   . Hiatal hernia   . History of ovarian cyst   . HTN (hypertension)   . Insomnia   . Palpitations   . Thrombocytosis ([place])     Current Outpatient Medications  Medication Sig Dispense Refill  . aspirin 81 MG tablet Take 81 mg by mouth daily.    . hydrochlorothiazide (HYDRODIURIL) 12.5 MG tablet Take 1 tablet (12.5 mg total) by mouth daily. (Patient taking differently: Take 25 mg by mouth daily. ) 30 tablet 3  . Magnesium Hydroxide (MILK OF MAGNESIA PO) Take 30 mLs by mouth daily as needed.     . ramipril (ALTACE) 10 MG capsule TAKE ONE CAPSULE BY MOUTH ONCE DAILY 90 capsule 3  . timolol (TIMOPTIC) 0.5 % ophthalmic solution Place 1 drop into both eyes daily.     Marland Kitchen acetaminophen (TYLENOL) 325 MG tablet Take 325-650 mg by mouth every 4 (four) hours as needed for mild pain or fever.     . bismuth subsalicylate (KAOPECTATE) 262 MG/15ML suspension Take 10 mLs by mouth 3 (three) times daily as needed.     . carbamide peroxide (DEBROX) 6.5 % OTIC solution 5 drops 5 (five) times daily.     Marland Kitchen dextromethorphan-guaiFENesin (ROBITUSSIN-DM) 10-100 MG/5ML liquid Take 10 mLs by mouth every 4 (four) hours as  needed for cough.    . metoprolol succinate (TOPROL-XL) 50 MG 24 hr tablet Take 1 tablet (50 mg total) by mouth daily with supper. Take with or immediately following a meal. 30 tablet 6  . Multiple Vitamins-Minerals (CENTRUM SILVER PO) Take 1 tablet by mouth daily.     Marland Kitchen nystatin (MYCOSTATIN/NYSTOP) powder Apply 1 Bottle topically 2 (two) times daily.    . Simethicone (MYLANTA GAS PO) Take 30 mLs by mouth every 4 (four) hours as needed.     . zolpidem (AMBIEN) 5 MG tablet TAKE ONE-HALF TO ONE TABLET BY MOUTH AT BEDTIME AS NEEDED FOR SLEEP *LIMIT  TO  ONE  TO  TWO  PER  WEEK* 30 tablet 0   No current facility-administered medications for this visit.     Allergies  Allergen Reactions  . Amoxil [Amoxicillin] Anxiety  . Codeine   . Pacerone  [Amiodarone Hcl] Nausea And Vomiting  . Sulfa Drugs Cross Reactors   . Amlodipine Other (See Comments)    Mild edema    Family History  Problem Relation Age of Onset  . Heart attack Father   . Heart failure Brother     Social History   Socioeconomic History  . Marital status: Widowed    Spouse name: Not on file  . Number of children:  1  . Years of education: Not on file  . Highest education level: Not on file  Occupational History  . Occupation: retired    Comment: Transport planner  . Financial resource strain: Not on file  . Food insecurity:    Worry: Not on file    Inability: Not on file  . Transportation needs:    Medical: Not on file    Non-medical: Not on file  Tobacco Use  . Smoking status: Never Smoker  . Smokeless tobacco: Never Used  Substance and Sexual Activity  . Alcohol use: Yes    Alcohol/week: 0.0 oz    Comment: occasionally wine  . Drug use: No  . Sexual activity: Not on file  Lifestyle  . Physical activity:    Days per week: Not on file    Minutes per session: Not on file  . Stress: Not on file  Relationships  . Social connections:    Talks on phone: Not on file    Gets together: Not on file     Attends religious service: Not on file    Active member of club or organization: Not on file    Attends meetings of clubs or organizations: Not on file    Relationship status: Not on file  . Intimate partner violence:    Fear of current or ex partner: Not on file    Emotionally abused: Not on file    Physically abused: Not on file    Forced sexual activity: Not on file  Other Topics Concern  . Not on file  Social History Narrative   Primary care giver of husband with Alzheimer's--now in memory care      Has living will   Son is health care POA   Has DNR   No tube feeds if cognitively unaware     Constitutional: Denies fever, malaise, fatigue, headache or abrupt weight changes.  Respiratory: Denies difficulty breathing, shortness of breath, cough or sputum production.   Cardiovascular: Denies chest pain, chest tightness, palpitations or swelling in the hands or feet.  Gastrointestinal: Denies abdominal pain, bloating, constipation, diarrhea or blood in the stool.  GU: Denies urgency, frequency, pain with urination, burning sensation, blood in urine, odor or discharge. Musculoskeletal: Denies decrease in range of motion, difficulty with gait, muscle pain or joint pain and swelling.  Skin: Denies redness, rashes, lesions or ulcercations.  Neurological: Denies dizziness, difficulty with memory, difficulty with speech or problems with balance and coordination.  Psych: Denies anxiety, depression, SI/HI.  No other specific complaints in a complete review of systems (except as listed in HPI above).     Objective:   Physical Exam   BP 133/82   Pulse 76   Temp 98.1 F (36.7 C)   Resp 14   Wt 153 lb (69.4 kg)   BMI 27.10 kg/m  Wt Readings from Last 3 Encounters:  06/18/18 153 lb (69.4 kg)  03/03/18 154 lb 3.2 oz (69.9 kg)  12/06/17 154 lb (69.9 kg)    General: Appears her stated age, in NAD. Neck:  No JVD or nodes. Cardiovascular: Normal rate and rhythm. S1,S2 noted.  No  murmur, rubs or gallops noted. No JVD or BLE edema.  Pulmonary/Chest: Normal effort and positive vesicular breath sounds. No respiratory distress. No wheezes, rales or ronchi noted.  Abdomen: Soft and nontender. Normal bowel sounds. No distention or masses noted.  Neurological: Alert and oriented.  Psychiatric: Mood and affect normal. Behavior is normal. Judgment and thought  content normal.     BMET    Component Value Date/Time   NA 131 (L) 11/26/2017 1727   NA 139 01/08/2016 1210   NA 141 08/03/2014 0455   K 3.9 11/26/2017 1727   K 3.9 08/03/2014 0455   CL 95 (L) 11/26/2017 1727   CL 103 08/03/2014 0455   CO2 31 11/26/2017 1727   CO2 31 08/03/2014 0455   GLUCOSE 118 (H) 11/26/2017 1727   GLUCOSE 80 08/03/2014 0455   BUN 19 11/26/2017 1727   BUN 17 01/08/2016 1210   BUN 12 08/03/2014 0455   CREATININE 0.71 11/26/2017 1727   CREATININE 0.57 (L) 08/03/2014 0455   CALCIUM 8.8 (L) 11/26/2017 1727   CALCIUM 8.0 (L) 08/03/2014 0455   GFRNONAA >60 11/26/2017 1727   GFRNONAA >60 08/03/2014 0455   GFRAA >60 11/26/2017 1727   GFRAA >60 08/03/2014 0455    Lipid Panel     Component Value Date/Time   CHOL 138 11/27/2017 0607   CHOL 120 08/03/2014 0455   TRIG 67 11/27/2017 0607   TRIG 64 08/03/2014 0455   HDL 48 11/27/2017 0607   HDL 48 08/03/2014 0455   CHOLHDL 2.9 11/27/2017 0607   VLDL 13 11/27/2017 0607   VLDL 13 08/03/2014 0455   LDLCALC 77 11/27/2017 0607   LDLCALC 59 08/03/2014 0455    CBC    Component Value Date/Time   WBC 10.5 11/26/2017 1633   RBC 4.76 11/26/2017 1633   HGB 14.5 11/26/2017 1633   HGB 13.3 08/03/2014 0455   HCT 41.7 11/26/2017 1633   HCT 38.5 08/03/2014 0455   PLT 665 (H) 11/26/2017 1633   PLT 467 (H) 08/03/2014 0455   MCV 87.6 11/26/2017 1633   MCV 90 08/03/2014 0455   MCH 30.5 11/26/2017 1633   MCHC 34.8 11/26/2017 1633   RDW 13.6 11/26/2017 1633   RDW 13.2 08/03/2014 0455   LYMPHSABS 1.0 11/26/2017 1633   LYMPHSABS 1.4 08/03/2014  0455   MONOABS 1.2 (H) 11/26/2017 1633   MONOABS 1.1 (H) 08/03/2014 0455   EOSABS 0.1 11/26/2017 1633   EOSABS 0.1 08/03/2014 0455   BASOSABS 0.1 11/26/2017 1633   BASOSABS 0.1 08/03/2014 0455    Hgb A1C Lab Results  Component Value Date   HGBA1C 5.5 11/27/2017           Assessment & Plan:

## 2018-06-18 NOTE — Assessment & Plan Note (Signed)
Continue Ramipril and Metoprolol

## 2018-06-30 DIAGNOSIS — H26492 Other secondary cataract, left eye: Secondary | ICD-10-CM | POA: Diagnosis not present

## 2018-07-03 ENCOUNTER — Other Ambulatory Visit: Payer: Self-pay

## 2018-07-03 ENCOUNTER — Encounter: Payer: Self-pay | Admitting: Radiology

## 2018-07-03 ENCOUNTER — Emergency Department: Payer: Medicare Other

## 2018-07-03 ENCOUNTER — Emergency Department
Admission: EM | Admit: 2018-07-03 | Discharge: 2018-07-04 | Disposition: A | Discharge status: Home / Self Care | Payer: Medicare Other | Attending: Emergency Medicine | Admitting: Emergency Medicine

## 2018-07-03 DIAGNOSIS — Z8673 Personal history of transient ischemic attack (TIA), and cerebral infarction without residual deficits: Secondary | ICD-10-CM | POA: Insufficient documentation

## 2018-07-03 DIAGNOSIS — K439 Ventral hernia without obstruction or gangrene: Secondary | ICD-10-CM | POA: Diagnosis not present

## 2018-07-03 DIAGNOSIS — Z79899 Other long term (current) drug therapy: Secondary | ICD-10-CM | POA: Insufficient documentation

## 2018-07-03 DIAGNOSIS — N83201 Unspecified ovarian cyst, right side: Secondary | ICD-10-CM

## 2018-07-03 DIAGNOSIS — Z7982 Long term (current) use of aspirin: Secondary | ICD-10-CM | POA: Insufficient documentation

## 2018-07-03 DIAGNOSIS — R1032 Left lower quadrant pain: Secondary | ICD-10-CM | POA: Diagnosis not present

## 2018-07-03 DIAGNOSIS — N281 Cyst of kidney, acquired: Secondary | ICD-10-CM | POA: Diagnosis not present

## 2018-07-03 DIAGNOSIS — I10 Essential (primary) hypertension: Secondary | ICD-10-CM | POA: Diagnosis not present

## 2018-07-03 DIAGNOSIS — R109 Unspecified abdominal pain: Secondary | ICD-10-CM | POA: Diagnosis not present

## 2018-07-03 DIAGNOSIS — R1030 Lower abdominal pain, unspecified: Secondary | ICD-10-CM | POA: Diagnosis present

## 2018-07-03 DIAGNOSIS — K2980 Duodenitis without bleeding: Secondary | ICD-10-CM | POA: Diagnosis not present

## 2018-07-03 LAB — COMPREHENSIVE METABOLIC PANEL
ALK PHOS: 57 U/L (ref 38–126)
ALT: 13 U/L (ref 0–44)
AST: 19 U/L (ref 15–41)
Albumin: 4.1 g/dL (ref 3.5–5.0)
Anion gap: 7 (ref 5–15)
BUN: 15 mg/dL (ref 8–23)
CALCIUM: 9 mg/dL (ref 8.9–10.3)
CO2: 30 mmol/L (ref 22–32)
CREATININE: 0.59 mg/dL (ref 0.44–1.00)
Chloride: 99 mmol/L (ref 98–111)
Glucose, Bld: 115 mg/dL — ABNORMAL HIGH (ref 70–99)
Potassium: 4.1 mmol/L (ref 3.5–5.1)
Sodium: 136 mmol/L (ref 135–145)
Total Bilirubin: 0.7 mg/dL (ref 0.3–1.2)
Total Protein: 7.1 g/dL (ref 6.5–8.1)

## 2018-07-03 LAB — CBC
HCT: 43.7 % (ref 35.0–47.0)
Hemoglobin: 15.2 g/dL (ref 12.0–16.0)
MCH: 30.7 pg (ref 26.0–34.0)
MCHC: 34.8 g/dL (ref 32.0–36.0)
MCV: 88 fL (ref 80.0–100.0)
PLATELETS: 686 10*3/uL — AB (ref 150–440)
RBC: 4.96 MIL/uL (ref 3.80–5.20)
RDW: 12.8 % (ref 11.5–14.5)
WBC: 10.1 10*3/uL (ref 3.6–11.0)

## 2018-07-03 LAB — URINALYSIS, COMPLETE (UACMP) WITH MICROSCOPIC
Bacteria, UA: NONE SEEN
Bilirubin Urine: NEGATIVE
GLUCOSE, UA: NEGATIVE mg/dL
HGB URINE DIPSTICK: NEGATIVE
KETONES UR: NEGATIVE mg/dL
LEUKOCYTES UA: NEGATIVE
Nitrite: NEGATIVE
PROTEIN: NEGATIVE mg/dL
Specific Gravity, Urine: 1.004 — ABNORMAL LOW (ref 1.005–1.030)
pH: 8 (ref 5.0–8.0)

## 2018-07-03 LAB — LIPASE, BLOOD: LIPASE: 24 U/L (ref 11–51)

## 2018-07-03 MED ORDER — IOPAMIDOL (ISOVUE-300) INJECTION 61%
100.0000 mL | Freq: Once | INTRAVENOUS | Status: AC | PRN
Start: 2018-07-03 — End: 2018-07-03
  Administered 2018-07-03: 100 mL via INTRAVENOUS

## 2018-07-03 MED ORDER — IOPAMIDOL (ISOVUE-300) INJECTION 61%
30.0000 mL | Freq: Once | INTRAVENOUS | Status: AC
  Administered 2018-07-03: 30 mL via ORAL

## 2018-07-03 NOTE — ED Triage Notes (Signed)
Pt arrives via ems from [facility] for lower abd pain and htn, fsbs of 100, bp of 211/103, heart rate of 124 per ems

## 2018-07-03 NOTE — ED Provider Notes (Signed)
[facility] Provider Note  Time seen: 10:47 PM  I have reviewed the triage vital signs and the nursing notes.   HISTORY  Chief Complaint Abdominal Pain and Hypertension    HPI Shelly Padilla is a 84 y.o. female with a past medical history of hypertension, TIA, presents to the emergency department for lower abdominal pain.  According to the patient throughout the day today she has been expensing lower abdominal pain also hurts when like she has had high blood pressure.  Upon arrival patient complains of pain mostly in her left lower quadrant of her abdomen, states it comes and goes at times it will be moderate and sharp and then it will relieve and she will be pain-free.  Denies any fever, denies any chest pain or trouble breathing.  Denies any dysuria or hematuria or diarrhea.  Denies black or bloody stool.  No nausea or vomiting   Past Medical History:  Diagnosis Date  . Atrial fibrillation ([place])   . Brain tumor ([place])    meningioma  . Glaucoma   . H/O paroxysmal supraventricular tachycardia    documented by Holter Monitor  . Hemorrhoids   . Hiatal hernia   . History of ovarian cyst   . HTN (hypertension)   . Insomnia   . Palpitations   . Thrombocytosis [facility])     Patient Active Problem List   Diagnosis Date Noted  . Carotid artery disease ([place]) 03/03/2018  . TIA (transient ischemic attack) 11/26/2017  . Chronic venous insufficiency 06/09/2016  . Right inguinal hernia 06/09/2016  . DD (diverticular disease) 04/10/2016  . Glaucoma 04/10/2016  . Hemorrhoid 04/10/2016  . Bilateral ovarian cysts 03/18/2016  . Thrombocytosis ([place])   . Preventative health care 06/28/2015  . Varicose veins 01/31/2015  . Advance directive discussed with patient 12/26/2014  . Meningioma ([place]) 10/01/2014  . Insomnia 05/18/2014  . Episodic mood disorder ([place]) 10/18/2012  . Paroxysmal supraventricular tachycardia ([place]) 03/08/2012  . HTN (hypertension)  03/08/2012    Past Surgical History:  Procedure Laterality Date  . CATARACT EXTRACTION     left eye  . Leg vein stripping  1970  . TONSILLECTOMY  1929    Prior to Admission medications   Medication Sig Start Date End Date Taking? Authorizing Provider  acetaminophen (TYLENOL) 325 MG tablet Take 325-650 mg by mouth every 4 (four) hours as needed for mild pain or fever.     [provider]  aspirin 81 MG tablet Take 81 mg by mouth daily.    [provider]  bismuth subsalicylate (KAOPECTATE) 262 MG/15ML suspension Take 10 mLs by mouth 3 (three) times daily as needed.     [provider]  carbamide peroxide (DEBROX) 6.5 % OTIC solution 5 drops 5 (five) times daily.     [provider]  dextromethorphan-guaiFENesin (ROBITUSSIN-DM) 10-100 MG/5ML liquid Take 10 mLs by mouth every 4 (four) hours as needed for cough.    [provider]  Magnesium Hydroxide (MILK OF MAGNESIA PO) Take 30 mLs by mouth daily as needed.     [provider]  metoprolol succinate (TOPROL-XL) 50 MG 24 hr tablet Take 1 tablet (50 mg total) by mouth daily with supper. Take with or immediately following a meal. 09/28/17 12/27/17  Minna Merritts, MD  Multiple Vitamins-Minerals (CENTRUM SILVER PO) Take 1 tablet by mouth daily.     [provider]  nystatin (MYCOSTATIN/NYSTOP) powder Apply 1 Bottle topically 2 (two) times daily.  [provider]  ramipril (ALTACE) 10 MG capsule TAKE ONE CAPSULE BY MOUTH ONCE DAILY 10/07/16   Minna Merritts, MD  Simethicone (MYLANTA GAS PO) Take 30 mLs by mouth every 4 (four) hours as needed.     [provider]  timolol (TIMOPTIC) 0.5 % ophthalmic solution Place 1 drop into both eyes daily.  04/29/15   [provider]    Allergies  Allergen Reactions  . Amoxil [Amoxicillin] Anxiety  . Codeine   . Pacerone  [Amiodarone Hcl] Nausea And Vomiting  . Sulfa Drugs Cross Reactors   . Amlodipine Other  (See Comments)    Mild edema    Family History  Problem Relation Age of Onset  . Heart attack Father   . Heart failure Brother     Social History Social History   Tobacco Use  . Smoking status: Never Smoker  . Smokeless tobacco: Never Used  Substance Use Topics  . Alcohol use: Yes    Alcohol/week: 0.0 oz    Comment: occasionally wine  . Drug use: No    Review of Systems Constitutional: Negative for fever. Cardiovascular: Negative for chest pain. Respiratory: Negative for shortness of breath. Gastrointestinal: Left lower quadrant abdominal pain.  Negative for nausea vomiting or diarrhea.  Negative for black bloody stool Genitourinary: Negative for urinary compaints Musculoskeletal: Negative for musculoskeletal complaints Skin: Negative for skin complaints  Neurological: Negative for headache All other ROS negative  ____________________________________________   PHYSICAL EXAM:  VITAL SIGNS: ED Triage Vitals  Enc Vitals Group     BP 07/03/18 2210 (!) 203/103     Pulse Rate 07/03/18 2210 76     Resp 07/03/18 2210 (!) 23     Temp 07/03/18 2210 98.5 F (36.9 C)     Temp Source 07/03/18 2210 Oral     SpO2 07/03/18 2210 94 %     Weight 07/03/18 2205 150 lb (68 kg)     Height 07/03/18 2205 5\' 5"  (1.651 m)     Head Circumference --      Peak Flow --      Pain Score 07/03/18 2204 7     Pain Loc --      Pain Edu? --      Excl. in [place]? --     Constitutional: Alert and oriented. Well appearing and in no distress. Eyes: Normal exam ENT   Head: Normocephalic and atraumatic.   Mouth/Throat: Mucous membranes are moist. Cardiovascular: Normal rate, regular rhythm. No murmur Respiratory: Normal respiratory effort without tachypnea nor retractions. Breath sounds are clear  Gastrointestinal: Soft, mild suprapubic and left lower quadrant tenderness to palpation.  No rebound or guarding, no distention. Musculoskeletal: Nontender with normal range of motion in all  extremities.  Neurologic:  Normal speech and language. No gross focal neurologic deficits Skin:  Skin is warm, dry and intact.  Psychiatric: Mood and affect are normal.   ____________________________________________    EKG  EKG reviewed and interpreted by myself appears to show normal sinus rhythm at 74 bpm with a slightly widened QRS, left axis deviation, largely normal intervals with nonspecific ST changes.  ____________________________________________    ZOXWRUEAV  CT pending  ____________________________________________   INITIAL IMPRESSION / ASSESSMENT AND PLAN / ED COURSE  Pertinent labs & imaging results that were available during my care of the patient were reviewed by me and considered in my medical decision making (see chart for details).  Patient presents to the emergency department for lower abdominal pain especially in  the left lower quadrant.  Largely negative review of systems otherwise.  Patient does have mild to moderate tenderness to palpation over this area.  No rebound or guarding or distention.  Differential would include diverticulitis, colitis, hernia, urinary tract infection, pyelonephritis.  Patient's labs are reassuring, urinalysis is normal, normal white blood cell count as well as chemistry.  No LFT or lipase elevation.  Will obtain CT imaging to further evaluate.  Patient agreeable to this plan of care.  No acute distress at this time.  CT pending patient care signed out to Dr. Beather Arbour. ____________________________________________   FINAL CLINICAL IMPRESSION(S) / ED DIAGNOSES  Lower abdominal pain    Harvest Dark, MD 07/04/18 [phone]

## 2018-07-04 DIAGNOSIS — R6889 Other general symptoms and signs: Secondary | ICD-10-CM | POA: Diagnosis not present

## 2018-07-04 DIAGNOSIS — N83201 Unspecified ovarian cyst, right side: Secondary | ICD-10-CM | POA: Diagnosis not present

## 2018-07-04 MED ORDER — FENTANYL CITRATE (PF) 100 MCG/2ML IJ SOLN
50.0000 ug | Freq: Once | INTRAMUSCULAR | Status: AC
  Administered 2018-07-04: 50 ug via INTRAVENOUS
  Filled 2018-07-04: qty 2

## 2018-07-04 MED ORDER — FAMOTIDINE 20 MG PO TABS: 20.0000 mg | ORAL_TABLET | Freq: Two times a day (BID) | ORAL | 0 refills | Status: DC

## 2018-07-04 MED ORDER — DICYCLOMINE HCL 20 MG PO TABS: 20.0000 mg | ORAL_TABLET | Freq: Four times a day (QID) | ORAL | 0 refills | Status: DC | PRN

## 2018-07-04 MED ORDER — DICYCLOMINE HCL 20 MG PO TABS
20.0000 mg | ORAL_TABLET | Freq: Once | ORAL | Status: AC
  Administered 2018-07-04: 20 mg via ORAL
  Filled 2018-07-04: qty 1

## 2018-07-04 MED ORDER — METOPROLOL TARTRATE 50 MG PO TABS
50.0000 mg | ORAL_TABLET | Freq: Once | ORAL | Status: AC
  Administered 2018-07-04: 50 mg via ORAL
  Filled 2018-07-04: qty 1

## 2018-07-04 NOTE — ED Provider Notes (Signed)
-----------------------------------------   12:34 AM on 07/04/2018 -----------------------------------------  ED interpretation: Duodenitis, pre-existing Spigelian hernias without obstruction, right ovarian cyst  CT Abdomen/Pelvis interpreted per Dr. Gerilyn Nestle: 1. Wall thickening in the pyloric region of the stomach and duodenum  may indicate peptic ulcer disease or duodenitis. Small Spigelian  type hernias bilaterally containing small bowel, also with colon on  the left. No proximal obstruction is identified. Hernias are  enlarged since previous study.    2. 3.1 Cm diameter right ovarian cyst is enlarging since previous  study. Due to size greater than 3 cm in late postmenopausal patient,  ultrasound is suggested for further characterization.   Updated patient of CT results.  She is eager for discharge home.  I personally reviewed her MAR and notes she is not on anything for PUD or duodenitis.  Will start twice daily Pepcid.  Will refer her to surgery and GYN for outpatient follow-ups of Spigelian hernias and right ovarian cyst.  Bentyl given for abdominal discomfort.  Blood pressure improved; currently 184/91. strict return precautions given.  Patient verbalizes understanding and agrees with plan of care.   Paulette Blanch, MD 07/04/18 [phone]

## 2018-07-04 NOTE — Discharge Instructions (Signed)
1.  You may take Bentyl as needed for abdominal discomfort. 2.  Start Pepcid 20 mg twice daily. 3.  Return to the ER for worsening symptoms, persistent vomiting, difficulty breathing or other concerns.

## 2018-07-06 ENCOUNTER — Ambulatory Visit: Payer: Medicare Other | Admitting: Internal Medicine

## 2018-07-06 VITALS — BP 115/57 | HR 63 | Temp 97.9°F | Resp 14 | Wt 139.0 lb

## 2018-07-06 DIAGNOSIS — N83201 Unspecified ovarian cyst, right side: Secondary | ICD-10-CM | POA: Diagnosis not present

## 2018-07-06 DIAGNOSIS — K2980 Duodenitis without bleeding: Secondary | ICD-10-CM

## 2018-07-06 DIAGNOSIS — R1032 Left lower quadrant pain: Secondary | ICD-10-CM | POA: Diagnosis not present

## 2018-07-14 ENCOUNTER — Ambulatory Visit: Payer: Medicare Other | Admitting: Internal Medicine

## 2018-07-14 ENCOUNTER — Encounter: Payer: Self-pay | Admitting: Internal Medicine

## 2018-07-14 DIAGNOSIS — K439 Ventral hernia without obstruction or gangrene: Secondary | ICD-10-CM | POA: Diagnosis not present

## 2018-07-14 DIAGNOSIS — N83202 Unspecified ovarian cyst, left side: Secondary | ICD-10-CM

## 2018-07-14 DIAGNOSIS — K2980 Duodenitis without bleeding: Secondary | ICD-10-CM

## 2018-07-14 DIAGNOSIS — N83201 Unspecified ovarian cyst, right side: Secondary | ICD-10-CM | POA: Diagnosis not present

## 2018-07-14 NOTE — Assessment & Plan Note (Signed)
Right side larger on recent CT scan She does not want any action on this and would not want surgery Will not do ultrasound or any other evaluation

## 2018-07-14 NOTE — Assessment & Plan Note (Signed)
Pain seems better on the pepcid Will continue Stop ASA in case that is causing some of the problem

## 2018-07-14 NOTE — Progress Notes (Signed)
Subjective:    Patient ID: Shelly Padilla, female    DOB: 1920-03-03, 84 y.o.   MRN: [id number]  HPI Visit in [place] apartment for follow up of ER visit Reviewed imaging, labs and MD evaluation Reviewed status with facility RN---some pain still and ?constipation  Was having pain in her stomach and also head Got fairly constant----prompting ER visit No clear dysphagia or heartburn She notes constipation No nausea or vomiting Appetite is okay Weight is stable Does feel better now  Current Outpatient Medications on File Prior to Visit  Medication Sig Dispense Refill  . acetaminophen (TYLENOL) 325 MG tablet Take 325-650 mg by mouth every 4 (four) hours as needed for mild pain or fever.     Marland Kitchen aspirin 81 MG tablet Take 81 mg by mouth daily.    . carbamide peroxide (DEBROX) 6.5 % OTIC solution 5 drops 5 (five) times daily.     . famotidine (PEPCID) 20 MG tablet Take 1 tablet (20 mg total) by mouth 2 (two) times daily. 60 tablet 0  . Magnesium Hydroxide (MILK OF MAGNESIA PO) Take 30 mLs by mouth daily as needed.     . Multiple Vitamins-Minerals (CENTRUM SILVER PO) Take 1 tablet by mouth daily.     Marland Kitchen nystatin (MYCOSTATIN/NYSTOP) powder Apply 1 Bottle topically 2 (two) times daily.    . ramipril (ALTACE) 10 MG capsule TAKE ONE CAPSULE BY MOUTH ONCE DAILY 90 capsule 3  . Simethicone (MYLANTA GAS PO) Take 30 mLs by mouth every 4 (four) hours as needed.     . timolol (TIMOPTIC) 0.5 % ophthalmic solution Place 1 drop into both eyes daily.     . metoprolol succinate (TOPROL-XL) 50 MG 24 hr tablet Take 1 tablet (50 mg total) by mouth daily with supper. Take with or immediately following a meal. 30 tablet 6   No current facility-administered medications on file prior to visit.     Allergies  Allergen Reactions  . Amoxil [Amoxicillin] Anxiety  . Codeine   . Pacerone  [Amiodarone Hcl] Nausea And Vomiting  . Sulfa Drugs Cross Reactors   . Amlodipine Other (See Comments)    Mild edema    Past  Medical History:  Diagnosis Date  . Atrial fibrillation ([place])   . Brain tumor ([place])    meningioma  . Glaucoma   . H/O paroxysmal supraventricular tachycardia    documented by Holter Monitor  . Hemorrhoids   . Hiatal hernia   . History of ovarian cyst   . HTN (hypertension)   . Insomnia   . Palpitations   . Thrombocytosis ([place])     Past Surgical History:  Procedure Laterality Date  . CATARACT EXTRACTION     left eye  . Leg vein stripping  1970  . TONSILLECTOMY  1929    Family History  Problem Relation Age of Onset  . Heart attack Father   . Heart failure Brother     Social History   Socioeconomic History  . Marital status: Widowed    Spouse name: Not on file  . Number of children: 1  . Years of education: Not on file  . Highest education level: Not on file  Occupational History  . Occupation: retired    Comment: Transport planner  . Financial resource strain: Not on file  . Food insecurity:    Worry: Not on file    Inability: Not on file  . Transportation needs:    Medical: Not on file  Non-medical: Not on file  Tobacco Use  . Smoking status: Never Smoker  . Smokeless tobacco: Never Used  Substance and Sexual Activity  . Alcohol use: Yes    Alcohol/week: 0.0 oz    Comment: occasionally wine  . Drug use: No  . Sexual activity: Not on file  Lifestyle  . Physical activity:    Days per week: Not on file    Minutes per session: Not on file  . Stress: Not on file  Relationships  . Social connections:    Talks on phone: Not on file    Gets together: Not on file    Attends religious service: Not on file    Active member of club or organization: Not on file    Attends meetings of clubs or organizations: Not on file    Relationship status: Not on file  . Intimate partner violence:    Fear of current or ex partner: Not on file    Emotionally abused: Not on file    Physically abused: Not on file    Forced sexual activity: Not on file  Other  Topics Concern  . Not on file  Social History Narrative   Primary care giver of husband with Alzheimer's--now in memory care      Has living will   Son is health care POA   Has DNR   No tube feeds if cognitively unaware   Review of Systems Noticing some more memory problems and word finding problems Feels a little tired Sleeps okay mostly ---occasionally needs the medication (melatonin 5mg )    Objective:   Physical Exam  Constitutional: She appears well-developed. No distress.  Cardiovascular: Normal rate, regular rhythm and normal heart sounds. Exam reveals no gallop.  No murmur heard. Respiratory: Effort normal and breath sounds normal. No respiratory distress. She has no wheezes. She has no rales.  GI: Soft. Bowel sounds are normal. She exhibits no distension. There is no rebound and no guarding.  Very slight tenderness along rectus border on left  Musculoskeletal: She exhibits no edema.           Assessment & Plan:

## 2018-07-14 NOTE — Assessment & Plan Note (Signed)
This is also more prominent on recent CT scan This could have been part of the pain she had---but no apparent strangulation now Doubt this is a big issue and she doesn't want further evaluation or treatment Would only address if symptoms of strangulation Will add senna to the miralax to avoid constipation

## 2018-07-20 ENCOUNTER — Encounter: Payer: Self-pay | Admitting: Internal Medicine

## 2018-07-20 NOTE — Progress Notes (Signed)
Subjective:    Patient ID: Shelly Padilla, female    DOB: 24-Feb-1920, 84 y.o.   MRN: [id number]  HPI  Asked to see resident in [address].  Went to ER 7/21 with c/o left lower abdominal pain. She described the pain as sharp. The pain is intermittent. She denies urinary, vaginal or GI complaints. Her BP upon arrival was 203/103. Labs were unremarkable. CT scan showed:  IMPRESSION: 1. Wall thickening in the pyloric region of the stomach and duodenum may indicate peptic ulcer disease or duodenitis. Small Spigelian type hernias bilaterally containing small bowel, also with colon on the left. No proximal obstruction is identified. Hernias are enlarged since previous study.  2. 3.1 Cm diameter right ovarian cyst is enlarging since previous study. Due to size greater than 3 cm in late postmenopausal patient, ultrasound is suggested for further characterization.  She was started on Pepcid for possible gastritis/duodenitis. She was referred to GYN surgery re: hernias and ovarian cyst. Blood pressure medications were not changed.  Since discharge, she reports significant improvement in pain. She and her daughter have concerns about whether or not they should really follow up with GYN re: the hernias and ovarian cyst.  Review of Systems      Past Medical History:  Diagnosis Date  . Atrial fibrillation ([place])   . Brain tumor ([place])    meningioma  . Glaucoma   . H/O paroxysmal supraventricular tachycardia    documented by Holter Monitor  . Hemorrhoids   . Hiatal hernia   . History of ovarian cyst   . HTN (hypertension)   . Insomnia   . Palpitations   . Thrombocytosis ([place])     Current Outpatient Medications  Medication Sig Dispense Refill  . acetaminophen (TYLENOL) 325 MG tablet Take 325-650 mg by mouth every 4 (four) hours as needed for mild pain or fever.     . famotidine (PEPCID) 20 MG tablet Take 1 tablet (20 mg total) by mouth 2 (two) times daily. 60 tablet 0  . Melatonin 5 MG  CAPS Take 5 mg by mouth at bedtime as needed.    . Multiple Vitamins-Minerals (CENTRUM SILVER PO) Take 1 tablet by mouth daily.     Marland Kitchen nystatin (MYCOSTATIN/NYSTOP) powder Apply 1 Bottle topically 2 (two) times daily.    . polyethylene glycol (MIRALAX / GLYCOLAX) packet Take 17 g by mouth daily.    . ramipril (ALTACE) 10 MG capsule TAKE ONE CAPSULE BY MOUTH ONCE DAILY 90 capsule 3  . sennosides-docusate sodium (SENOKOT-S) 8.6-50 MG tablet Take 2 tablets by mouth daily.    . timolol (TIMOPTIC) 0.5 % ophthalmic solution Place 1 drop into both eyes daily.     . metoprolol succinate (TOPROL-XL) 50 MG 24 hr tablet Take 1 tablet (50 mg total) by mouth daily with supper. Take with or immediately following a meal. 30 tablet 6   No current facility-administered medications for this visit.     Allergies  Allergen Reactions  . Amoxil [Amoxicillin] Anxiety  . Codeine   . Pacerone  [Amiodarone Hcl] Nausea And Vomiting  . Sulfa Drugs Cross Reactors   . Amlodipine Other (See Comments)    Mild edema    Family History  Problem Relation Age of Onset  . Heart attack Father   . Heart failure Brother     Social History   Socioeconomic History  . Marital status: Widowed    Spouse name: Not on file  . Number of children: 1  . Years of  education: Not on file  . Highest education level: Not on file  Occupational History  . Occupation: retired    Comment: Transport planner  . Financial resource strain: Not on file  . Food insecurity:    Worry: Not on file    Inability: Not on file  . Transportation needs:    Medical: Not on file    Non-medical: Not on file  Tobacco Use  . Smoking status: Never Smoker  . Smokeless tobacco: Never Used  Substance and Sexual Activity  . Alcohol use: Yes    Alcohol/week: 0.0 oz    Comment: occasionally wine  . Drug use: No  . Sexual activity: Not on file  Lifestyle  . Physical activity:    Days per week: Not on file    Minutes per session: Not on file   . Stress: Not on file  Relationships  . Social connections:    Talks on phone: Not on file    Gets together: Not on file    Attends religious service: Not on file    Active member of club or organization: Not on file    Attends meetings of clubs or organizations: Not on file    Relationship status: Not on file  . Intimate partner violence:    Fear of current or ex partner: Not on file    Emotionally abused: Not on file    Physically abused: Not on file    Forced sexual activity: Not on file  Other Topics Concern  . Not on file  Social History Narrative   Primary care giver of husband with Alzheimer's--now in memory care      Has living will   Son is health care POA   Has DNR   No tube feeds if cognitively unaware     Constitutional: Denies fever, malaise, fatigue, headache or abrupt weight changes.  Respiratory: Denies difficulty breathing, shortness of breath, cough or sputum production.   Cardiovascular: Denies chest pain, chest tightness, palpitations or swelling in the hands or feet.  Gastrointestinal: Pt reports abdominal pain. Denies bloating, constipation, diarrhea or blood in the stool.  GU: Denies urgency, frequency, pain with urination, burning sensation, blood in urine, odor or discharge.  No other specific complaints in a complete review of systems (except as listed in HPI above).  Objective:   Physical Exam   BP (!) 115/57   Pulse 63   Temp 97.9 F (36.6 C)   Resp 14   Wt 139 lb (63 kg)   BMI 23.13 kg/m  Wt Readings from Last 3 Encounters:  07/20/18 139 lb (63 kg)  07/03/18 150 lb (68 kg)  06/18/18 153 lb (69.4 kg)    General: Appears her stated age, well developed, well nourished in NAD. Skin: Warm, dry and intact. No rashes noted. Cardiovascular: Normal rate and rhythm. S1,S2 noted.  No murmur, rubs or gallops noted.  Pulmonary/Chest: Normal effort and positive vesicular breath sounds. No respiratory distress. No wheezes, rales or ronchi noted.    Abdomen: Soft and mildly tender in the LLQ. Hypoactive bowel sounds.  Neurological: Alert and oriented.  Psychiatric: Mood and affect normal. Behavior is normal. Judgment and thought content normal.    BMET    Component Value Date/Time   NA 136 07/03/2018 2212   NA 139 01/08/2016 1210   NA 141 08/03/2014 0455   K 4.1 07/03/2018 2212   K 3.9 08/03/2014 0455   CL 99 07/03/2018 2212   CL 103 08/03/2014  0455   CO2 30 07/03/2018 2212   CO2 31 08/03/2014 0455   GLUCOSE 115 (H) 07/03/2018 2212   GLUCOSE 80 08/03/2014 0455   BUN 15 07/03/2018 2212   BUN 17 01/08/2016 1210   BUN 12 08/03/2014 0455   CREATININE 0.59 07/03/2018 2212   CREATININE 0.57 (L) 08/03/2014 0455   CALCIUM 9.0 07/03/2018 2212   CALCIUM 8.0 (L) 08/03/2014 0455   GFRNONAA >60 07/03/2018 2212   GFRNONAA >60 08/03/2014 0455   GFRAA >60 07/03/2018 2212   GFRAA >60 08/03/2014 0455    Lipid Panel     Component Value Date/Time   CHOL 138 11/27/2017 0607   CHOL 120 08/03/2014 0455   TRIG 67 11/27/2017 0607   TRIG 64 08/03/2014 0455   HDL 48 11/27/2017 0607   HDL 48 08/03/2014 0455   CHOLHDL 2.9 11/27/2017 0607   VLDL 13 11/27/2017 0607   VLDL 13 08/03/2014 0455   LDLCALC 77 11/27/2017 0607   LDLCALC 59 08/03/2014 0455    CBC    Component Value Date/Time   WBC 10.1 07/03/2018 2212   RBC 4.96 07/03/2018 2212   HGB 15.2 07/03/2018 2212   HGB 13.3 08/03/2014 0455   HCT 43.7 07/03/2018 2212   HCT 38.5 08/03/2014 0455   PLT 686 (H) 07/03/2018 2212   PLT 467 (H) 08/03/2014 0455   MCV 88.0 07/03/2018 2212   MCV 90 08/03/2014 0455   MCH 30.7 07/03/2018 2212   MCHC 34.8 07/03/2018 2212   RDW 12.8 07/03/2018 2212   RDW 13.2 08/03/2014 0455   LYMPHSABS 1.0 11/26/2017 1633   LYMPHSABS 1.4 08/03/2014 0455   MONOABS 1.2 (H) 11/26/2017 1633   MONOABS 1.1 (H) 08/03/2014 0455   EOSABS 0.1 11/26/2017 1633   EOSABS 0.1 08/03/2014 0455   BASOSABS 0.1 11/26/2017 1633   BASOSABS 0.1 08/03/2014 0455    Hgb  A1C Lab Results  Component Value Date   HGBA1C 5.5 11/27/2017           Assessment & Plan:   ER Follow Up for LLQ Pain, Duodenitis:  ER notes, labs and imaging reviewed Continue Pepcid Discussed holding off on GYN referral at this time, Luellen Pucker will discuss with daughter Continue current meds  Will reassess as needed Webb Silversmith, NP

## 2018-07-20 NOTE — Patient Instructions (Signed)
Duodenitis °Duodenitis is inflammation of the lining of the first part of the small intestine (duodenum). It is commonly caused by a bacterial infection, which may also lead to open sores (ulcers) in the intestine. °Duodenitis may develop suddenly and last for a short time (acute) or it may develop gradually and last for months or years (chronic). °What are the causes? °The most common cause of duodenitis is an infection from H. pylori bacteria. Other causes of this condition include: °· Long-term use of NSAIDs. °· Excessive use of alcohol. °· An infection of the small intestine (giardiasis). °· Crohn’s disease. °· Certain diseases of the immune system. °· Certain treatments for cancer. ° °What increases the risk? ° °The following factors may make you more likely to develop duodenitis: °· Smoking cigarettes. °· Drinking alcohol. °· Having a family history of duodenitis. °· Taking NSAIDs. °· Eating a high-fat diet. ° °What are the signs or symptoms? °Symptoms of this condition may include: °· Gnawing or burning pain in the upper center of the abdomen (epigastric pain). This may get worse when the stomach is empty and may get better after eating. °· Abdominal cramps. °· Nausea and vomiting. °· Bloody vomit. °· Stools that are bloody, dark, or look like tar. °· Diarrhea. °· Weight loss. °· Fatigue. ° °How is this diagnosed? °This condition may be diagnosed based on your medical history and a physical exam. You may also have tests, such as: °· Blood tests. °· Stool tests. °· A test that checks the gases in your breath. °· An X-ray that is done after you swallow a liquid (barium) that makes your digestive tract easier to see. °· Endoscopy. This is an exam of the duodenum that is done by putting a thin tube with a tiny camera on the end down your throat (endoscopy). A sample of tissue from your duodenum (biopsy) may be removed with the endoscope and examined under a microscope for signs of inflammation and  infection. ° °How is this treated? °Treatment depends on the cause of your condition. Treatment may include: °· Antibiotic medicine to treat H. pylori infection. °· Stopping your intake of NSAIDs. °· Medicine to reduce stomach acids. °· Medicine to treat Crohn’s disease. °· Surgery to treat severe inflammation that causes scarring or severe bleeding. ° °Follow these instructions at home: °Medicines °· Take over-the-counter and prescription medicines only as told by your health care provider. °· If you were prescribed antibiotic medicine, take it as told by your health care provider. Do not stop taking the antibiotic even if you start to feel better. °Eating and drinking ° °· Eat small, frequent meals. °· Do not drink alcohol. °· Drink enough water to keep your urine clear or pale yellow. °· Follow instructions from your health care provider about eating or drinking restrictions. You may be asked to avoid: °? Caffeinated drinks. °? Chocolate. °? Peppermint or mint-flavored food or drinks. °? Garlic. °? Onions. °? Spicy foods. °? Citrus fruits. °? Tomato-based foods. °? Fatty foods. °? Fried foods. °Contact a health care provider if: °· You have a fever. °· Your symptoms come back, get worse, or do not get better with treatment. °Get help right away if: °· You vomit blood. °· You have severe abdominal pain. °· Your abdomen swells and is painful. °· You have a lot of blood in your stool. °· You feel dizzy or light-headed. °This information is not intended to replace advice given to you by your health care provider. Make sure you discuss   any questions you have with your health care provider. °Document Released: 03/27/2013 Document Revised: 05/07/2016 Document Reviewed: 10/03/2015 °Elsevier Interactive Patient Education © 2018 [employer] ° °

## 2018-09-08 ENCOUNTER — Ambulatory Visit: Payer: Medicare Other | Admitting: Internal Medicine

## 2018-09-08 ENCOUNTER — Encounter: Payer: Self-pay | Admitting: Internal Medicine

## 2018-09-08 VITALS — BP 144/84 | HR 72 | Temp 98.1°F | Resp 18 | Wt 151.0 lb

## 2018-09-08 DIAGNOSIS — F321 Major depressive disorder, single episode, moderate: Secondary | ICD-10-CM

## 2018-09-08 NOTE — Progress Notes (Signed)
Subjective:    Patient ID: Shelly Padilla, female    DOB: 1920/10/23, 84 y.o.   MRN: [id number]  HPI Visit in [place] [address] to review issues with depression Reviewed status with Luellen Pucker RN and read detailed notes about her voicing thoughts about dying  Has voiced concerns about her memory Doesn't feel right for the past month Troubles focusing Seeing "people walking by me, that aren't there" Generally sleeps okay---uses the melatonin rarely  "I get mad at myself" Has mentioned wanting a pill from me to "end it all" Is thinking about dying---but denies true suicidal ideation Having trouble thinking of anything she enjoys right now----and has been preferring to stay in room watching Westerns Feels tired a lot  Having more low back pain Some shooting abdominal pains after eating  Current Outpatient Medications on File Prior to Visit  Medication Sig Dispense Refill  . acetaminophen (TYLENOL) 325 MG tablet Take 325-650 mg by mouth every 4 (four) hours as needed for mild pain or fever.     . famotidine (PEPCID) 20 MG tablet Take 1 tablet (20 mg total) by mouth 2 (two) times daily. 60 tablet 0  . Melatonin 5 MG CAPS Take 5 mg by mouth at bedtime as needed.    . Multiple Vitamins-Minerals (CENTRUM SILVER PO) Take 1 tablet by mouth daily.     Marland Kitchen nystatin (MYCOSTATIN/NYSTOP) powder Apply 1 Bottle topically 2 (two) times daily.    . polyethylene glycol (MIRALAX / GLYCOLAX) packet Take 17 g by mouth daily.    . ramipril (ALTACE) 10 MG capsule TAKE ONE CAPSULE BY MOUTH ONCE DAILY 90 capsule 3  . sennosides-docusate sodium (SENOKOT-S) 8.6-50 MG tablet Take 2 tablets by mouth daily.    . timolol (TIMOPTIC) 0.5 % ophthalmic solution Place 1 drop into both eyes daily.     . metoprolol succinate (TOPROL-XL) 50 MG 24 hr tablet Take 1 tablet (50 mg total) by mouth daily with supper. Take with or immediately following a meal. 30 tablet 6   No current facility-administered medications on file prior to  visit.     Allergies  Allergen Reactions  . Amoxil [Amoxicillin] Anxiety  . Codeine   . Pacerone  [Amiodarone Hcl] Nausea And Vomiting  . Sulfa Drugs Cross Reactors   . Amlodipine Other (See Comments)    Mild edema    Past Medical History:  Diagnosis Date  . Atrial fibrillation ([place])   . Brain tumor ([place])    meningioma  . Glaucoma   . H/O paroxysmal supraventricular tachycardia    documented by Holter Monitor  . Hemorrhoids   . Hiatal hernia   . History of ovarian cyst   . HTN (hypertension)   . Insomnia   . Palpitations   . Thrombocytosis ([place])     Past Surgical History:  Procedure Laterality Date  . CATARACT EXTRACTION     left eye  . Leg vein stripping  1970  . TONSILLECTOMY  1929    Family History  Problem Relation Age of Onset  . Heart attack Father   . Heart failure Brother     Social History   Socioeconomic History  . Marital status: Widowed    Spouse name: Not on file  . Number of children: 1  . Years of education: Not on file  . Highest education level: Not on file  Occupational History  . Occupation: retired    Comment: Transport planner  . Financial resource strain: Not on file  . Food  insecurity:    Worry: Not on file    Inability: Not on file  . Transportation needs:    Medical: Not on file    Non-medical: Not on file  Tobacco Use  . Smoking status: Never Smoker  . Smokeless tobacco: Never Used  Substance and Sexual Activity  . Alcohol use: Yes    Alcohol/week: 0.0 standard drinks    Comment: occasionally wine  . Drug use: No  . Sexual activity: Not on file  Lifestyle  . Physical activity:    Days per week: Not on file    Minutes per session: Not on file  . Stress: Not on file  Relationships  . Social connections:    Talks on phone: Not on file    Gets together: Not on file    Attends religious service: Not on file    Active member of club or organization: Not on file    Attends meetings of clubs or organizations:  Not on file    Relationship status: Not on file  . Intimate partner violence:    Fear of current or ex partner: Not on file    Emotionally abused: Not on file    Physically abused: Not on file    Forced sexual activity: Not on file  Other Topics Concern  . Not on file  Social History Narrative   Primary care giver of husband with Alzheimer's--now in memory care      Has living will   Son is health care POA   Has DNR   No tube feeds if cognitively unaware   Review of Systems Appetite is okay Weight is back up to baseline    Objective:   Physical Exam  Constitutional: She appears well-developed. No distress.  Psychiatric:  Psychomotor retardation--mild Mild depressed mood and constricted affect           Assessment & Plan:

## 2018-09-08 NOTE — Assessment & Plan Note (Signed)
Sadness, social isolation, visual hallucinations, decreased focus, anhedonia for the past month Discussed trying medication and she agrees Will review with the RN and have her closely monitor

## 2018-11-03 DIAGNOSIS — M545 Low back pain: Secondary | ICD-10-CM | POA: Diagnosis not present

## 2018-11-04 ENCOUNTER — Ambulatory Visit: Payer: Medicare Other | Admitting: Internal Medicine

## 2018-11-04 VITALS — BP 150/92 | HR 75 | Temp 98.1°F | Resp 20 | Wt 147.6 lb

## 2018-11-04 DIAGNOSIS — W19XXXA Unspecified fall, initial encounter: Secondary | ICD-10-CM | POA: Diagnosis not present

## 2018-11-04 DIAGNOSIS — M545 Low back pain, unspecified: Secondary | ICD-10-CM

## 2018-11-05 ENCOUNTER — Encounter: Payer: Self-pay | Admitting: Internal Medicine

## 2018-11-05 NOTE — Progress Notes (Signed)
Subjective:    Patient ID: Shelly Padilla, female    DOB: December 19, 1919, 84 y.o.   MRN: [id number]  HPI  Asked to see resident in [address] C/o right low back pain s/p fall one week ago She describes the pain as achy and sharp. Only in pain when she gets from a laying/sitting to a standing position. Pain does not radiate. She denies numbness, tingling or weakness in her leg. There was never any bruising She has taken Tylenol with some relief. Xray lumbar spine negative for acute fracture Still ambulating with rolling walker No c/o hip pain  Review of Systems  Past Medical History:  Diagnosis Date  . Atrial fibrillation ([place])   . Brain tumor ([place])    meningioma  . Glaucoma   . H/O paroxysmal supraventricular tachycardia    documented by Holter Monitor  . Hemorrhoids   . Hiatal hernia   . History of ovarian cyst   . HTN (hypertension)   . Insomnia   . Palpitations   . Thrombocytosis ([place])     Current Outpatient Medications  Medication Sig Dispense Refill  . acetaminophen (TYLENOL) 325 MG tablet Take 325-650 mg by mouth every 4 (four) hours as needed for mild pain or fever.     . famotidine (PEPCID) 20 MG tablet Take 1 tablet (20 mg total) by mouth 2 (two) times daily. 60 tablet 0  . Melatonin 5 MG CAPS Take 5 mg by mouth at bedtime as needed.    . metoprolol succinate (TOPROL-XL) 50 MG 24 hr tablet Take 1 tablet (50 mg total) by mouth daily with supper. Take with or immediately following a meal. 30 tablet 6  . Multiple Vitamins-Minerals (CENTRUM SILVER PO) Take 1 tablet by mouth daily.     Marland Kitchen nystatin (MYCOSTATIN/NYSTOP) powder Apply 1 Bottle topically 2 (two) times daily.    . polyethylene glycol (MIRALAX / GLYCOLAX) packet Take 17 g by mouth daily.    . ramipril (ALTACE) 10 MG capsule TAKE ONE CAPSULE BY MOUTH ONCE DAILY 90 capsule 3  . sennosides-docusate sodium (SENOKOT-S) 8.6-50 MG tablet Take 2 tablets by mouth daily.    . sertraline (ZOLOFT) 50 MG tablet Take 50 mg by  mouth daily.    . timolol (TIMOPTIC) 0.5 % ophthalmic solution Place 1 drop into both eyes daily.      No current facility-administered medications for this visit.     Allergies  Allergen Reactions  . Amoxil [Amoxicillin] Anxiety  . Codeine   . Pacerone  [Amiodarone Hcl] Nausea And Vomiting  . Sulfa Drugs Cross Reactors   . Amlodipine Other (See Comments)    Mild edema    Family History  Problem Relation Age of Onset  . Heart attack Father   . Heart failure Brother     Social History   Socioeconomic History  . Marital status: Widowed    Spouse name: Not on file  . Number of children: 1  . Years of education: Not on file  . Highest education level: Not on file  Occupational History  . Occupation: retired    Comment: Transport planner  . Financial resource strain: Not on file  . Food insecurity:    Worry: Not on file    Inability: Not on file  . Transportation needs:    Medical: Not on file    Non-medical: Not on file  Tobacco Use  . Smoking status: Never Smoker  . Smokeless tobacco: Never Used  Substance and Sexual Activity  .  Alcohol use: Yes    Alcohol/week: 0.0 standard drinks    Comment: occasionally wine  . Drug use: No  . Sexual activity: Not on file  Lifestyle  . Physical activity:    Days per week: Not on file    Minutes per session: Not on file  . Stress: Not on file  Relationships  . Social connections:    Talks on phone: Not on file    Gets together: Not on file    Attends religious service: Not on file    Active member of club or organization: Not on file    Attends meetings of clubs or organizations: Not on file    Relationship status: Not on file  . Intimate partner violence:    Fear of current or ex partner: Not on file    Emotionally abused: Not on file    Physically abused: Not on file    Forced sexual activity: Not on file  Other Topics Concern  . Not on file  Social History Narrative   Primary care giver of husband with  Alzheimer's--now in memory care      Has living will   Son is health care POA   Has DNR   No tube feeds if cognitively unaware     Constitutional: Denies fever, malaise, fatigue, headache or abrupt weight changes.  Gastrointestinal: Denies loss of bowel control. GU: Denies loss of bladder control. Musculoskeletal: Pt reports right low back pain. Denies joint pain and swelling.  Skin: Denies bruising or abrasion.    No other specific complaints in a complete review of systems (except as listed in HPI above).     Objective:   Physical Exam   BP (!) 150/92   Pulse 75   Temp 98.1 F (36.7 C)   Resp 20   Wt 147 lb 9.6 oz (67 kg)   BMI 24.56 kg/m  Wt Readings from Last 3 Encounters:  11/05/18 147 lb 9.6 oz (67 kg)  09/08/18 151 lb (68.5 kg)  07/20/18 139 lb (63 kg)    General: Appears her stated age, well developed, well nourished in NAD. Skin: Warm, dry and intact. No rashes, bruising or abrasion noted. Abdomen: Soft and nontender. Normal bowel sounds. Musculoskeletal: No bony tenderness noted over the spine. No pain with palpation over the right hip. Stiffness noted with going from sitting to standing position. Gait slow and steady with use of rolling walker.   BMET    Component Value Date/Time   NA 136 07/03/2018 2212   NA 139 01/08/2016 1210   NA 141 08/03/2014 0455   K 4.1 07/03/2018 2212   K 3.9 08/03/2014 0455   CL 99 07/03/2018 2212   CL 103 08/03/2014 0455   CO2 30 07/03/2018 2212   CO2 31 08/03/2014 0455   GLUCOSE 115 (H) 07/03/2018 2212   GLUCOSE 80 08/03/2014 0455   BUN 15 07/03/2018 2212   BUN 17 01/08/2016 1210   BUN 12 08/03/2014 0455   CREATININE 0.59 07/03/2018 2212   CREATININE 0.57 (L) 08/03/2014 0455   CALCIUM 9.0 07/03/2018 2212   CALCIUM 8.0 (L) 08/03/2014 0455   GFRNONAA >60 07/03/2018 2212   GFRNONAA >60 08/03/2014 0455   GFRAA >60 07/03/2018 2212   GFRAA >60 08/03/2014 0455    Lipid Panel     Component Value Date/Time   CHOL  138 11/27/2017 0607   CHOL 120 08/03/2014 0455   TRIG 67 11/27/2017 0607   TRIG 64 08/03/2014 0455  HDL 48 11/27/2017 0607   HDL 48 08/03/2014 0455   CHOLHDL 2.9 11/27/2017 0607   VLDL 13 11/27/2017 0607   VLDL 13 08/03/2014 0455   LDLCALC 77 11/27/2017 0607   LDLCALC 59 08/03/2014 0455    CBC    Component Value Date/Time   WBC 10.1 07/03/2018 2212   RBC 4.96 07/03/2018 2212   HGB 15.2 07/03/2018 2212   HGB 13.3 08/03/2014 0455   HCT 43.7 07/03/2018 2212   HCT 38.5 08/03/2014 0455   PLT 686 (H) 07/03/2018 2212   PLT 467 (H) 08/03/2014 0455   MCV 88.0 07/03/2018 2212   MCV 90 08/03/2014 0455   MCH 30.7 07/03/2018 2212   MCHC 34.8 07/03/2018 2212   RDW 12.8 07/03/2018 2212   RDW 13.2 08/03/2014 0455   LYMPHSABS 1.0 11/26/2017 1633   LYMPHSABS 1.4 08/03/2014 0455   MONOABS 1.2 (H) 11/26/2017 1633   MONOABS 1.1 (H) 08/03/2014 0455   EOSABS 0.1 11/26/2017 1633   EOSABS 0.1 08/03/2014 0455   BASOSABS 0.1 11/26/2017 1633   BASOSABS 0.1 08/03/2014 0455    Hgb A1C Lab Results  Component Value Date   HGBA1C 5.5 11/27/2017           Assessment & Plan:   Right Low Back Pain:  Xray negative Likely muscular Will schedule Tylenol ER 650 mg BID x 1 week Consider PT if no improvement  Will reassess as needed Webb Silversmith, NP

## 2018-11-05 NOTE — Patient Instructions (Signed)

## 2018-11-21 DIAGNOSIS — R4189 Other symptoms and signs involving cognitive functions and awareness: Secondary | ICD-10-CM | POA: Diagnosis not present

## 2018-11-21 DIAGNOSIS — R278 Other lack of coordination: Secondary | ICD-10-CM | POA: Diagnosis not present

## 2018-11-21 DIAGNOSIS — R262 Difficulty in walking, not elsewhere classified: Secondary | ICD-10-CM | POA: Diagnosis not present

## 2018-11-21 DIAGNOSIS — F329 Major depressive disorder, single episode, unspecified: Secondary | ICD-10-CM | POA: Diagnosis not present

## 2018-11-21 DIAGNOSIS — M6281 Muscle weakness (generalized): Secondary | ICD-10-CM | POA: Diagnosis not present

## 2018-11-21 DIAGNOSIS — Z741 Need for assistance with personal care: Secondary | ICD-10-CM | POA: Diagnosis not present

## 2018-11-22 DIAGNOSIS — F329 Major depressive disorder, single episode, unspecified: Secondary | ICD-10-CM | POA: Diagnosis not present

## 2018-11-22 DIAGNOSIS — Z741 Need for assistance with personal care: Secondary | ICD-10-CM | POA: Diagnosis not present

## 2018-11-22 DIAGNOSIS — R278 Other lack of coordination: Secondary | ICD-10-CM | POA: Diagnosis not present

## 2018-11-22 DIAGNOSIS — R296 Repeated falls: Secondary | ICD-10-CM | POA: Diagnosis not present

## 2018-11-22 DIAGNOSIS — R262 Difficulty in walking, not elsewhere classified: Secondary | ICD-10-CM | POA: Diagnosis not present

## 2018-11-22 DIAGNOSIS — R4189 Other symptoms and signs involving cognitive functions and awareness: Secondary | ICD-10-CM | POA: Diagnosis not present

## 2018-11-22 DIAGNOSIS — M6281 Muscle weakness (generalized): Secondary | ICD-10-CM | POA: Diagnosis not present

## 2018-11-22 DIAGNOSIS — F321 Major depressive disorder, single episode, moderate: Secondary | ICD-10-CM | POA: Diagnosis not present

## 2018-11-23 DIAGNOSIS — Z741 Need for assistance with personal care: Secondary | ICD-10-CM | POA: Diagnosis not present

## 2018-11-23 DIAGNOSIS — R278 Other lack of coordination: Secondary | ICD-10-CM | POA: Diagnosis not present

## 2018-11-23 DIAGNOSIS — M6281 Muscle weakness (generalized): Secondary | ICD-10-CM | POA: Diagnosis not present

## 2018-11-23 DIAGNOSIS — R262 Difficulty in walking, not elsewhere classified: Secondary | ICD-10-CM | POA: Diagnosis not present

## 2018-11-23 DIAGNOSIS — F329 Major depressive disorder, single episode, unspecified: Secondary | ICD-10-CM | POA: Diagnosis not present

## 2018-11-23 DIAGNOSIS — R4189 Other symptoms and signs involving cognitive functions and awareness: Secondary | ICD-10-CM | POA: Diagnosis not present

## 2018-11-24 DIAGNOSIS — R4189 Other symptoms and signs involving cognitive functions and awareness: Secondary | ICD-10-CM | POA: Diagnosis not present

## 2018-11-24 DIAGNOSIS — Z741 Need for assistance with personal care: Secondary | ICD-10-CM | POA: Diagnosis not present

## 2018-11-24 DIAGNOSIS — F329 Major depressive disorder, single episode, unspecified: Secondary | ICD-10-CM | POA: Diagnosis not present

## 2018-11-24 DIAGNOSIS — R278 Other lack of coordination: Secondary | ICD-10-CM | POA: Diagnosis not present

## 2018-11-24 DIAGNOSIS — R262 Difficulty in walking, not elsewhere classified: Secondary | ICD-10-CM | POA: Diagnosis not present

## 2018-11-24 DIAGNOSIS — M6281 Muscle weakness (generalized): Secondary | ICD-10-CM | POA: Diagnosis not present

## 2018-11-25 DIAGNOSIS — R278 Other lack of coordination: Secondary | ICD-10-CM | POA: Diagnosis not present

## 2018-11-25 DIAGNOSIS — M6281 Muscle weakness (generalized): Secondary | ICD-10-CM | POA: Diagnosis not present

## 2018-11-25 DIAGNOSIS — R262 Difficulty in walking, not elsewhere classified: Secondary | ICD-10-CM | POA: Diagnosis not present

## 2018-11-25 DIAGNOSIS — R4189 Other symptoms and signs involving cognitive functions and awareness: Secondary | ICD-10-CM | POA: Diagnosis not present

## 2018-11-25 DIAGNOSIS — Z741 Need for assistance with personal care: Secondary | ICD-10-CM | POA: Diagnosis not present

## 2018-11-25 DIAGNOSIS — F329 Major depressive disorder, single episode, unspecified: Secondary | ICD-10-CM | POA: Diagnosis not present

## 2018-11-26 ENCOUNTER — Emergency Department: Payer: Medicare Other

## 2018-11-26 ENCOUNTER — Encounter: Payer: Self-pay | Admitting: Emergency Medicine

## 2018-11-26 ENCOUNTER — Other Ambulatory Visit: Payer: Self-pay

## 2018-11-26 ENCOUNTER — Emergency Department
Admission: EM | Admit: 2018-11-26 | Discharge: 2018-11-26 | Disposition: A | Discharge status: Home / Self Care | Payer: Medicare Other | Source: Home / Self Care | Attending: Emergency Medicine | Admitting: Emergency Medicine

## 2018-11-26 DIAGNOSIS — I4510 Unspecified right bundle-branch block: Secondary | ICD-10-CM | POA: Diagnosis not present

## 2018-11-26 DIAGNOSIS — W010XXA Fall on same level from slipping, tripping and stumbling without subsequent striking against object, initial encounter: Secondary | ICD-10-CM

## 2018-11-26 DIAGNOSIS — N83292 Other ovarian cyst, left side: Secondary | ICD-10-CM

## 2018-11-26 DIAGNOSIS — D329 Benign neoplasm of meninges, unspecified: Secondary | ICD-10-CM | POA: Diagnosis not present

## 2018-11-26 DIAGNOSIS — I10 Essential (primary) hypertension: Secondary | ICD-10-CM | POA: Insufficient documentation

## 2018-11-26 DIAGNOSIS — Z79899 Other long term (current) drug therapy: Secondary | ICD-10-CM | POA: Insufficient documentation

## 2018-11-26 DIAGNOSIS — Y998 Other external cause status: Secondary | ICD-10-CM

## 2018-11-26 DIAGNOSIS — R4182 Altered mental status, unspecified: Secondary | ICD-10-CM | POA: Diagnosis not present

## 2018-11-26 DIAGNOSIS — I2510 Atherosclerotic heart disease of native coronary artery without angina pectoris: Secondary | ICD-10-CM | POA: Insufficient documentation

## 2018-11-26 DIAGNOSIS — S7001XA Contusion of right hip, initial encounter: Secondary | ICD-10-CM | POA: Insufficient documentation

## 2018-11-26 DIAGNOSIS — R296 Repeated falls: Secondary | ICD-10-CM

## 2018-11-26 DIAGNOSIS — F329 Major depressive disorder, single episode, unspecified: Secondary | ICD-10-CM

## 2018-11-26 DIAGNOSIS — R0902 Hypoxemia: Secondary | ICD-10-CM | POA: Diagnosis not present

## 2018-11-26 DIAGNOSIS — Z8673 Personal history of transient ischemic attack (TIA), and cerebral infarction without residual deficits: Secondary | ICD-10-CM | POA: Insufficient documentation

## 2018-11-26 DIAGNOSIS — N83201 Unspecified ovarian cyst, right side: Secondary | ICD-10-CM

## 2018-11-26 DIAGNOSIS — Y9389 Activity, other specified: Secondary | ICD-10-CM

## 2018-11-26 DIAGNOSIS — F05 Delirium due to known physiological condition: Secondary | ICD-10-CM | POA: Diagnosis not present

## 2018-11-26 DIAGNOSIS — F0150 Vascular dementia without behavioral disturbance: Secondary | ICD-10-CM | POA: Diagnosis not present

## 2018-11-26 DIAGNOSIS — R404 Transient alteration of awareness: Secondary | ICD-10-CM | POA: Diagnosis not present

## 2018-11-26 DIAGNOSIS — R410 Disorientation, unspecified: Secondary | ICD-10-CM

## 2018-11-26 DIAGNOSIS — I482 Chronic atrial fibrillation, unspecified: Secondary | ICD-10-CM | POA: Diagnosis not present

## 2018-11-26 DIAGNOSIS — R109 Unspecified abdominal pain: Secondary | ICD-10-CM | POA: Diagnosis not present

## 2018-11-26 DIAGNOSIS — Z66 Do not resuscitate: Secondary | ICD-10-CM | POA: Diagnosis not present

## 2018-11-26 DIAGNOSIS — R443 Hallucinations, unspecified: Secondary | ICD-10-CM | POA: Diagnosis not present

## 2018-11-26 DIAGNOSIS — Y92122 Bedroom in nursing home as the place of occurrence of the external cause: Secondary | ICD-10-CM

## 2018-11-26 DIAGNOSIS — I6340 Cerebral infarction due to embolism of unspecified cerebral artery: Secondary | ICD-10-CM | POA: Diagnosis not present

## 2018-11-26 DIAGNOSIS — J9811 Atelectasis: Secondary | ICD-10-CM | POA: Diagnosis not present

## 2018-11-26 DIAGNOSIS — S3993XA Unspecified injury of pelvis, initial encounter: Secondary | ICD-10-CM | POA: Diagnosis not present

## 2018-11-26 LAB — CBC WITH DIFFERENTIAL/PLATELET
Abs Immature Granulocytes: 0.14 10*3/uL — ABNORMAL HIGH (ref 0.00–0.07)
BASOS ABS: 0.1 10*3/uL (ref 0.0–0.1)
Basophils Relative: 1 %
EOS ABS: 0.1 10*3/uL (ref 0.0–0.5)
EOS PCT: 1 %
HEMATOCRIT: 44.8 % (ref 36.0–46.0)
HEMOGLOBIN: 14.6 g/dL (ref 12.0–15.0)
Immature Granulocytes: 1 %
LYMPHS PCT: 6 %
Lymphs Abs: 0.7 10*3/uL (ref 0.7–4.0)
MCH: 29.7 pg (ref 26.0–34.0)
MCHC: 32.6 g/dL (ref 30.0–36.0)
MCV: 91.1 fL (ref 80.0–100.0)
Monocytes Absolute: 1 10*3/uL (ref 0.1–1.0)
Monocytes Relative: 8 %
NRBC: 0 % (ref 0.0–0.2)
Neutro Abs: 9.6 10*3/uL — ABNORMAL HIGH (ref 1.7–7.7)
Neutrophils Relative %: 83 %
Platelets: 637 10*3/uL — ABNORMAL HIGH (ref 150–400)
RBC: 4.92 MIL/uL (ref 3.87–5.11)
RDW: 12.5 % (ref 11.5–15.5)
WBC: 11.6 10*3/uL — ABNORMAL HIGH (ref 4.0–10.5)

## 2018-11-26 LAB — TROPONIN I: Troponin I: <0.03 ng/mL (ref <0.03)

## 2018-11-26 LAB — COMPREHENSIVE METABOLIC PANEL
ALBUMIN: 4 g/dL (ref 3.5–5.0)
ALT: 16 U/L (ref 0–44)
ANION GAP: 7 (ref 5–15)
AST: 23 U/L (ref 15–41)
Alkaline Phosphatase: 54 U/L (ref 38–126)
BILIRUBIN TOTAL: 1 mg/dL (ref 0.3–1.2)
BUN: 19 mg/dL (ref 8–23)
CO2: 27 mmol/L (ref 22–32)
Calcium: 8.8 mg/dL — ABNORMAL LOW (ref 8.9–10.3)
Chloride: 100 mmol/L (ref 98–111)
Creatinine, Ser: 0.5 mg/dL (ref 0.44–1.00)
GFR calc Af Amer: >60 mL/min (ref >60)
GLUCOSE: 148 mg/dL — AB (ref 70–99)
Potassium: 4 mmol/L (ref 3.5–5.1)
Sodium: 134 mmol/L — ABNORMAL LOW (ref 135–145)
TOTAL PROTEIN: 6.8 g/dL (ref 6.5–8.1)

## 2018-11-26 LAB — URINALYSIS, COMPLETE (UACMP) WITH MICROSCOPIC
Bacteria, UA: NONE SEEN
Bilirubin Urine: NEGATIVE
Glucose, UA: NEGATIVE mg/dL
Hgb urine dipstick: NEGATIVE
Ketones, ur: 5 mg/dL — AB
Leukocytes, UA: NEGATIVE
Nitrite: NEGATIVE
PH: 6 (ref 5.0–8.0)
Protein, ur: NEGATIVE mg/dL
Specific Gravity, Urine: 1.016 (ref 1.005–1.030)

## 2018-11-26 LAB — CG4 I-STAT (LACTIC ACID): LACTIC ACID, VENOUS: 1.13 mmol/L (ref 0.5–1.9)

## 2018-11-26 LAB — CK: CK TOTAL: 20 U/L — AB (ref 38–234)

## 2018-11-26 NOTE — Discharge Instructions (Addendum)
You were evaluated for confusion, and multiple falls, and although no certain cause was found, your exam and evaluation are overall reassuring in the emergency [facility] today.  Return to the emergency department immediately for any worsening condition including new or worsening confusion or altered mental status, injury, or any other symptoms concerning to you.

## 2018-11-26 NOTE — ED Provider Notes (Signed)
[facility] Emergency Department Provider Note ____________________________________________   I have reviewed the triage vital signs and the triage nursing note.  HISTORY  Chief Complaint Fall   Historian Level 5 Caveat History Limited by confusion  HPI Shelly Padilla is a 84 y.o. female by chart review with a history of meningioma, A. fib, depression, TIA, hypertension, arrives to the ED by EMS from [facility] assisted living where she is known to be alert and oriented and interactive, found beside her bed on both knees without any obvious evidence of trauma, but unable to state why she was there.  She is not answering questions appropriately, and this is not her baseline.  No reported chest pain, headache, fever, vomiting, trouble breathing, or focal weakness or numbness.     Past Medical History:  Diagnosis Date  . Atrial fibrillation ([place])   . Brain tumor ([place])    meningioma  . Glaucoma   . H/O paroxysmal supraventricular tachycardia    documented by Holter Monitor  . Hemorrhoids   . Hiatal hernia   . History of ovarian cyst   . HTN (hypertension)   . Insomnia   . Palpitations   . Thrombocytosis [facility])     Patient Active Problem List   Diagnosis Date Noted  . MDD (major depressive disorder), single episode, moderate ([place]) 09/08/2018  . Duodenitis 07/14/2018  . 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 Carotid artery disease ([place]) 03/03/2018  . TIA (transient ischemic attack) 11/26/2017  . Chronic venous insufficiency 06/09/2016  . Right inguinal hernia 06/09/2016  . DD (diverticular disease) 04/10/2016  . Glaucoma 04/10/2016  . Hemorrhoid 04/10/2016  . Bilateral ovarian cysts 03/18/2016  . Thrombocytosis ([place])   . Preventative health care 06/28/2015  . Varicose veins 01/31/2015  . Advance directive discussed with patient 12/26/2014  . Meningioma ([place]) 10/01/2014  . Insomnia 05/18/2014  . Episodic mood disorder ([place]) 10/18/2012  . Paroxysmal  supraventricular tachycardia ([place]) 03/08/2012  . HTN (hypertension) 03/08/2012    Past Surgical History:  Procedure Laterality Date  . CATARACT EXTRACTION     left eye  . Leg vein stripping  1970  . TONSILLECTOMY  1929    Prior to Admission medications   Medication Sig Start Date End Date Taking? Authorizing Provider  acetaminophen (TYLENOL) 325 MG tablet Take 325-650 mg by mouth every 4 (four) hours as needed for mild pain or fever.    Yes [provider]  carbamide peroxide (DEBROX) 6.5 % OTIC solution Place 5 drops into both ears daily.   Yes [provider]  famotidine (PEPCID) 20 MG tablet Take 1 tablet (20 mg total) by mouth 2 (two) times daily. 07/04/18  Yes Paulette Blanch, MD  hydrochlorothiazide (HYDRODIURIL) 25 MG tablet Take 25 mg by mouth daily. Take 25 mg by mouth in the morning if pedal edema is noted   Yes [provider]  Melatonin 5 MG CAPS Take 5 mg by mouth at bedtime as needed.   Yes [provider]  metoprolol succinate (TOPROL-XL) 50 MG 24 hr tablet Take 1 tablet (50 mg total) by mouth daily with supper. Take with or immediately following a meal. 09/28/17 11/26/18 Yes Gollan, Kathlene November, MD  Multiple Vitamins-Minerals (CENTRUM SILVER PO) Take 1 tablet by mouth daily.    Yes [provider]  nystatin (MYCOSTATIN/NYSTOP) powder Apply 1 Bottle topically 2 (two) times daily.   Yes [provider]  ondansetron (ZOFRAN) 4 MG tablet Take 4 mg by mouth 3 (three) times  daily as needed for nausea.   Yes [provider]  polyethylene glycol (MIRALAX / GLYCOLAX) packet Take 17 g by mouth daily.   Yes [provider]  ramipril (ALTACE) 10 MG capsule TAKE ONE CAPSULE BY MOUTH ONCE DAILY 10/07/16  Yes Gollan, Kathlene November, MD  sennosides-docusate sodium (SENOKOT-S) 8.6-50 MG tablet Take 2 tablets by mouth daily.   Yes [provider]  sertraline (ZOLOFT) 50 MG tablet Take 50 mg by mouth daily.   Yes [provider]  timolol (TIMOPTIC) 0.5 % ophthalmic solution Place 1 drop into both eyes daily.  04/29/15  Yes [provider]    Allergies  Allergen Reactions  . Amoxil [Amoxicillin] Anxiety  . Codeine   . Pacerone  [Amiodarone Hcl] Nausea And Vomiting  . Sulfa Drugs Cross Reactors   . Amlodipine Other (See Comments)    Mild edema    Family History  Problem Relation Age of Onset  . Heart attack Father   . Heart failure Brother     Social History Social History   Tobacco Use  . Smoking status: Never Smoker  . Smokeless tobacco: Never Used  Substance Use Topics  . Alcohol use: Yes    Alcohol/week: 0.0 standard drinks    Comment: occasionally wine  . Drug use: No    Review of Systems Limited by her poor history due to confusion, but she denies: Constitutional: Negative for fever. Eyes: Negative for visual changes. ENT: Negative for sore throat. Cardiovascular: Negative for chest pain. Respiratory: Negative for shortness of breath. Gastrointestinal: Negative for abdominal pain, vomiting and diarrhea. Genitourinary: Negative for dysuria. Musculoskeletal: Negative for back pain. Skin: Negative for rash. Neurological: Negative for headache.  ____________________________________________   PHYSICAL EXAM:  VITAL SIGNS: ED Triage Vitals  Enc Vitals Group     BP --      Pulse --      Resp --      Temp --      Temp src --      SpO2 --      Weight 11/26/18 0959 140 lb (63.5 kg)     Height --      Head Circumference --      Peak Flow --      Pain Score 11/26/18 0956 0     Pain Loc --      Pain Edu? --      Excl. in [place]? --      Constitutional: Alert and cooperative, but confused, does not really answer particular questions are asked.Marland Kitchen  HEENT      Head: Normocephalic and atraumatic.      Eyes: Conjunctivae are normal. Pupils equal and round.       Ears:         Nose: No congestion/rhinnorhea.      Mouth/Throat: Mucous membranes are mildly dry.       Neck: No stridor. Cardiovascular/Chest: Normal rate, regular rhythm.  No murmurs, rubs, or gallops. Respiratory: Normal respiratory effort without tachypnea nor retractions. Breath sounds are clear and equal bilaterally. No wheezes/rales/rhonchi. Gastrointestinal: Soft. No distention, no guarding, no rebound. Nontender.    Genitourinary/rectal:Deferred Musculoskeletal: Nontender with normal range of motion in all extremities. No joint effusions.  No lower extremity tenderness.  No edema. Neurologic: No facial droop.  Answers questions like what is your name with something unrelated like asking the nurses if they are quintuplet's.  No gross or focal neurologic deficits are appreciated. Skin:  Skin is warm, dry and  intact. No rash noted. Psychiatric: No agitation.   ____________________________________________  LABS (pertinent positives/negatives) I, Lisa Roca, MD the attending physician have reviewed the labs noted below.  Labs Reviewed  CBC WITH DIFFERENTIAL/PLATELET - Abnormal; Notable for the following components:      Result Value   WBC 11.6 (*)    Platelets 637 (*)    Neutro Abs 9.6 (*)    Abs Immature Granulocytes 0.14 (*)    All other components within normal limits  COMPREHENSIVE METABOLIC PANEL - Abnormal; Notable for the following components:   Sodium 134 (*)    Glucose, Bld 148 (*)    Calcium 8.8 (*)    All other components within normal limits  URINALYSIS, COMPLETE (UACMP) WITH MICROSCOPIC - Abnormal; Notable for the following components:   Color, Urine YELLOW (*)    APPearance CLEAR (*)    Ketones, ur 5 (*)    All other components within normal limits  CK - Abnormal; Notable for the following components:   Total CK 20 (*)    All other components within normal limits  TROPONIN I  I-STAT CG4 LACTIC ACID, ED  CG4 I-STAT (LACTIC ACID)    ____________________________________________    EKG I, Lisa Roca, MD, the attending physician have personally viewed and  interpreted all ECGs.  73 bpm.  Normal sinus rhythm.  Right bundle branch block.  Nonspecific ST/T wave. ____________________________________________  RADIOLOGY   Chest x-ray portable:  IMPRESSION: 1. Lungs are hypoinflated but clear.  CT head without contrast:IMPRESSION: 1. No acute intracranial abnormality. 2. Stable approximately 3 cm right temporal occipital meningioma. 3. Similar degree of atrophy and chronic microvascular ischemic white matter disease.  Right hip with pelvis:  IMPRESSION: 1. No evidence of acute fracture or malalignment. 2. The bones appear demineralized. 3. Mild atherosclerotic vascular calcifications.   CT pelvis:  IMPRESSION: 1. No acute fracture identified. Please note that in the setting of generalized osteopenia, nondisplaced fractures may remain occult by CT imaging. If there is persistent clinical concern (patient unable to bear weight) consider further evaluation with MRI. 2. Multiple ovarian cysts which remain grossly stable across multiple prior studies. 3. Aortic Atherosclerosis (ICD10-170.0). 4. Advanced lower lumbar degenerative disc disease.  __________________________________________  PROCEDURES  Procedure(s) performed: None  Procedures  Critical Care performed: None   ____________________________________________  ED COURSE / ASSESSMENT AND PLAN  Pertinent labs & imaging results that were available during my care of the patient were reviewed by me and considered in my medical decision making (see chart for details).    This patient is apparently typically alert and oriented, and she is confused today.  From the standpoint of being found on the side of bed, she does not have any obvious signs of injury other than maybe is a little bit of pressure redness to both knees as she was reportedly found on her knees or kneeling.  No evidence of head injury on exam, but she is confused.   Son came to the bedside and feels like she  is not really confused for him.  He think she is probably at baseline.  He thinks that she has been declining especially with the falls recently and has been working with [facility] to hopefully move her closer to the nursing station.  She does have a bruise about the right greater trochanter and seemed to be wincing when I moved it, so we did choose to image that and the x-ray was negative.  Chose to CT to make sure there  is no occult fracture.  She has been bearing weight on it, so I do not have an additional higher suspicion of missed fracture and I am not recommending MRI at this point time.    CONSULTATIONS:  None  Patient / Family / Caregiver informed of clinical course, medical decision-making process, and agree with plan.   I discussed return precautions, follow-up instructions, and discharge instructions with patient and/or family.  Discharge Instructions : You were evaluated for confusion, and multiple falls, and although no certain cause was found, your exam and evaluation are overall reassuring in the emergency [place] today.  Return to the emergency department immediately for any worsening condition including new or worsening confusion or altered mental status, injury, or any other symptoms concerning to you.    ___________________________________________   FINAL CLINICAL IMPRESSION(S) / ED DIAGNOSES   Final diagnoses:  Multiple falls      ___________________________________________         Note: This dictation was prepared with Dragon dictation. Any transcriptional errors that result from this process are unintentional    Lisa Roca, MD 11/26/18 1401

## 2018-11-26 NOTE — ED Notes (Signed)
Assisted to bedpan

## 2018-11-26 NOTE — ED Notes (Signed)
Patient transported to CT 

## 2018-11-26 NOTE — ED Triage Notes (Signed)
Multiple falls over last week. Pt normally alert and oriented at baseline. Very confused on arrival to ED. Unable to answer questions including name. When asked pt name pt gives statement that does not even relate to question.  sats 89% on arrival, up and down from 89-97.

## 2018-11-26 NOTE — ED Notes (Signed)
Returned from [place]. Will wait on results. Son at bedside.

## 2018-11-26 NOTE — ED Notes (Signed)
Pt now oriented to person.

## 2018-11-27 ENCOUNTER — Emergency Department: Payer: Medicare Other

## 2018-11-27 ENCOUNTER — Inpatient Hospital Stay
Admission: EM | Admit: 2018-11-27 | Discharge: 2018-11-30 | DRG: 065 | Disposition: A | Discharge status: Skilled Nursing Facility | Payer: Medicare Other | Attending: Internal Medicine | Admitting: Internal Medicine

## 2018-11-27 ENCOUNTER — Other Ambulatory Visit: Payer: Self-pay

## 2018-11-27 DIAGNOSIS — R296 Repeated falls: Secondary | ICD-10-CM | POA: Diagnosis present

## 2018-11-27 DIAGNOSIS — G4700 Insomnia, unspecified: Secondary | ICD-10-CM | POA: Diagnosis present

## 2018-11-27 DIAGNOSIS — R4189 Other symptoms and signs involving cognitive functions and awareness: Secondary | ICD-10-CM | POA: Diagnosis not present

## 2018-11-27 DIAGNOSIS — Z9181 History of falling: Secondary | ICD-10-CM | POA: Diagnosis not present

## 2018-11-27 DIAGNOSIS — I4510 Unspecified right bundle-branch block: Secondary | ICD-10-CM | POA: Diagnosis present

## 2018-11-27 DIAGNOSIS — I639 Cerebral infarction, unspecified: Secondary | ICD-10-CM | POA: Diagnosis not present

## 2018-11-27 DIAGNOSIS — H409 Unspecified glaucoma: Secondary | ICD-10-CM | POA: Diagnosis present

## 2018-11-27 DIAGNOSIS — Z79899 Other long term (current) drug therapy: Secondary | ICD-10-CM | POA: Diagnosis not present

## 2018-11-27 DIAGNOSIS — Z741 Need for assistance with personal care: Secondary | ICD-10-CM | POA: Diagnosis not present

## 2018-11-27 DIAGNOSIS — W19XXXA Unspecified fall, initial encounter: Secondary | ICD-10-CM | POA: Diagnosis not present

## 2018-11-27 DIAGNOSIS — F0150 Vascular dementia without behavioral disturbance: Secondary | ICD-10-CM | POA: Diagnosis present

## 2018-11-27 DIAGNOSIS — I482 Chronic atrial fibrillation, unspecified: Secondary | ICD-10-CM | POA: Diagnosis present

## 2018-11-27 DIAGNOSIS — Z7401 Bed confinement status: Secondary | ICD-10-CM | POA: Diagnosis not present

## 2018-11-27 DIAGNOSIS — R400 Somnolence: Secondary | ICD-10-CM | POA: Diagnosis not present

## 2018-11-27 DIAGNOSIS — R1312 Dysphagia, oropharyngeal phase: Secondary | ICD-10-CM | POA: Diagnosis not present

## 2018-11-27 DIAGNOSIS — J181 Lobar pneumonia, unspecified organism: Secondary | ICD-10-CM

## 2018-11-27 DIAGNOSIS — Z882 Allergy status to sulfonamides status: Secondary | ICD-10-CM | POA: Diagnosis not present

## 2018-11-27 DIAGNOSIS — Z8673 Personal history of transient ischemic attack (TIA), and cerebral infarction without residual deficits: Secondary | ICD-10-CM | POA: Diagnosis not present

## 2018-11-27 DIAGNOSIS — I6340 Cerebral infarction due to embolism of unspecified cerebral artery: Principal | ICD-10-CM | POA: Diagnosis present

## 2018-11-27 DIAGNOSIS — R4182 Altered mental status, unspecified: Secondary | ICD-10-CM | POA: Diagnosis present

## 2018-11-27 DIAGNOSIS — Z66 Do not resuscitate: Secondary | ICD-10-CM | POA: Diagnosis present

## 2018-11-27 DIAGNOSIS — Z8249 Family history of ischemic heart disease and other diseases of the circulatory system: Secondary | ICD-10-CM

## 2018-11-27 DIAGNOSIS — R402142 Coma scale, eyes open, spontaneous, at arrival to emergency department: Secondary | ICD-10-CM | POA: Diagnosis present

## 2018-11-27 DIAGNOSIS — R410 Disorientation, unspecified: Secondary | ICD-10-CM

## 2018-11-27 DIAGNOSIS — M2550 Pain in unspecified joint: Secondary | ICD-10-CM | POA: Diagnosis not present

## 2018-11-27 DIAGNOSIS — R278 Other lack of coordination: Secondary | ICD-10-CM | POA: Diagnosis not present

## 2018-11-27 DIAGNOSIS — Z888 Allergy status to other drugs, medicaments and biological substances status: Secondary | ICD-10-CM | POA: Diagnosis not present

## 2018-11-27 DIAGNOSIS — R0902 Hypoxemia: Secondary | ICD-10-CM | POA: Diagnosis not present

## 2018-11-27 DIAGNOSIS — D329 Benign neoplasm of meninges, unspecified: Secondary | ICD-10-CM | POA: Diagnosis present

## 2018-11-27 DIAGNOSIS — F05 Delirium due to known physiological condition: Secondary | ICD-10-CM | POA: Diagnosis present

## 2018-11-27 DIAGNOSIS — R262 Difficulty in walking, not elsewhere classified: Secondary | ICD-10-CM | POA: Diagnosis not present

## 2018-11-27 DIAGNOSIS — E860 Dehydration: Secondary | ICD-10-CM | POA: Diagnosis present

## 2018-11-27 DIAGNOSIS — E7800 Pure hypercholesterolemia, unspecified: Secondary | ICD-10-CM | POA: Diagnosis not present

## 2018-11-27 DIAGNOSIS — R402242 Coma scale, best verbal response, confused conversation, at arrival to emergency department: Secondary | ICD-10-CM | POA: Diagnosis present

## 2018-11-27 DIAGNOSIS — R404 Transient alteration of awareness: Secondary | ICD-10-CM | POA: Diagnosis not present

## 2018-11-27 DIAGNOSIS — I4891 Unspecified atrial fibrillation: Secondary | ICD-10-CM | POA: Diagnosis not present

## 2018-11-27 DIAGNOSIS — F329 Major depressive disorder, single episode, unspecified: Secondary | ICD-10-CM | POA: Diagnosis not present

## 2018-11-27 DIAGNOSIS — R443 Hallucinations, unspecified: Secondary | ICD-10-CM | POA: Diagnosis present

## 2018-11-27 DIAGNOSIS — R109 Unspecified abdominal pain: Secondary | ICD-10-CM | POA: Diagnosis not present

## 2018-11-27 DIAGNOSIS — I10 Essential (primary) hypertension: Secondary | ICD-10-CM | POA: Diagnosis present

## 2018-11-27 DIAGNOSIS — R402362 Coma scale, best motor response, obeys commands, at arrival to emergency department: Secondary | ICD-10-CM | POA: Diagnosis present

## 2018-11-27 DIAGNOSIS — K5900 Constipation, unspecified: Secondary | ICD-10-CM | POA: Diagnosis not present

## 2018-11-27 DIAGNOSIS — M6281 Muscle weakness (generalized): Secondary | ICD-10-CM | POA: Diagnosis not present

## 2018-11-27 DIAGNOSIS — J9811 Atelectasis: Secondary | ICD-10-CM | POA: Diagnosis not present

## 2018-11-27 DIAGNOSIS — Z880 Allergy status to penicillin: Secondary | ICD-10-CM | POA: Diagnosis not present

## 2018-11-27 DIAGNOSIS — R2681 Unsteadiness on feet: Secondary | ICD-10-CM | POA: Diagnosis not present

## 2018-11-27 DIAGNOSIS — I63413 Cerebral infarction due to embolism of bilateral middle cerebral arteries: Secondary | ICD-10-CM | POA: Diagnosis not present

## 2018-11-27 LAB — COMPREHENSIVE METABOLIC PANEL
ALT: 15 U/L (ref 0–44)
AST: 28 U/L (ref 15–41)
Albumin: 4.1 g/dL (ref 3.5–5.0)
Alkaline Phosphatase: 52 U/L (ref 38–126)
Anion gap: 7 (ref 5–15)
BUN: 21 mg/dL (ref 8–23)
CHLORIDE: 100 mmol/L (ref 98–111)
CO2: 28 mmol/L (ref 22–32)
Calcium: 8.6 mg/dL — ABNORMAL LOW (ref 8.9–10.3)
Creatinine, Ser: 0.57 mg/dL (ref 0.44–1.00)
GFR calc Af Amer: >60 mL/min (ref >60)
GFR calc non Af Amer: >60 mL/min (ref >60)
Glucose, Bld: 131 mg/dL — ABNORMAL HIGH (ref 70–99)
Potassium: 3.5 mmol/L (ref 3.5–5.1)
Sodium: 135 mmol/L (ref 135–145)
Total Bilirubin: 0.7 mg/dL (ref 0.3–1.2)
Total Protein: 6.6 g/dL (ref 6.5–8.1)

## 2018-11-27 LAB — CBC WITH DIFFERENTIAL/PLATELET
Abs Immature Granulocytes: 0.17 10*3/uL — ABNORMAL HIGH (ref 0.00–0.07)
BASOS PCT: 0 %
Basophils Absolute: 0.1 10*3/uL (ref 0.0–0.1)
Eosinophils Absolute: 0 10*3/uL (ref 0.0–0.5)
Eosinophils Relative: 0 %
HCT: 43.3 % (ref 36.0–46.0)
Hemoglobin: 14 g/dL (ref 12.0–15.0)
Immature Granulocytes: 1 %
Lymphocytes Relative: 6 %
Lymphs Abs: 0.9 10*3/uL (ref 0.7–4.0)
MCH: 29 pg (ref 26.0–34.0)
MCHC: 32.3 g/dL (ref 30.0–36.0)
MCV: 89.6 fL (ref 80.0–100.0)
Monocytes Absolute: 1.6 10*3/uL — ABNORMAL HIGH (ref 0.1–1.0)
Monocytes Relative: 11 %
NEUTROS ABS: 11.5 10*3/uL — AB (ref 1.7–7.7)
Neutrophils Relative %: 82 %
PLATELETS: 753 10*3/uL — AB (ref 150–400)
RBC: 4.83 MIL/uL (ref 3.87–5.11)
RDW: 12.5 % (ref 11.5–15.5)
WBC: 14.2 10*3/uL — ABNORMAL HIGH (ref 4.0–10.5)
nRBC: 0 % (ref 0.0–0.2)

## 2018-11-27 LAB — URINALYSIS, COMPLETE (UACMP) WITH MICROSCOPIC
Bacteria, UA: NONE SEEN
Bilirubin Urine: NEGATIVE
Glucose, UA: NEGATIVE mg/dL
HGB URINE DIPSTICK: NEGATIVE
Ketones, ur: NEGATIVE mg/dL
Leukocytes, UA: NEGATIVE
NITRITE: NEGATIVE
Protein, ur: 30 mg/dL — AB
Specific Gravity, Urine: 1.02 (ref 1.005–1.030)
pH: 5 (ref 5.0–8.0)

## 2018-11-27 LAB — BLOOD GAS, ARTERIAL
Acid-Base Excess: 5.2 mmol/L — ABNORMAL HIGH (ref 0.0–2.0)
Bicarbonate: 29 mmol/L — ABNORMAL HIGH (ref 20.0–28.0)
FIO2: 0.21
O2 Saturation: 96.3 %
Patient temperature: 37
pCO2 arterial: 39 mmHg (ref 32.0–48.0)
pH, Arterial: 7.48 — ABNORMAL HIGH (ref 7.350–7.450)
pO2, Arterial: 78 mmHg — ABNORMAL LOW (ref 83.0–108.0)

## 2018-11-27 LAB — TSH: TSH: 2.017 u[IU]/mL (ref 0.350–4.500)

## 2018-11-27 LAB — INFLUENZA PANEL BY PCR (TYPE A & B)
Influenza A By PCR: NEGATIVE
Influenza B By PCR: NEGATIVE

## 2018-11-27 LAB — LACTIC ACID, PLASMA: Lactic Acid, Venous: 1.3 mmol/L (ref 0.5–1.9)

## 2018-11-27 MED ORDER — METOPROLOL SUCCINATE ER 50 MG PO TB24
50.0000 mg | ORAL_TABLET | Freq: Every day | ORAL | Status: DC
  Administered 2018-11-28 – 2018-11-30 (×3): 50 mg via ORAL
  Filled 2018-11-27 (×3): qty 1

## 2018-11-27 MED ORDER — BISMUTH SUBSALICYLATE 262 MG/15ML PO SUSP
10.0000 mL | Freq: Three times a day (TID) | ORAL | Status: DC
  Administered 2018-11-28 – 2018-11-29 (×2): 10 mL via ORAL
  Filled 2018-11-27: qty 118

## 2018-11-27 MED ORDER — FAMOTIDINE 20 MG PO TABS
20.0000 mg | ORAL_TABLET | Freq: Two times a day (BID) | ORAL | Status: DC
  Administered 2018-11-28 – 2018-11-30 (×4): 20 mg via ORAL
  Filled 2018-11-27 (×4): qty 1

## 2018-11-27 MED ORDER — SODIUM CHLORIDE 0.9 % IV BOLUS
500.0000 mL | Freq: Once | INTRAVENOUS | Status: AC
  Administered 2018-11-27: 500 mL via INTRAVENOUS

## 2018-11-27 MED ORDER — LEVOFLOXACIN IN D5W 750 MG/150ML IV SOLN
750.0000 mg | INTRAVENOUS | Status: DC
  Administered 2018-11-28: 750 mg via INTRAVENOUS
  Filled 2018-11-27 (×2): qty 150

## 2018-11-27 MED ORDER — ACETAMINOPHEN 325 MG PO TABS: 650.0000 mg | ORAL_TABLET | Freq: Four times a day (QID) | ORAL | Status: DC | PRN

## 2018-11-27 MED ORDER — ACETAMINOPHEN 650 MG RE SUPP: 650.0000 mg | Freq: Four times a day (QID) | RECTAL | Status: DC | PRN

## 2018-11-27 MED ORDER — LORAZEPAM 2 MG/ML IJ SOLN
1.0000 mg | Freq: Once | INTRAMUSCULAR | Status: AC
  Administered 2018-11-27: 1 mg via INTRAVENOUS
  Filled 2018-11-27: qty 1

## 2018-11-27 MED ORDER — POTASSIUM CHLORIDE IN NACL 20-0.9 MEQ/L-% IV SOLN
INTRAVENOUS | Status: DC
  Administered 2018-11-28: via INTRAVENOUS
  Filled 2018-11-27 (×4): qty 1000

## 2018-11-27 MED ORDER — ONDANSETRON HCL 4 MG/2ML IJ SOLN: 4.0000 mg | Freq: Four times a day (QID) | INTRAMUSCULAR | Status: DC | PRN

## 2018-11-27 MED ORDER — HEPARIN SODIUM (PORCINE) 5000 UNIT/ML IJ SOLN
5000.0000 [IU] | Freq: Three times a day (TID) | INTRAMUSCULAR | Status: DC
  Administered 2018-11-28 – 2018-11-30 (×8): 5000 [IU] via SUBCUTANEOUS
  Filled 2018-11-27 (×8): qty 1

## 2018-11-27 MED ORDER — LEVOFLOXACIN IN D5W 750 MG/150ML IV SOLN: 750.0000 mg | INTRAVENOUS | Status: DC

## 2018-11-27 MED ORDER — ONDANSETRON HCL 4 MG PO TABS: 4.0000 mg | ORAL_TABLET | Freq: Four times a day (QID) | ORAL | Status: DC | PRN

## 2018-11-27 MED ORDER — RAMIPRIL 10 MG PO CAPS
10.0000 mg | ORAL_CAPSULE | Freq: Every day | ORAL | Status: DC
  Administered 2018-11-28 – 2018-11-30 (×3): 10 mg via ORAL
  Filled 2018-11-27 (×3): qty 1

## 2018-11-27 MED ORDER — IOPAMIDOL (ISOVUE-300) INJECTION 61%
100.0000 mL | Freq: Once | INTRAVENOUS | Status: AC | PRN
  Administered 2018-11-27: 100 mL via INTRAVENOUS

## 2018-11-27 NOTE — ED Triage Notes (Signed)
Pt arrived via ems for report of altered mental status - this pt was in ED yesterday for a fall and had a negative workup - today she returns for altered mental status

## 2018-11-27 NOTE — ED Notes (Signed)
Pt requested to void and missed bedpan - pt cleaned and be linen changed

## 2018-11-27 NOTE — ED Notes (Signed)
Dr. Burlene Arnt at bedside talking to patient's family

## 2018-11-27 NOTE — H&P (Addendum)
PCP:   Venia Carbon, MD   Chief Complaint:  increased confusion  HPI:  This is a 84 y/o female who resided at [facility]. She has been falling and was moved to the NH.  She is normally alert and oriented per records.  Yesterday she fell and was brought to the ER. She has been falling once a week for the klast few  Weeks. Work-up was negative including CT head.  She has a stable meningioma.  Apparently the patient remained confused and was sent to the ER again today.  Patient's UA chest x-ray and flu are all normal but her white blood cell count has increased to 14.  The patient apparently is also having some hallucinations.  New medications include zoloft. Falling started after the zoloft which has been decreased by her PCP.  She does have a history of atrial fibrillation and a meningioma  Patient somnolent but arousable. Does not provided any history. Appears non toxic  Review of Systems:  The patient denies anorexia, fever, weight loss, confusion, vision loss, decreased hearing, hoarseness, chest pain, syncope, dyspnea on exertion, peripheral edema, balance deficits, hemoptysis, abdominal pain, melena, hematochezia, severe indigestion/heartburn, hematuria, incontinence, genital sores, muscle weakness, suspicious skin lesions, transient blindness, difficulty walking, depression, unusual weight change, abnormal bleeding, enlarged lymph nodes, angioedema, and breast masses.  Past Medical History: Past Medical History:  Diagnosis Date  . Atrial fibrillation ([place])   . Brain tumor ([place])    meningioma  . Glaucoma   . H/O paroxysmal supraventricular tachycardia    documented by Holter Monitor  . Hemorrhoids   . Hiatal hernia   . History of ovarian cyst   . HTN (hypertension)   . Insomnia   . Palpitations   . Thrombocytosis ([place])    Past Surgical History:  Procedure Laterality Date  . CATARACT EXTRACTION     left eye  . Leg vein stripping  1970  . TONSILLECTOMY  1929     Medications: Prior to Admission medications   Medication Sig Start Date End Date Taking? Authorizing Provider  acetaminophen (TYLENOL) 325 MG tablet Take 325-650 mg by mouth every 4 (four) hours as needed for mild pain or fever.    Yes [provider]  alum & mag hydroxide-simeth (MAALOX/MYLANTA) 200-200-20 MG/5ML suspension Take 30 mLs by mouth every 4 (four) hours as needed for indigestion or heartburn.   Yes [provider]  bismuth subsalicylate (KAOPECTATE) 262 MG/15ML suspension Take 10 mLs by mouth every 6 (six) hours as needed.   Yes [provider]  carbamide peroxide (DEBROX) 6.5 % OTIC solution Place 5 drops into both ears daily.   Yes [provider]  dextromethorphan-guaiFENesin (TUSSIN DM) 10-100 MG/5ML liquid Take 10 mLs by mouth every 4 (four) hours as needed for cough.   Yes [provider]  diphenhydrAMINE (BENADRYL) 25 mg capsule Take 25 mg by mouth every 4 (four) hours as needed for itching or allergies.   Yes [provider]  famotidine (PEPCID) 20 MG tablet Take 1 tablet (20 mg total) by mouth 2 (two) times daily. Patient taking differently: Take 20 mg by mouth 2 (two) times daily. (0700 & 1800) 07/04/18  Yes Paulette Blanch, MD  hydrochlorothiazide (HYDRODIURIL) 25 MG tablet Take 25 mg by mouth daily. Take 25 mg by mouth in the morning if pedal edema is noted   Yes [provider]  magnesium hydroxide (MILK OF MAGNESIA) 400 MG/5ML suspension Take 30 mLs by mouth daily as needed  for mild constipation.   Yes [provider]  Melatonin 5 MG CAPS Take 5 mg by mouth at bedtime as needed. (2100)   Yes [provider]  metoprolol succinate (TOPROL-XL) 50 MG 24 hr tablet Take 1 tablet (50 mg total) by mouth daily with supper. Take with or immediately following a meal. Patient taking differently: Take 50 mg by mouth daily with supper. Take with or immediately following a meal. (1800) 09/28/17 11/27/18 Yes  Gollan, Kathlene November, MD  Multiple Vitamins-Minerals (CENTRUM SILVER PO) Take 1 tablet by mouth daily. (1800)   Yes [provider]  nystatin (MYCOSTATIN/NYSTOP) powder Apply 1 Bottle topically 2 (two) times daily. Under breast and to groin/abdominal fold areas as needed for rash   Yes [provider]  ondansetron (ZOFRAN) 4 MG tablet Take 4 mg by mouth 3 (three) times daily as needed for nausea.   Yes [provider]  polyethylene glycol (MIRALAX / GLYCOLAX) packet Take 17 g by mouth daily. (0700)   Yes [provider]  ramipril (ALTACE) 10 MG capsule TAKE ONE CAPSULE BY MOUTH ONCE DAILY Patient taking differently: Take 10 mg by mouth daily. (0700) 10/07/16  Yes Gollan, Kathlene November, MD  sennosides-docusate sodium (SENOKOT-S) 8.6-50 MG tablet Take 2 tablets by mouth daily.   Yes [provider]  sertraline (ZOLOFT) 25 MG tablet Take 25 mg by mouth daily. Take one tablet by mouth every day for 1 week and then every other day for 1 week and then stop. (0700) 11/24/18 12/10/18 Yes [provider]  timolol (TIMOPTIC) 0.5 % ophthalmic solution Place 1 drop into both eyes daily. (1630) 04/29/15  Yes [provider]    Allergies:   Allergies  Allergen Reactions  . Amoxil [Amoxicillin] Anxiety  . Codeine   . Pacerone  [Amiodarone Hcl] Nausea And Vomiting  . Sulfa Drugs Cross Reactors   . Amlodipine Other (See Comments)    Mild edema    Social History:  reports that she has never smoked. She has never used smokeless tobacco. She reports current alcohol use. She reports that she does not use drugs.  Family History: Family History  Problem Relation Age of Onset  . Heart attack Father   . Heart failure Brother     Physical Exam: Vitals:   11/27/18 1102 11/27/18 1103 11/27/18 1130 11/27/18 1422  BP: (!) 136/94  (!) 168/81   Pulse: 69     Resp: 16     Temp: (!) 97.5 F (36.4 C)   (!) 97.3 F (36.3 C)  TempSrc: Axillary   Rectal   SpO2: 98%     Weight:  72.6 kg    Height:  5\' 8"  (1.727 m)      General:  [place] but confused, well developed and nourished, no acute distress Eyes: PERRLA, pink conjunctiva, no scleral icterus ENT: dry oral mucosa, neck supple, no thyromegaly Lungs: clear to ascultation, no wheeze, no crackles, no use of accessory muscles Cardiovascular: regular rate and rhythm, no regurgitation, no gallops, no murmurs. No carotid bruits, no JVD Abdomen: soft, positive BS, non-tender, non-distended, no organomegaly, not an acute abdomen GU: not examined Neuro: unable to properly assess d/t confusion Musculoskeletal:unable to properly assess d/t confusion Skin: no rash, no subcutaneous crepitation, no decubitus Psych: confused, sleepy   Labs on Admission:  Recent Labs    11/26/18 1011 11/27/18 1122  NA 134* 135  K 4.0 3.5  CL 100 100  CO2 27 28  GLUCOSE 148* 131*  BUN 19 21  CREATININE 0.50 0.57  CALCIUM 8.8* 8.6*   Recent Labs    11/26/18 1011 11/27/18 1122  AST 23 28  ALT 16 15  ALKPHOS 54 52  BILITOT 1.0 0.7  PROT 6.8 6.6  ALBUMIN 4.0 4.1   No results for input(s): LIPASE, AMYLASE in the last 72 hours. Recent Labs    11/26/18 1011 11/27/18 1122  WBC 11.6* 14.2*  NEUTROABS 9.6* 11.5*  HGB 14.6 14.0  HCT 44.8 43.3  MCV 91.1 89.6  PLT 637* 753*   Recent Labs    11/26/18 1011  CKTOTAL 20*  TROPONINI <0.03   Invalid input(s): POCBNP No results for input(s): DDIMER in the last 72 hours. No results for input(s): HGBA1C in the last 72 hours. No results for input(s): CHOL, HDL, LDLCALC, TRIG, CHOLHDL, LDLDIRECT in the last 72 hours. No results for input(s): TSH, T4TOTAL, T3FREE, THYROIDAB in the last 72 hours.  Invalid input(s): FREET3 No results for input(s): VITAMINB12, FOLATE, FERRITIN, TIBC, IRON, RETICCTPCT in the last 72 hours.  Micro Results: No results found for this or any previous visit (from the past 240 hour(s)).   Radiological Exams on  Admission: Dg Chest 1 View  Result Date: 11/27/2018 CLINICAL DATA:  Altered mental status. EXAM: CHEST  1 VIEW COMPARISON:  July 22, 2016 and November 26, 2017 FINDINGS: Minimal atelectasis in the left base. The heart, hila, mediastinum, lungs, and pleura are otherwise unremarkable and unchanged. IMPRESSION: Minimal atelectasis in the left base.  No other acute abnormalities. Electronically Signed   By: Dorise Bullion III M.D   On: 11/27/2018 14:24   Ct Head Wo Contrast  Result Date: 11/26/2018 CLINICAL DATA:  84 year old female with altered mental status, multiple recent falls EXAM: CT HEAD WITHOUT CONTRAST TECHNIQUE: Contiguous axial images were obtained from the base of the skull through the vertex without intravenous contrast. COMPARISON:  Prior head CT 11/26/2017; brain MRI 11/26/2017 FINDINGS: Brain: No evidence of acute infarction, hemorrhage, hydrocephalus, extra-axial collection or mass lesion/mass effect. Stable approximately 3 cm hyperdense rounded soft tissue structure arising from the dura of the right temporal occipital region. Correlation with prior MR imaging demonstrates a meningioma in the same location. No significant interval change. Similar degree of cerebral and cerebellar cortical atrophy. Periventricular, subcortical and deep white matter hypoattenuation are also stable and remain consistent with chronic microvascular ischemic white matter disease. Vascular: Atherosclerotic calcifications in the bilateral cavernous and supraclinoid internal carotid arteries. Skull: Normal. Negative for fracture or focal lesion. Sinuses/Orbits: No acute finding. Other: None. IMPRESSION: 1. No acute intracranial abnormality. 2. Stable approximately 3 cm right temporal occipital meningioma. 3. Similar degree of atrophy and chronic microvascular ischemic white matter disease. Electronically Signed   By: Jacqulynn Cadet M.D.   On: 11/26/2018 10:49   Ct Pelvis Wo Contrast  Result Date:  11/26/2018 CLINICAL DATA:  84 year old female with multiple recent falls and right hip bruising. EXAM: CT PELVIS WITHOUT CONTRAST TECHNIQUE: Multidetector CT imaging of the pelvis was performed following the standard protocol without intravenous contrast. COMPARISON:  Recent prior CT scan of the abdomen and pelvis 07/03/2018 and 10/27/2013 FINDINGS: Urinary Tract: Limited evaluation in the absence of intravenous contrast. Unremarkable appearance of the bladder. Bowel:  No focal bowel wall thickening or evidence of obstruction. Vascular/Lymphatic: Limited evaluation in the absence of intravenous contrast. Atherosclerotic vascular calcifications are noted throughout the aorta and branch vessels. Reproductive: Unremarkable uterus. Multiple circumscribed low-attenuation cystic lesions affiliated with both ovaries. The largest affiliated with the right  ovary remains stable at approximately 3 cm. The smaller cystic lesions remain unchanged dating back to November of 2014. Other:  None. Musculoskeletal: Advanced lower lumbar degenerative disc disease. No definite acute fracture or malalignment. IMPRESSION: 1. No acute fracture identified. Please note that in the setting of generalized osteopenia, nondisplaced fractures may remain occult by CT imaging. If there is persistent clinical concern (patient unable to bear weight) consider further evaluation with MRI. 2. Multiple ovarian cysts which remain grossly stable across multiple prior studies. 3.  Aortic Atherosclerosis (ICD10-170.0). 4. Advanced lower lumbar degenerative disc disease. Electronically Signed   By: Jacqulynn Cadet M.D.   On: 11/26/2018 13:39   Dg Chest Portable 1 View  Result Date: 11/26/2018 CLINICAL DATA:  Confusion and hypoxia.  Multiple falls. EXAM: PORTABLE CHEST 1 VIEW COMPARISON:  07/22/2016 FINDINGS: Normal heart size. Decreased lung volumes. There is no pleural effusion or edema. No airspace opacities noted. IMPRESSION: 1. Lungs are  hypoinflated but clear. Electronically Signed   By: Kerby Moors M.D.   On: 11/26/2018 10:45   Dg Hip Unilat W Or Wo Pelvis 2-3 Views Right  Result Date: 11/26/2018 CLINICAL DATA:  84 year old female with right hip bruise EXAM: DG HIP (WITH OR WITHOUT PELVIS) 2-3V RIGHT COMPARISON:  Prior CT abdomen/pelvis 07/03/2018 FINDINGS: The bones appear diffusely osteopenic. No evidence of acute fracture or malalignment. The bony pelvis is intact. Multiple vascular phleboliths project over the pelvis. The right femoral head is located. IMPRESSION: 1. No evidence of acute fracture or malalignment. 2. The bones appear demineralized. 3. Mild atherosclerotic vascular calcifications. Electronically Signed   By: Jacqulynn Cadet M.D.   On: 11/26/2018 12:27    Assessment/Plan Present on Admission: . Altered mental status -Admit to MedSurg with telemetry -MRI brain ordered question thrombi embolic events -Add blood cultures, vitamin B12 levels and TSH and RPR -We will add a lactic acid level and CRP -ABG added -IVF hydration ordered -LP discussed with ER physician and patients son, as there is no clear etiology for patients confusion. Family declined. Will start empiric antibiotics and await result of culture -patients benadryl and melatonin d/ced  . HTN (hypertension) -elevated, MRI brain ordered -repeat CT head now   Riggins Cisek 11/27/2018, 4:39 PM

## 2018-11-27 NOTE — ED Provider Notes (Signed)
-----------------------------------------   6:25 PM on 11/27/2018 -----------------------------------------  Admitted to the hospital prior to my arrival, the hospitalist did asked the emergency room staff if we could consider an LP on this patient.  I did talk to the son.  I explained all the risk benefits and alternatives of LP including the risks of procedure and the risk of any possible sedation I may have to give her to perform the procedure as she is not lying still in the bed and would not understand the procedure.  After a comprehensive assessment of the risk benefits and alternative including the advantages of the information possibly again from an LP, he elected not to have it done.  I do not think this is unreasonable.  I have explained this to the hospitalist, family knows that we are certainly willing to try but they do not feel the risks are merited in a 84 year old patient who would not understand the procedure.   Schuyler Amor, MD 11/27/18 [phone]

## 2018-11-27 NOTE — ED Notes (Signed)
Pt actively hallucinating and talking to things/people that are not present - she is reaching for objects that are not there - she is attempting to climb out of bed - family remains at bedside

## 2018-11-27 NOTE — ED Notes (Signed)
Patient taken to imaging. 

## 2018-11-27 NOTE — ED Provider Notes (Signed)
[facility] Emergency Department Provider Note    First MD Initiated Contact with Patient 11/27/18 1054     (approximate)  I have reviewed the triage vital signs and the nursing notes.   HISTORY  Chief Complaint Altered Mental Status  Level V caveat:  confusion  HPI Shelly Padilla is a 84 y.o. female presents to the ER 24 hours after evaluation here yesterday for fall presents with worsening confusion and hallucinations.  Patient pleasant appearing but is quite confused hallucinating and reaching for things.  No interval falls.  No reported fevers.  Staff at facility told the family members to bring her back due to concern for urinary tract infection though urinalysis yesterday did not show any evidence of infection.    Past Medical History:  Diagnosis Date  . Atrial fibrillation ([place])   . Brain tumor ([place])    meningioma  . Glaucoma   . H/O paroxysmal supraventricular tachycardia    documented by Holter Monitor  . Hemorrhoids   . Hiatal hernia   . History of ovarian cyst   . HTN (hypertension)   . Insomnia   . Palpitations   . Thrombocytosis (HCC)    Family History  Problem Relation Age of Onset  . Heart attack Father   . Heart failure Brother    Past Surgical History:  Procedure Laterality Date  . CATARACT EXTRACTION     left eye  . Leg vein stripping  1970  . TONSILLECTOMY  1929   Patient Active Problem List   Diagnosis Date Noted  . MDD (major depressive disorder), single episode, moderate ([place]) 09/08/2018  . Duodenitis 07/14/2018  . 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 Carotid artery disease ([place]) 03/03/2018  . TIA (transient ischemic attack) 11/26/2017  . Chronic venous insufficiency 06/09/2016  . Right inguinal hernia 06/09/2016  . DD (diverticular disease) 04/10/2016  . Glaucoma 04/10/2016  . Hemorrhoid 04/10/2016  . Bilateral ovarian cysts 03/18/2016  . Thrombocytosis ([place])   . Preventative health care 06/28/2015  . Varicose  veins 01/31/2015  . Advance directive discussed with patient 12/26/2014  . Meningioma ([place]) 10/01/2014  . Insomnia 05/18/2014  . Episodic mood disorder ([place]) 10/18/2012  . Paroxysmal supraventricular tachycardia ([place]) 03/08/2012  . HTN (hypertension) 03/08/2012      Prior to Admission medications   Medication Sig Start Date End Date Taking? Authorizing Provider  acetaminophen (TYLENOL) 325 MG tablet Take 325-650 mg by mouth every 4 (four) hours as needed for mild pain or fever.    Yes [provider]  alum & mag hydroxide-simeth (MAALOX/MYLANTA) 200-200-20 MG/5ML suspension Take 30 mLs by mouth every 4 (four) hours as needed for indigestion or heartburn.   Yes [provider]  bismuth subsalicylate (KAOPECTATE) 262 MG/15ML suspension Take 10 mLs by mouth every 6 (six) hours as needed.   Yes [provider]  carbamide peroxide (DEBROX) 6.5 % OTIC solution Place 5 drops into both ears daily.   Yes [provider]  dextromethorphan-guaiFENesin (TUSSIN DM) 10-100 MG/5ML liquid Take 10 mLs by mouth every 4 (four) hours as needed for cough.   Yes [provider]  diphenhydrAMINE (BENADRYL) 25 mg capsule Take 25 mg by mouth every 4 (four) hours as needed for itching or allergies.   Yes [provider]  famotidine (PEPCID) 20 MG tablet Take 1 tablet (20 mg total) by mouth 2 (two) times daily. Patient taking differently: Take 20 mg by mouth 2 (two) times daily. (0700 & 1800) 07/04/18  Yes  Paulette Blanch, MD  hydrochlorothiazide (HYDRODIURIL) 25 MG tablet Take 25 mg by mouth daily. Take 25 mg by mouth in the morning if pedal edema is noted   Yes [provider]  magnesium hydroxide (MILK OF MAGNESIA) 400 MG/5ML suspension Take 30 mLs by mouth daily as needed for mild constipation.   Yes [provider]  Melatonin 5 MG CAPS Take 5 mg by mouth at bedtime as needed. (2100)   Yes [provider]  metoprolol succinate (TOPROL-XL)  50 MG 24 hr tablet Take 1 tablet (50 mg total) by mouth daily with supper. Take with or immediately following a meal. Patient taking differently: Take 50 mg by mouth daily with supper. Take with or immediately following a meal. (1800) 09/28/17 11/27/18 Yes Gollan, Kathlene November, MD  Multiple Vitamins-Minerals (CENTRUM SILVER PO) Take 1 tablet by mouth daily. (1800)   Yes [provider]  nystatin (MYCOSTATIN/NYSTOP) powder Apply 1 Bottle topically 2 (two) times daily. Under breast and to groin/abdominal fold areas as needed for rash   Yes [provider]  ondansetron (ZOFRAN) 4 MG tablet Take 4 mg by mouth 3 (three) times daily as needed for nausea.   Yes [provider]  polyethylene glycol (MIRALAX / GLYCOLAX) packet Take 17 g by mouth daily. (0700)   Yes [provider]  ramipril (ALTACE) 10 MG capsule TAKE ONE CAPSULE BY MOUTH ONCE DAILY Patient taking differently: Take 10 mg by mouth daily. (0700) 10/07/16  Yes Gollan, Kathlene November, MD  sennosides-docusate sodium (SENOKOT-S) 8.6-50 MG tablet Take 2 tablets by mouth daily.   Yes [provider]  sertraline (ZOLOFT) 25 MG tablet Take 25 mg by mouth daily. Take one tablet by mouth every day for 1 week and then every other day for 1 week and then stop. (0700) 11/24/18 12/10/18 Yes [provider]  timolol (TIMOPTIC) 0.5 % ophthalmic solution Place 1 drop into both eyes daily. (1630) 04/29/15  Yes [provider]    Allergies Amoxil [amoxicillin]; Codeine; Pacerone  [amiodarone hcl]; Sulfa drugs cross reactors; and Amlodipine    Social History Social History   Tobacco Use  . Smoking status: Never Smoker  . Smokeless tobacco: Never Used  Substance Use Topics  . Alcohol use: Yes    Alcohol/week: 0.0 standard drinks    Comment: occasionally wine  . Drug use: No    Review of Systems Patient denies headaches, rhinorrhea, blurry vision, numbness, shortness of breath, chest pain, edema,  cough, abdominal pain, nausea, vomiting, diarrhea, dysuria, fevers, rashes or hallucinations unless otherwise stated above in HPI. ____________________________________________   PHYSICAL EXAM:  VITAL SIGNS: Vitals:   11/27/18 1130 11/27/18 1422  BP: (!) 168/81   Pulse:    Resp:    Temp:  (!) 97.3 F (36.3 C)  SpO2:      Constitutional: Alert but disoriented and encephalopathic reaching for persons in the room that are clearly not present. Eyes: Conjunctivae are normal.  Head: Atraumatic. Nose: No congestion/rhinnorhea. Mouth/Throat: Mucous membranes are moist.   Neck: No stridor. Painless ROM. No evidence of meningismus Cardiovascular: Normal rate, regular rhythm. Grossly normal heart sounds.  Good peripheral circulation. Respiratory: Normal respiratory effort.  No retractions. Lungs CTAB. Gastrointestinal: Soft and nontender. No distention. No abdominal bruits. No CVA tenderness. Genitourinary:  Musculoskeletal: No lower extremity tenderness nor edema.  No joint effusions. Neurologic:   No gross focal neurologic deficits are appreciated. No facial droop Skin:  Skin is warm, dry and intact. No rash noted.  Psychiatric: Mood and affect are anxious and appears to be acively hallucinating  ____________________________________________   LABS (all labs ordered are listed, but only abnormal results are displayed)  Results for orders placed or performed during the hospital encounter of 11/27/18 (from the past 24 hour(s))  CBC with Differential/Platelet     Status: Abnormal   Collection Time: 11/27/18 11:22 AM  Result Value Ref Range   WBC 14.2 (H) 4.0 - 10.5 K/uL   RBC 4.83 3.87 - 5.11 MIL/uL   Hemoglobin 14.0 12.0 - 15.0 g/dL   HCT 43.3 36.0 - 46.0 %   MCV 89.6 80.0 - 100.0 fL   MCH 29.0 26.0 - 34.0 pg   MCHC 32.3 30.0 - 36.0 g/dL   RDW 12.5 11.5 - 15.5 %   Platelets 753 (H) 150 - 400 K/uL   nRBC 0.0 0.0 - 0.2 %   Neutrophils Relative % 82 %   Neutro Abs 11.5 (H) 1.7 -  7.7 K/uL   Lymphocytes Relative 6 %   Lymphs Abs 0.9 0.7 - 4.0 K/uL   Monocytes Relative 11 %   Monocytes Absolute 1.6 (H) 0.1 - 1.0 K/uL   Eosinophils Relative 0 %   Eosinophils Absolute 0.0 0.0 - 0.5 K/uL   Basophils Relative 0 %   Basophils Absolute 0.1 0.0 - 0.1 K/uL   Immature Granulocytes 1 %   Abs Immature Granulocytes 0.17 (H) 0.00 - 0.07 K/uL  Comprehensive metabolic panel     Status: Abnormal   Collection Time: 11/27/18 11:22 AM  Result Value Ref Range   Sodium 135 135 - 145 mmol/L   Potassium 3.5 3.5 - 5.1 mmol/L   Chloride 100 98 - 111 mmol/L   CO2 28 22 - 32 mmol/L   Glucose, Bld 131 (H) 70 - 99 mg/dL   BUN 21 8 - 23 mg/dL   Creatinine, Ser 0.57 0.44 - 1.00 mg/dL   Calcium 8.6 (L) 8.9 - 10.3 mg/dL   Total Protein 6.6 6.5 - 8.1 g/dL   Albumin 4.1 3.5 - 5.0 g/dL   AST 28 15 - 41 U/L   ALT 15 0 - 44 U/L   Alkaline Phosphatase 52 38 - 126 U/L   Total Bilirubin 0.7 0.3 - 1.2 mg/dL   GFR calc non Af Amer >60 >60 mL/min   GFR calc Af Amer >60 >60 mL/min   Anion gap 7 5 - 15  Urinalysis, Complete w Microscopic     Status: Abnormal   Collection Time: 11/27/18 11:22 AM  Result Value Ref Range   Color, Urine AMBER (A) YELLOW   APPearance CLEAR (A) CLEAR   Specific Gravity, Urine 1.020 1.005 - 1.030   pH 5.0 5.0 - 8.0   Glucose, UA NEGATIVE NEGATIVE mg/dL   Hgb urine dipstick NEGATIVE NEGATIVE   Bilirubin Urine NEGATIVE NEGATIVE   Ketones, ur NEGATIVE NEGATIVE mg/dL   Protein, ur 30 (A) NEGATIVE mg/dL   Nitrite NEGATIVE NEGATIVE   Leukocytes, UA NEGATIVE NEGATIVE   RBC / HPF 0-5 0 - 5 RBC/hpf   WBC, UA 0-5 0 - 5 WBC/hpf   Bacteria, UA NONE SEEN NONE SEEN   Squamous Epithelial / LPF 0-5 0 - 5   Mucus PRESENT   Influenza panel by PCR (type A & B)     Status: None   Collection Time: 11/27/18  1:42 PM  Result Value Ref Range   Influenza A By PCR NEGATIVE NEGATIVE   Influenza B By PCR NEGATIVE NEGATIVE  ____________________________________________  EKG My  review and personal interpretation at Time: 10:00   Indication: ams  Rate: 70  Rhythm: sinus Axis: normal Other: normal intervas, rbbb, no stemi ____________________________________________  RADIOLOGY  I personally reviewed all radiographic images ordered to evaluate for the above acute complaints and reviewed radiology reports and findings.  These findings were personally discussed with the patient.  Please see medical record for radiology report.  ____________________________________________   PROCEDURES  Procedure(s) performed:  .Critical Care Performed by: Merlyn Lot, MD Authorized by: Merlyn Lot, MD   Critical care provider statement:    Critical care time (minutes):  30   Critical care time was exclusive of:  Separately billable procedures and treating other patients   Critical care was necessary to treat or prevent imminent or life-threatening deterioration of the following conditions:  CNS failure or compromise   Critical care was time spent personally by me on the following activities:  Development of treatment plan with patient or surrogate, discussions with consultants, evaluation of patient's response to treatment, examination of patient, obtaining history from patient or surrogate, ordering and performing treatments and interventions, ordering and review of laboratory studies, ordering and review of radiographic studies, pulse oximetry, re-evaluation of patient's condition and review of old charts      Critical Care performed: yes ____________________________________________   INITIAL IMPRESSION / [place] / ED COURSE  Pertinent labs & imaging results that were available during my care of the patient were reviewed by me and considered in my medical decision making (see chart for details).   DDX: Dehydration, sepsis, pna, uti, hypoglycemia, cva, drug effect, withdrawal,   Shelly Padilla is a 84 y.o. who presents to the ED with confusion and  hallucinations as described above.  Patient currently with low temperature.  Will be ordered for the above differential.  No report of any interval falls.  Will repeat blood work from yesterday to evaluate for any metabolic changes.  The patient will be placed on continuous pulse oximetry and telemetry for monitoring.  Laboratory evaluation will be sent to evaluate for the above complaints.     Clinical Course as of Nov 28 1555  Sun Nov 27, 2018  1307 No evidence of acute cystitis on urinalysis.  Will check influenza, chest x-ray as well as blood gas.   [PR]  1413 Patient reassessed.  Having active hallucinations.  Reporting persistent discomfort and dysuria but urinalysis does not show any evidence of bacteria.  Does have increasing leukocytosis as compared to yesterday which may be secondary to fall but will check core temperature.   [PR]    Clinical Course User Index [PR] Merlyn Lot, MD     As part of my medical decision making, I reviewed the following data within the [facility] notes reviewed and incorporated, Labs reviewed, notes from prior ED visits.  ____________________________________________   FINAL CLINICAL IMPRESSION(S) / ED DIAGNOSES  Final diagnoses:  Delirium      NEW MEDICATIONS STARTED DURING THIS VISIT:  New Prescriptions   No medications on file     Note:  This document was prepared using Dragon voice recognition software and may include unintentional dictation errors.      Merlyn Lot, MD 11/27/18 1556

## 2018-11-27 NOTE — ED Notes (Signed)
Patient transported to X-ray 

## 2018-11-27 NOTE — ED Notes (Signed)
Report given to Coral Ceo RN

## 2018-11-27 NOTE — ED Notes (Signed)
Patient given a warm blanket and pulse ox replaced on finger. Patient's son is at bedside.

## 2018-11-27 NOTE — Progress Notes (Signed)
Levaquin changed to 750 mg q 48 hours for CrCl ~41 per protocol

## 2018-11-27 NOTE — ED Notes (Signed)
Sent BCx2 and labwork to lab.

## 2018-11-27 NOTE — ED Notes (Addendum)
ED TO INPATIENT HANDOFF REPORT  Name/Age/Gender Shelly Padilla 84 y.o. female  Code Status Code Status History    Date Active Date Inactive Code Status Order ID Comments User Context   11/26/2017 1937 11/27/2017 2013 Full Code [id number]  Demetrios Loll, MD Inpatient      Home/SNF/Other [facility]  Chief Complaint UTI  Level of Care/Admitting Diagnosis ED Disposition    ED Disposition Condition [place]: [place] [100120]  Level of Care: Med-Surg [16]  Diagnosis: AMS (altered mental status) [6314970]  Admitting Physician: [place], [place]  Attending Physician: Quintella Baton [[id number]]  Estimated length of stay: past midnight tomorrow  Certification:: I certify this patient will need inpatient services for at least 2 midnights  PT Class (Do Not Modify): Inpatient [101]  PT Acc Code (Do Not Modify): Private [1]       Medical History Past Medical History:  Diagnosis Date  . Atrial fibrillation ([place])   . Brain tumor ([place])    meningioma  . Glaucoma   . H/O paroxysmal supraventricular tachycardia    documented by Holter Monitor  . Hemorrhoids   . Hiatal hernia   . History of ovarian cyst   . HTN (hypertension)   . Insomnia   . Palpitations   . Thrombocytosis ([facility])     Allergies Allergies  Allergen Reactions  . Amoxil [Amoxicillin] Anxiety  . Codeine   . Pacerone  [Amiodarone Hcl] Nausea And Vomiting  . Sulfa Drugs Cross Reactors   . Amlodipine Other (See Comments)    Mild edema    IV Location/Drains/Wounds Patient Lines/Drains/Airways Status   Active Line/Drains/Airways    Name:   Placement date:   Placement time:   Site:   Days:   Peripheral IV 11/27/18 Right Antecubital   11/27/18    1100    Antecubital   less than 1          Labs/Imaging Results for orders placed or performed during the hospital encounter of 11/27/18 (from the past 48 hour(s))  CBC with Differential/Platelet     Status: Abnormal    Collection Time: 11/27/18 11:22 AM  Result Value Ref Range   WBC 14.2 (H) 4.0 - 10.5 K/uL   RBC 4.83 3.87 - 5.11 MIL/uL   Hemoglobin 14.0 12.0 - 15.0 g/dL   HCT 43.3 36.0 - 46.0 %   MCV 89.6 80.0 - 100.0 fL   MCH 29.0 26.0 - 34.0 pg   MCHC 32.3 30.0 - 36.0 g/dL   RDW 12.5 11.5 - 15.5 %   Platelets 753 (H) 150 - 400 K/uL   nRBC 0.0 0.0 - 0.2 %   Neutrophils Relative % 82 %   Neutro Abs 11.5 (H) 1.7 - 7.7 K/uL   Lymphocytes Relative 6 %   Lymphs Abs 0.9 0.7 - 4.0 K/uL   Monocytes Relative 11 %   Monocytes Absolute 1.6 (H) 0.1 - 1.0 K/uL   Eosinophils Relative 0 %   Eosinophils Absolute 0.0 0.0 - 0.5 K/uL   Basophils Relative 0 %   Basophils Absolute 0.1 0.0 - 0.1 K/uL   Immature Granulocytes 1 %   Abs Immature Granulocytes 0.17 (H) 0.00 - 0.07 K/uL    Comment: Performed at [facility], [address] [place], [place] [address]  Comprehensive metabolic panel     Status: Abnormal   Collection Time: 11/27/18 11:22 AM  Result Value Ref Range   Sodium 135 135 - 145 mmol/L  Potassium 3.5 3.5 - 5.1 mmol/L   Chloride 100 98 - 111 mmol/L   CO2 28 22 - 32 mmol/L   Glucose, Bld 131 (H) 70 - 99 mg/dL   BUN 21 8 - 23 mg/dL   Creatinine, Ser 0.57 0.44 - 1.00 mg/dL   Calcium 8.6 (L) 8.9 - 10.3 mg/dL   Total Protein 6.6 6.5 - 8.1 g/dL   Albumin 4.1 3.5 - 5.0 g/dL   AST 28 15 - 41 U/L   ALT 15 0 - 44 U/L   Alkaline Phosphatase 52 38 - 126 U/L   Total Bilirubin 0.7 0.3 - 1.2 mg/dL   GFR calc non Af Amer >60 >60 mL/min   GFR calc Af Amer >60 >60 mL/min   Anion gap 7 5 - 15    Comment: Performed at [facility], [address] [place], [place] [address]  Urinalysis, Complete w Microscopic     Status: Abnormal   Collection Time: 11/27/18 11:22 AM  Result Value Ref Range   Color, Urine AMBER (A) YELLOW    Comment: BIOCHEMICALS MAY BE AFFECTED BY COLOR   APPearance CLEAR (A) CLEAR   Specific Gravity, Urine 1.020 1.005 - 1.030   pH 5.0 5.0 - 8.0   Glucose, UA  NEGATIVE NEGATIVE mg/dL   Hgb urine dipstick NEGATIVE NEGATIVE   Bilirubin Urine NEGATIVE NEGATIVE   Ketones, ur NEGATIVE NEGATIVE mg/dL   Protein, ur 30 (A) NEGATIVE mg/dL   Nitrite NEGATIVE NEGATIVE   Leukocytes, UA NEGATIVE NEGATIVE   RBC / HPF 0-5 0 - 5 RBC/hpf   WBC, UA 0-5 0 - 5 WBC/hpf   Bacteria, UA NONE SEEN NONE SEEN   Squamous Epithelial / LPF 0-5 0 - 5   Mucus PRESENT     Comment: Performed at [facility], [address], [place], [place] [address]  Influenza panel by PCR (type A & B)     Status: None   Collection Time: 11/27/18  1:42 PM  Result Value Ref Range   Influenza A By PCR NEGATIVE NEGATIVE   Influenza B By PCR NEGATIVE NEGATIVE    Comment: (NOTE) The Xpert Xpress Flu assay is intended as an aid in the diagnosis of  influenza and should not be used as a sole basis for treatment.  This  assay is FDA approved for nasopharyngeal swab specimens only. Nasal  washings and aspirates are unacceptable for Xpert Xpress Flu testing. Performed at [facility], [address] [place], [place] [address]   Blood gas, arterial     Status: Abnormal   Collection Time: 11/27/18  4:56 PM  Result Value Ref Range   FIO2 0.21    pH, Arterial 7.48 (H) 7.350 - 7.450   pCO2 arterial 39 32.0 - 48.0 mmHg   pO2, Arterial 78 (L) 83.0 - 108.0 mmHg   Bicarbonate 29.0 (H) 20.0 - 28.0 mmol/L   Acid-Base Excess 5.2 (H) 0.0 - 2.0 mmol/L   O2 Saturation 96.3 %   Patient temperature 37.0    Collection site RIGHT RADIAL    Sample type ARTERIAL DRAW     Comment: Performed at [facility], [address] [place], [place] [address]  Lactic acid, plasma     Status: None   Collection Time: 11/27/18  5:03 PM  Result Value Ref Range   Lactic Acid, Venous 1.3 0.5 - 1.9 mmol/L    Comment: Performed at [facility], [address] [place], [place] [address]  TSH  Status: None   Collection Time: 11/27/18  5:03 PM  Result Value Ref Range   TSH 2.017  0.350 - 4.500 uIU/mL    Comment: Performed by a 3rd Generation assay with a functional sensitivity of <=0.01 uIU/mL. Performed at [facility], [address] [place], [place] [address]    Dg Chest 1 View  Result Date: 11/27/2018 CLINICAL DATA:  Altered mental status. EXAM: CHEST  1 VIEW COMPARISON:  July 22, 2016 and November 26, 2017 FINDINGS: Minimal atelectasis in the left base. The heart, hila, mediastinum, lungs, and pleura are otherwise unremarkable and unchanged. IMPRESSION: Minimal atelectasis in the left base.  No other acute abnormalities. Electronically Signed   By: Dorise Bullion III M.D   On: 11/27/2018 14:24   Ct Head Wo Contrast  Result Date: 11/27/2018 CLINICAL DATA:  Confusion, hallucinations. EXAM: CT HEAD WITHOUT CONTRAST TECHNIQUE: Contiguous axial images were obtained from the base of the skull through the vertex without intravenous contrast. COMPARISON:  11/26/2018 FINDINGS: Brain: There is a slightly hyperdense rounded structure/mass in the right temporal occipital region measuring up to 3.1 cm compatible with meningioma, stable since prior study. There is atrophy and chronic small vessel disease changes. No hemorrhage, hydrocephalus or acute infarction. Vascular: No hyperdense vessel or unexpected calcification. Skull: No acute calvarial abnormality. Sinuses/Orbits: Visualized paranasal sinuses and mastoids clear. Orbital soft tissues unremarkable. Other: None IMPRESSION: 3 cm right temporal occipital meningioma, stable. Atrophy, chronic small vessel disease. No acute intracranial abnormality. Electronically Signed   By: Rolm Baptise M.D.   On: 11/27/2018 16:49   Ct Head Wo Contrast  Result Date: 11/26/2018 CLINICAL DATA:  84 year old female with altered mental status, multiple recent falls EXAM: CT HEAD WITHOUT CONTRAST TECHNIQUE: Contiguous axial images were obtained from the base of the skull through the vertex without intravenous contrast. COMPARISON:   Prior head CT 11/26/2017; brain MRI 11/26/2017 FINDINGS: Brain: No evidence of acute infarction, hemorrhage, hydrocephalus, extra-axial collection or mass lesion/mass effect. Stable approximately 3 cm hyperdense rounded soft tissue structure arising from the dura of the right temporal occipital region. Correlation with prior MR imaging demonstrates a meningioma in the same location. No significant interval change. Similar degree of cerebral and cerebellar cortical atrophy. Periventricular, subcortical and deep white matter hypoattenuation are also stable and remain consistent with chronic microvascular ischemic white matter disease. Vascular: Atherosclerotic calcifications in the bilateral cavernous and supraclinoid internal carotid arteries. Skull: Normal. Negative for fracture or focal lesion. Sinuses/Orbits: No acute finding. Other: None. IMPRESSION: 1. No acute intracranial abnormality. 2. Stable approximately 3 cm right temporal occipital meningioma. 3. Similar degree of atrophy and chronic microvascular ischemic white matter disease. Electronically Signed   By: Jacqulynn Cadet M.D.   On: 11/26/2018 10:49   Ct Pelvis Wo Contrast  Result Date: 11/26/2018 CLINICAL DATA:  84 year old female with multiple recent falls and right hip bruising. EXAM: CT PELVIS WITHOUT CONTRAST TECHNIQUE: Multidetector CT imaging of the pelvis was performed following the standard protocol without intravenous contrast. COMPARISON:  Recent prior CT scan of the abdomen and pelvis 07/03/2018 and 10/27/2013 FINDINGS: Urinary Tract: Limited evaluation in the absence of intravenous contrast. Unremarkable appearance of the bladder. Bowel:  No focal bowel wall thickening or evidence of obstruction. Vascular/Lymphatic: Limited evaluation in the absence of intravenous contrast. Atherosclerotic vascular calcifications are noted throughout the aorta and branch vessels. Reproductive: Unremarkable uterus. Multiple circumscribed low-attenuation  cystic lesions affiliated with both ovaries. The largest affiliated with the right ovary remains stable at approximately 3 cm.  The smaller cystic lesions remain unchanged dating back to November of 2014. Other:  None. Musculoskeletal: Advanced lower lumbar degenerative disc disease. No definite acute fracture or malalignment. IMPRESSION: 1. No acute fracture identified. Please note that in the setting of generalized osteopenia, nondisplaced fractures may remain occult by CT imaging. If there is persistent clinical concern (patient unable to bear weight) consider further evaluation with MRI. 2. Multiple ovarian cysts which remain grossly stable across multiple prior studies. 3.  Aortic Atherosclerosis (ICD10-170.0). 4. Advanced lower lumbar degenerative disc disease. Electronically Signed   By: Jacqulynn Cadet M.D.   On: 11/26/2018 13:39   Dg Chest Portable 1 View  Result Date: 11/26/2018 CLINICAL DATA:  Confusion and hypoxia.  Multiple falls. EXAM: PORTABLE CHEST 1 VIEW COMPARISON:  07/22/2016 FINDINGS: Normal heart size. Decreased lung volumes. There is no pleural effusion or edema. No airspace opacities noted. IMPRESSION: 1. Lungs are hypoinflated but clear. Electronically Signed   By: Kerby Moors M.D.   On: 11/26/2018 10:45   Dg Hip Unilat W Or Wo Pelvis 2-3 Views Right  Result Date: 11/26/2018 CLINICAL DATA:  84 year old female with right hip bruise EXAM: DG HIP (WITH OR WITHOUT PELVIS) 2-3V RIGHT COMPARISON:  Prior CT abdomen/pelvis 07/03/2018 FINDINGS: The bones appear diffusely osteopenic. No evidence of acute fracture or malalignment. The bony pelvis is intact. Multiple vascular phleboliths project over the pelvis. The right femoral head is located. IMPRESSION: 1. No evidence of acute fracture or malalignment. 2. The bones appear demineralized. 3. Mild atherosclerotic vascular calcifications. Electronically Signed   By: Jacqulynn Cadet M.D.   On: 11/26/2018 12:27    Pending  Labs Unresulted Labs (From admission, onward)    Start     Ordered   11/27/18 1644  Vitamin B12  Add-on,   AD     11/27/18 1643   11/27/18 1643  Culture, blood (Routine X 2) w Reflex to ID Panel  BLOOD CULTURE X 2,   STAT     11/27/18 1643   11/27/18 1643  RPR  Add-on,   AD     11/27/18 1643   Signed and Held  CBC  (heparin)  Once,   R    Comments:  Baseline for heparin therapy IF NOT ALREADY DRAWN.  Notify MD if PLT < 100 K.    Signed and Held   Signed and Held  Creatinine, serum  (heparin)  Once,   R    Comments:  Baseline for heparin therapy IF NOT ALREADY DRAWN.    Signed and Held   Signed and Held  Basic metabolic panel  Tomorrow morning,   R     Signed and Held   Signed and Held  Magnesium  Tomorrow morning,   R     Signed and Held   Signed and Held  CBC  Tomorrow morning,   R     Signed and Held          Vitals/Pain Today's Vitals   11/27/18 1730 11/27/18 1800 11/27/18 1830 11/27/18 1900  BP: 109/63 (!) 124/59 137/60 139/78  Pulse:   81 89  Resp:      Temp:      TempSrc:      SpO2:   95% 96%  Weight:      Height:      PainSc:        Isolation Precautions Droplet precaution  Medications Medications  sodium chloride 0.9 % bolus 500 mL (0 mLs Intravenous Stopped 11/27/18 1605)  iopamidol (  ISOVUE-300) 61 % injection 100 mL (100 mLs Intravenous Contrast Given 11/27/18 1630)  LORazepam (ATIVAN) injection 1 mg (1 mg Intravenous Given 11/27/18 1516)    Mobility walks with device

## 2018-11-28 LAB — BASIC METABOLIC PANEL
Anion gap: 8 (ref 5–15)
BUN: 15 mg/dL (ref 8–23)
CO2: 26 mmol/L (ref 22–32)
Calcium: 8.3 mg/dL — ABNORMAL LOW (ref 8.9–10.3)
Chloride: 102 mmol/L (ref 98–111)
Creatinine, Ser: 0.34 mg/dL — ABNORMAL LOW (ref 0.44–1.00)
GFR calc Af Amer: >60 mL/min (ref >60)
GFR calc non Af Amer: >60 mL/min (ref >60)
Glucose, Bld: 101 mg/dL — ABNORMAL HIGH (ref 70–99)
Potassium: 3.4 mmol/L — ABNORMAL LOW (ref 3.5–5.1)
Sodium: 136 mmol/L (ref 135–145)

## 2018-11-28 LAB — CBC
HCT: 42.5 % (ref 36.0–46.0)
Hemoglobin: 14.1 g/dL (ref 12.0–15.0)
MCH: 29.5 pg (ref 26.0–34.0)
MCHC: 33.2 g/dL (ref 30.0–36.0)
MCV: 88.9 fL (ref 80.0–100.0)
PLATELETS: 618 10*3/uL — AB (ref 150–400)
RBC: 4.78 MIL/uL (ref 3.87–5.11)
RDW: 12.5 % (ref 11.5–15.5)
WBC: 12 10*3/uL — ABNORMAL HIGH (ref 4.0–10.5)
nRBC: 0 % (ref 0.0–0.2)

## 2018-11-28 LAB — VITAMIN B12: Vitamin B-12: 385 pg/mL (ref 180–914)

## 2018-11-28 LAB — MAGNESIUM: Magnesium: 1.9 mg/dL (ref 1.7–2.4)

## 2018-11-28 MED ORDER — TIMOLOL MALEATE 0.5 % OP SOLN
1.0000 [drp] | Freq: Every day | OPHTHALMIC | Status: DC
  Administered 2018-11-28 – 2018-11-30 (×3): 1 [drp] via OPHTHALMIC
  Filled 2018-11-28: qty 5

## 2018-11-28 MED ORDER — ORAL CARE MOUTH RINSE
15.0000 mL | Freq: Two times a day (BID) | OROMUCOSAL | Status: DC
  Administered 2018-11-28 – 2018-11-30 (×6): 15 mL via OROMUCOSAL

## 2018-11-28 NOTE — Progress Notes (Signed)
[facility] NAME: Shelly Padilla    MR#:  [id number]  DATE OF BIRTH:  [birth year]  SUBJECTIVE:  CHIEF COMPLAINT: Patient is more awake and alert but has underlying dementia.  Son at bedside answering most of the questions and he thinks mom is clinically getting better  REVIEW OF SYSTEMS:   Review system unobtainable DRUG ALLERGIES:   Allergies  Allergen Reactions  . Amoxil [Amoxicillin] Anxiety  . Codeine   . Pacerone  [Amiodarone Hcl] Nausea And Vomiting  . Sulfa Drugs Cross Reactors   . Amlodipine Other (See Comments)    Mild edema    VITALS:  Blood pressure (!) 166/82, pulse 76, temperature (!) 97.4 F (36.3 C), temperature source Oral, resp. rate 18, height 5\' 8"  (1.727 m), weight 62.4 kg, SpO2 96 %.  PHYSICAL EXAMINATION:  GENERAL:  84 y.o.-year-old patient lying in the bed with no acute distress.  EYES: Pupils equal, round, reactive to light and accommodation. No scleral icterus. Extraocular muscles intact.  HEENT: Head atraumatic, normocephalic. Oropharynx and nasopharynx clear.  NECK:  Supple, no jugular venous distention. No thyroid enlargement, no tenderness.  LUNGS: Normal breath sounds bilaterally, no wheezing, rales,rhonchi or crepitation. No use of accessory muscles of respiration.  CARDIOVASCULAR: S1, S2 normal. No murmurs, rubs, or gallops.  ABDOMEN: Soft, nontender, nondistended. Bowel sounds present. EXTREMITIES: No pedal edema, cyanosis, or clubbing.  NEUROLOGIC: Awake and alert, oriented x1-. Sensation intact. Gait not checked.  PSYCHIATRIC: The patient is alert and oriented x 1.  SKIN: No obvious rash, lesion, or ulcer.    LABORATORY PANEL:   CBC Recent Labs  Lab 11/28/18 0421  WBC 12.0*  HGB 14.1  HCT 42.5  PLT 618*   ------------------------------------------------------------------------------------------------------------------  Chemistries  Recent Labs  Lab 11/27/18 1122  11/28/18 0421  NA 135 136  K 3.5 3.4*  CL 100 102  CO2 28 26  GLUCOSE 131* 101*  BUN 21 15  CREATININE 0.57 0.34*  CALCIUM 8.6* 8.3*  MG  --  1.9  AST 28  --   ALT 15  --   ALKPHOS 52  --   BILITOT 0.7  --    ------------------------------------------------------------------------------------------------------------------  Cardiac Enzymes Recent Labs  Lab 11/26/18 1011  TROPONINI <0.03   ------------------------------------------------------------------------------------------------------------------  RADIOLOGY:  Dg Chest 1 View  Result Date: 11/27/2018 CLINICAL DATA:  Altered mental status. EXAM: CHEST  1 VIEW COMPARISON:  July 22, 2016 and November 26, 2017 FINDINGS: Minimal atelectasis in the left base. The heart, hila, mediastinum, lungs, and pleura are otherwise unremarkable and unchanged. IMPRESSION: Minimal atelectasis in the left base.  No other acute abnormalities. Electronically Signed   By: Dorise Bullion III M.D   On: 11/27/2018 14:24   Ct Head Wo Contrast  Result Date: 11/27/2018 CLINICAL DATA:  Confusion, hallucinations. EXAM: CT HEAD WITHOUT CONTRAST TECHNIQUE: Contiguous axial images were obtained from the base of the skull through the vertex without intravenous contrast. COMPARISON:  11/26/2018 FINDINGS: Brain: There is a slightly hyperdense rounded structure/mass in the right temporal occipital region measuring up to 3.1 cm compatible with meningioma, stable since prior study. There is atrophy and chronic small vessel disease changes. No hemorrhage, hydrocephalus or acute infarction. Vascular: No hyperdense vessel or unexpected calcification. Skull: No acute calvarial abnormality. Sinuses/Orbits: Visualized paranasal sinuses and mastoids clear. Orbital soft tissues unremarkable. Other: None IMPRESSION: 3 cm right temporal occipital meningioma, stable. Atrophy, chronic small vessel disease. No acute intracranial abnormality. Electronically Signed  By: Rolm Baptise M.D.   On: 11/27/2018 16:49   Ct Abdomen Pelvis W Contrast  Result Date: 11/27/2018 CLINICAL DATA:  Abdominal pain. Possible UTI. EXAM: CT ABDOMEN AND PELVIS WITH CONTRAST TECHNIQUE: Multidetector CT imaging of the abdomen and pelvis was performed using the standard protocol following bolus administration of intravenous contrast. CONTRAST:  122mL ISOVUE-300 IOPAMIDOL (ISOVUE-300) INJECTION 61% COMPARISON:  CT abdomen pelvis 11/26/2018 FINDINGS: LOWER CHEST: There is no basilar pleural or apical pericardial effusion. HEPATOBILIARY: The hepatic contours and density are normal. There is no intra- or extrahepatic biliary dilatation. The gallbladder is normal. PANCREAS: The pancreatic parenchymal contours are normal and there is no ductal dilatation. There is no peripancreatic fluid collection. SPLEEN: Normal. ADRENALS/URINARY TRACT: --Adrenal glands: Normal. --Right kidney/ureter: No hydronephrosis, nephroureterolithiasis, perinephric stranding or solid renal mass. --Left kidney/ureter: There is an interpolar cyst measuring 3.7 cm. No hydronephrosis, nephroureterolithiasis, perinephric stranding or solid renal mass. --Urinary bladder: Normal for degree of distention STOMACH/BOWEL: --Stomach/Duodenum: There is no hiatal hernia or other gastric abnormality. The duodenal course and caliber are normal. --Small bowel: No dilatation or inflammation. --Colon: No focal abnormality. --Appendix: Normal. VASCULAR/LYMPHATIC: Atherosclerotic calcification is present within the non-aneurysmal abdominal aorta, without hemodynamically significant stenosis. No abdominal or pelvic lymphadenopathy. REPRODUCTIVE: Normal uterus and ovaries. MUSCULOSKELETAL. Multilevel degenerative disc disease and facet arthrosis. No bony spinal canal stenosis. OTHER: None. IMPRESSION: No acute abdominal or pelvic abnormality. Aortic atherosclerosis (ICD10-I70.0). Electronically Signed   By: Ulyses Jarred M.D.   On: 11/27/2018 17:03    EKG:    Orders placed or performed during the hospital encounter of 11/26/18  . ED EKG  . ED EKG  . EKG 12-Lead  . EKG 12-Lead  . EKG    ASSESSMENT AND PLAN:    #Altered mental status-probably from dehydration Clinically better than yesterday Gentle hydration with IV fluids Neurochecks  #Possible sepsis Blood cultures and urine cultures Empiric IV levofloxacin Lactic acid 1.3 Flu test is negative TSH is normal  #History of dementia Continue close monitoring  #Essential hypertension Blood pressure is elevated Continue home medication metoprolol, ramipril titrate as needed   Disposition [facility] when clinically stable  All the records are reviewed and case discussed with Care Management/Social Workerr. Management plans discussed with the patient, son and they are in agreement.  CODE STATUS: DNR, son Cecilie Lowers is the healthcare power of attorney  TOTAL TIME TAKING CARE OF THIS PATIENT: 33minutes.   POSSIBLE D/C IN 1-2 DAYS, DEPENDING ON CLINICAL CONDITION.  Note: This dictation was prepared with Dragon dictation along with smaller phrase technology. Any transcriptional errors that result from this process are unintentional.   Nicholes Mango M.D on 11/28/2018 at 10:38 PM  Between 7am to 6pm - Pager - [phone] After 6pm go to [email or web address] - password [email or web address] [facility]  Office  [phone]  CC: Primary care physician; Venia Carbon, MD

## 2018-11-28 NOTE — NC FL2 (Signed)
[place] LEVEL OF CARE SCREENING TOOL     IDENTIFICATION  Patient Name: Shelly Padilla Birthdate: 1920-08-09 Sex: female Admission Date (Current Location): 11/27/2018  [place] and [place] Number:  Engineering geologist and Address:  [facility], [address] [place], [place] [address]      Provider Number: [id number]  Attending Physician Name and Address:  Nicholes Mango, MD  Relative Name and Phone Number:       Current Level of Care: Hospital Recommended Level of Care: [place] Prior Approval Number:    Date Approved/Denied:   PASRR Number: [id number]  Discharge Plan: SNF    Current Diagnoses: Patient Active Problem List   Diagnosis Date Noted  . Altered mental status 11/27/2018  . Atrial fibrillation, chronic 11/27/2018  . AMS (altered mental status) 11/27/2018  . MDD (major depressive disorder), single episode, moderate ([place]) 09/08/2018  . Duodenitis 07/14/2018  . 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 Carotid artery disease ([place]) 03/03/2018  . TIA (transient ischemic attack) 11/26/2017  . Chronic venous insufficiency 06/09/2016  . Right inguinal hernia 06/09/2016  . DD (diverticular disease) 04/10/2016  . Glaucoma 04/10/2016  . Hemorrhoid 04/10/2016  . Bilateral ovarian cysts 03/18/2016  . Thrombocytosis ([place])   . Preventative health care 06/28/2015  . Varicose veins 01/31/2015  . Advance directive discussed with patient 12/26/2014  . Meningioma ([place]) 10/01/2014  . Insomnia 05/18/2014  . Episodic mood disorder ([place]) 10/18/2012  . Paroxysmal supraventricular tachycardia ([place]) 03/08/2012  . HTN (hypertension) 03/08/2012    Orientation RESPIRATION BLADDER Height & Weight     Self, Place  Normal Incontinent Weight: 137 lb 9.6 oz (62.4 kg) Height:  5\' 8"  (172.7 cm)  BEHAVIORAL SYMPTOMS/MOOD NEUROLOGICAL BOWEL NUTRITION STATUS  (none) (none) Continent Diet(Regular )  AMBULATORY STATUS COMMUNICATION OF  NEEDS Skin   Extensive Assist Verbally Normal                       Personal Care Assistance Level of Assistance  Bathing, Feeding, Dressing Bathing Assistance: Limited assistance Feeding assistance: Independent Dressing Assistance: Limited assistance     Functional Limitations Info  Sight, Hearing, Speech Sight Info: Adequate Hearing Info: Adequate Speech Info: Adequate    SPECIAL CARE FACTORS FREQUENCY  PT (By licensed PT), OT (By licensed OT)     PT Frequency: 5 OT Frequency: 5            Contractures Contractures Info: Not present    Additional Factors Info  Code Status, Allergies Code Status Info: DNR Allergies Info: Amoxil Amoxicillin, Codeine, Pacerone  Amiodarone Hcl, Sulfa Drugs Cross Reactors, Amlodipine           Current Medications (11/28/2018):  This is the current hospital active medication list Current Facility-Administered Medications  Medication Dose Route Frequency Provider Last Rate Last Dose  . 0.9 % NaCl with KCl 20 mEq/ L  infusion   Intravenous Continuous Quintella Baton, MD 75 mL/hr at 11/28/18 0015    . acetaminophen (TYLENOL) tablet 650 mg  650 mg Oral Q6H PRN Crosley, Debby, MD       Or  . acetaminophen (TYLENOL) suppository 650 mg  650 mg Rectal Q6H PRN Crosley, Debby, MD      . bismuth subsalicylate (PEPTO BISMOL) 262 MG/15ML suspension 10 mL  10 mL Oral TID AC & HS Crosley, Debby, MD      . famotidine (PEPCID) tablet 20 mg  20 mg Oral BID Quintella Baton, MD      .  heparin injection 5,000 Units  5,000 Units Subcutaneous Q8H Quintella Baton, MD   5,000 Units at 11/28/18 0644  . levofloxacin (LEVAQUIN) IVPB 750 mg  750 mg Intravenous Q48H Quintella Baton, MD   Stopped at 11/28/18 0120  . MEDLINE mouth rinse  15 mL Mouth Rinse BID Harrie Foreman, MD   15 mL at 11/28/18 0932  . metoprolol succinate (TOPROL-XL) 24 hr tablet 50 mg  50 mg Oral Q supper Crosley, Debby, MD      . ondansetron (ZOFRAN) tablet 4 mg  4 mg Oral Q6H PRN Crosley,  Debby, MD       Or  . ondansetron (ZOFRAN) injection 4 mg  4 mg Intravenous Q6H PRN Crosley, Debby, MD      . ramipril (ALTACE) capsule 10 mg  10 mg Oral Daily Crosley, Debby, MD      . timolol (TIMOPTIC) 0.5 % ophthalmic solution 1 drop  1 drop Both Eyes Daily Gouru, Aruna, MD         Discharge Medications: Please see discharge summary for a list of discharge medications.  Relevant Imaging Results:  Relevant Lab Results:   Additional Information SSN: [id number]  Annamaria Boots, [place]

## 2018-11-28 NOTE — Clinical Social Work Note (Signed)
Clinical Social Work Assessment  Patient Details  Name: Shelly Padilla MRN: [id number] Date of Birth: Nov 27, 1920  Date of referral:  11/28/18               Reason for consult:  Facility Placement                Permission sought to share information with:  Case Manager, Customer service manager, Family Supports Permission granted to share information::  Yes, Verbal Permission Granted  Name::        Agency::     Relationship::     Contact Information:     Housing/Transportation Living arrangements for the past 2 months:  [place] of Information:  Adult Children Patient Interpreter Needed:  None Criminal Activity/Legal Involvement Pertinent to Current Situation/Hospitalization:  No - Comment as needed Significant Relationships:  Adult Children, Friend Lives with:  Facility Resident Do you feel safe going back to the place where you live?  Yes Need for family participation in patient care:  Yes (Comment)  Care giving concerns:  Patient is a long term resident at [facility]    Social Worker assessment / plan:  CSW noted in chart review that patient is from [facility]. CSW met with patient and son Shelly Padilla at bedside. Son states that patient has lived at [facility] for 12 years and has been in the assisted living for about a year and a half. Son states that they would like patient to go to rehab at [facility]. CSW notified Seth Bake at [facility] of admission and desire to go to SNF. Seth Bake is aware and in agreement. CSW will follow for discharge planning.   Employment status:  Retired Forensic scientist:  Medicare PT Recommendations:  Not assessed at this time Information / Referral to community resources:  [place]  Patient/Family's Response to care:  Patient and son thanked CSW for assistance   Patient/Family's Understanding of and Emotional Response to Diagnosis, Current Treatment, and Prognosis:  Son states  understanding of current diagnoses and treatment.   Emotional Assessment Appearance:  Appears stated age Attitude/Demeanor/Rapport:    Affect (typically observed):  Pleasant, Quiet Orientation:  Oriented to Self, Oriented to Place Alcohol / Substance use:  Not Applicable Psych involvement (Current and /or in the community):  No (Comment)  Discharge Needs  Concerns to be addressed:  Discharge Planning Concerns Readmission within the last 30 days:  No Current discharge risk:  None Barriers to Discharge:  Continued Medical Work up   [employer], [place] 11/28/2018, 4:08 PM

## 2018-11-29 ENCOUNTER — Inpatient Hospital Stay: Payer: Medicare Other

## 2018-11-29 ENCOUNTER — Other Ambulatory Visit: Payer: Self-pay

## 2018-11-29 DIAGNOSIS — I63413 Cerebral infarction due to embolism of bilateral middle cerebral arteries: Secondary | ICD-10-CM

## 2018-11-29 DIAGNOSIS — R400 Somnolence: Secondary | ICD-10-CM

## 2018-11-29 LAB — CBC
HEMATOCRIT: 45.5 % (ref 36.0–46.0)
Hemoglobin: 15 g/dL (ref 12.0–15.0)
MCH: 29.2 pg (ref 26.0–34.0)
MCHC: 33 g/dL (ref 30.0–36.0)
MCV: 88.5 fL (ref 80.0–100.0)
NRBC: 0 % (ref 0.0–0.2)
Platelets: 714 10*3/uL — ABNORMAL HIGH (ref 150–400)
RBC: 5.14 MIL/uL — ABNORMAL HIGH (ref 3.87–5.11)
RDW: 12.5 % (ref 11.5–15.5)
WBC: 11.7 10*3/uL — ABNORMAL HIGH (ref 4.0–10.5)

## 2018-11-29 LAB — BASIC METABOLIC PANEL
Anion gap: 8 (ref 5–15)
BUN: 16 mg/dL (ref 8–23)
CO2: 24 mmol/L (ref 22–32)
Calcium: 8.5 mg/dL — ABNORMAL LOW (ref 8.9–10.3)
Chloride: 103 mmol/L (ref 98–111)
Creatinine, Ser: 0.44 mg/dL (ref 0.44–1.00)
GFR calc Af Amer: >60 mL/min (ref >60)
GFR calc non Af Amer: >60 mL/min (ref >60)
Glucose, Bld: 101 mg/dL — ABNORMAL HIGH (ref 70–99)
POTASSIUM: 4.1 mmol/L (ref 3.5–5.1)
Sodium: 135 mmol/L (ref 135–145)

## 2018-11-29 LAB — RPR: RPR Ser Ql: NONREACTIVE

## 2018-11-29 MED ORDER — NITROGLYCERIN 2 % TD OINT
0.5000 [in_us] | TOPICAL_OINTMENT | Freq: Four times a day (QID) | TRANSDERMAL | Status: DC
  Administered 2018-11-29 – 2018-11-30 (×6): 0.5 [in_us] via TOPICAL
  Filled 2018-11-29 (×6): qty 1

## 2018-11-29 MED ORDER — LEVOFLOXACIN 750 MG PO TABS
750.0000 mg | ORAL_TABLET | ORAL | Status: DC
  Administered 2018-11-29: 750 mg via ORAL
  Filled 2018-11-29: qty 1

## 2018-11-29 MED ORDER — ASPIRIN EC 81 MG PO TBEC
81.0000 mg | DELAYED_RELEASE_TABLET | Freq: Every day | ORAL | Status: DC
  Administered 2018-11-29 – 2018-11-30 (×2): 81 mg via ORAL
  Filled 2018-11-29 (×2): qty 1

## 2018-11-29 MED ORDER — HYDRALAZINE HCL 20 MG/ML IJ SOLN
10.0000 mg | Freq: Four times a day (QID) | INTRAMUSCULAR | Status: DC | PRN
  Administered 2018-11-29: 10 mg via INTRAVENOUS
  Filled 2018-11-29: qty 1

## 2018-11-29 NOTE — Evaluation (Signed)
Physical Therapy Evaluation Patient Details Name: Shelly Padilla MRN: [id number] DOB: 04/09/1920 Today's Date: 11/29/2018   History of Present Illness  presented to ER secondary to multiple recent falls, AMS; admitted for TIA/CVA work up.  MRI significant for subacute biparietal and R frontal lobe infarcts, suggestive of embolic source.  Clinical Impression  Upon evaluation, patient alert and oriented to self only; poor STM, often repeating self, asking similar questions throughout session.  Bilat UE/LE strength and ROM grossly symmetrical; no significant focal weakness, sensory or coordination deficit appreciated.  Currently able to complete bed mobility with min assist; sit/stand, basic transfers and gait (12') with RW, min/mod assist.  Excessive posterior weight shift, requiring hands-on assist from therapist for balance correction, walker management and overall safety awareness.  Unsafe to return to ALF apartment alone at this time. Would benefit from skilled PT to address above deficits and promote optimal return to PLOF; recommend transition to STR upon discharge from acute hospitalization.     Follow Up Recommendations SNF    Equipment Recommendations       Recommendations for Other Services       Precautions / Restrictions Precautions Precautions: Fall Restrictions Weight Bearing Restrictions: No      Mobility  Bed Mobility Overal bed mobility: Needs Assistance Bed Mobility: Supine to Sit     Supine to sit: Min assist        Transfers Overall transfer level: Needs assistance Equipment used: Rolling walker (2 wheeled) Transfers: Sit to/from Stand Sit to Stand: Min assist;Mod assist         General transfer comment: posterior trunk lean/weight shift  Ambulation/Gait Ambulation/Gait assistance: Min assist;Mod assist Gait Distance (Feet): 12 Feet Assistive device: Rolling walker (2 wheeled)       General Gait Details: assist for ant/lateral weight  shifting, balance in A/P plane, walker management and advancement; poor balance with turn negotiation; high fall risk.  Stairs            Wheelchair Mobility    Modified Rankin (Stroke Patients Only)       Balance Overall balance assessment: Needs assistance Sitting-balance support: No upper extremity supported;Feet supported Sitting balance-Leahy Scale: Fair     Standing balance support: Bilateral upper extremity supported Standing balance-Leahy Scale: Poor                               Pertinent Vitals/Pain Pain Assessment: No/denies pain    Home Living Family/patient expects to be discharged to:: [facility])   Available Help at Discharge: (ALF) Type of Home: Apartment       Home Layout: One level Home Equipment: Walker - 4 wheels      Prior Function Level of Independence: Needs assistance         Comments: Mod indep for transfers, gait with 4WRW; assist from staff as needed for ADLs.  Ambulates to/from dining hall for meals     Hand Dominance   Dominant Hand: Right    Extremity/Trunk Assessment   Upper Extremity Assessment Upper Extremity Assessment: Overall WFL for tasks assessed(grossly at least 4-/5 throughout; no significant focal weakness, sensory or coordination deficit appreciated)    Lower Extremity Assessment Lower Extremity Assessment: Overall WFL for tasks assessed(grossly at least 4-/5 throughout; no significant focal weakness, sensory or coordination deficit appreciated)       Communication   Communication: (extremely HOH; hears best from L ear)  Cognition Arousal/Alertness: Awake/alert Behavior During Therapy: [facility]  for tasks assessed/performed Overall Cognitive Status: Difficult to assess                                 General Comments: poor STM      General Comments      Exercises Other Exercises Other Exercises: Unsupported sitting and standing balance activities to promote anterior  weight translation, midline orientation in A/P plane, min/mod assist.  Poor self-awreness and righting noted   Assessment/Plan    PT Assessment Patient needs continued PT services  PT Problem List Decreased activity tolerance;Decreased balance;Decreased mobility;Decreased cognition;Decreased knowledge of use of DME;Decreased safety awareness;Decreased knowledge of precautions;Decreased coordination       PT Treatment Interventions DME instruction;Gait training;Functional mobility training;Neuromuscular re-education;Therapeutic exercise;Therapeutic activities;Patient/family education;Balance training;Cognitive remediation    PT Goals (Current goals can be found in the Care Plan section)  Acute Rehab PT Goals Patient Stated Goal: to transition to STR at discharge PT Goal Formulation: With patient/family Time For Goal Achievement: 12/13/18 Potential to Achieve Goals: Good    Frequency 7X/week   Barriers to discharge Decreased caregiver support      Co-evaluation               AM-PAC PT "6 Clicks" Mobility  Outcome Measure Help needed turning from your back to your side while in a flat bed without using bedrails?: A Little Help needed moving from lying on your back to sitting on the side of a flat bed without using bedrails?: A Little Help needed moving to and from a bed to a chair (including a wheelchair)?: A Lot Help needed standing up from a chair using your arms (e.g., wheelchair or bedside chair)?: A Lot Help needed to walk in hospital room?: A Lot Help needed climbing 3-5 steps with a railing? : A Lot 6 Click Score: 14    End of Session Equipment Utilized During Treatment: Gait belt Activity Tolerance: Patient tolerated treatment well Patient left: in chair;with call bell/phone within reach;with chair alarm set;with family/visitor present Nurse Communication: Mobility status PT Visit Diagnosis: Unsteadiness on feet (R26.81);Muscle weakness (generalized)  (M62.81);Difficulty in walking, not elsewhere classified (R26.2);Other symptoms and signs involving the nervous system (R29.898) Hemiplegia - caused by: Cerebral infarction    Time: 1594-5859 PT Time Calculation (min) (ACUTE ONLY): 23 min   Charges:   PT Evaluation $PT Eval Moderate Complexity: 1 Mod PT Treatments $Neuromuscular Re-education: 8-22 mins       Jeremih Dearmas H. Owens Shark, PT, DPT, NCS 11/29/18, 2:55 PM [phone]

## 2018-11-29 NOTE — Progress Notes (Signed)
[place] [facility] [place] NAME: Shelly Padilla    MR#:  [id number]  DATE OF BIRTH:  [birth year]  SUBJECTIVE:  CHIEF COMPLAINT: Patient is pleasantly confused today could not recognize her son but was able to tell her name, birthday and age but could not give you more history MRI of the brain has revealed subacute stroke probably embolic  REVIEW OF SYSTEMS:   Review system unobtainable DRUG ALLERGIES:   Allergies  Allergen Reactions  . Amoxil [Amoxicillin] Anxiety  . Codeine   . Pacerone  [Amiodarone Hcl] Nausea And Vomiting  . Sulfa Drugs Cross Reactors   . Amlodipine Other (See Comments)    Mild edema    VITALS:  Blood pressure (!) 141/100, pulse 80, temperature 97.7 F (36.5 C), temperature source Oral, resp. rate 20, height 5\' 8"  (1.727 m), weight 62.4 kg, SpO2 97 %.  PHYSICAL EXAMINATION:  GENERAL:  84 y.o.-year-old patient lying in the bed with no acute distress.  EYES: Pupils equal, round, reactive to light and accommodation. No scleral icterus. Extraocular muscles intact.  HEENT: Head atraumatic, normocephalic. Oropharynx and nasopharynx clear.  NECK:  Supple, no jugular venous distention. No thyroid enlargement, no tenderness.  LUNGS: Normal breath sounds bilaterally, no wheezing, rales,rhonchi or crepitation. No use of accessory muscles of respiration.  CARDIOVASCULAR: S1, S2 normal. No murmurs, rubs, or gallops.  ABDOMEN: Soft, nontender, nondistended. Bowel sounds present. EXTREMITIES: No pedal edema, cyanosis, or clubbing.  NEUROLOGIC: Awake and alert, oriented x1-. Sensation intact. Gait not checked.  PSYCHIATRIC: The patient is alert and oriented x 1.  SKIN: No obvious rash, lesion, or ulcer.    LABORATORY PANEL:   CBC Recent Labs  Lab 11/29/18 0548  WBC 11.7*  HGB 15.0  HCT 45.5  PLT 714*    ------------------------------------------------------------------------------------------------------------------  Chemistries  Recent Labs  Lab 11/27/18 1122 11/28/18 0421 11/29/18 0548  NA 135 136 135  K 3.5 3.4* 4.1  CL 100 102 103  CO2 28 26 24   GLUCOSE 131* 101* 101*  BUN 21 15 16   CREATININE 0.57 0.34* 0.44  CALCIUM 8.6* 8.3* 8.5*  MG  --  1.9  --   AST 28  --   --   ALT 15  --   --   ALKPHOS 52  --   --   BILITOT 0.7  --   --    ------------------------------------------------------------------------------------------------------------------  Cardiac Enzymes Recent Labs  Lab 11/26/18 1011  TROPONINI <0.03   ------------------------------------------------------------------------------------------------------------------  RADIOLOGY:  Ct Head Wo Contrast  Result Date: 11/27/2018 CLINICAL DATA:  Confusion, hallucinations. EXAM: CT HEAD WITHOUT CONTRAST TECHNIQUE: Contiguous axial images were obtained from the base of the skull through the vertex without intravenous contrast. COMPARISON:  11/26/2018 FINDINGS: Brain: There is a slightly hyperdense rounded structure/mass in the right temporal occipital region measuring up to 3.1 cm compatible with meningioma, stable since prior study. There is atrophy and chronic small vessel disease changes. No hemorrhage, hydrocephalus or acute infarction. Vascular: No hyperdense vessel or unexpected calcification. Skull: No acute calvarial abnormality. Sinuses/Orbits: Visualized paranasal sinuses and mastoids clear. Orbital soft tissues unremarkable. Other: None IMPRESSION: 3 cm right temporal occipital meningioma, stable. Atrophy, chronic small vessel disease. No acute intracranial abnormality. Electronically Signed   By: Rolm Baptise M.D.   On: 11/27/2018 16:49   Mr Brain Wo Contrast  Result Date: 11/29/2018 CLINICAL DATA:  Worsening confusion. Frequent falls. History of meningioma. EXAM: MRI HEAD WITHOUT CONTRAST TECHNIQUE:  Multiplanar, multiecho pulse sequences  of the brain and surrounding structures were obtained without intravenous contrast. COMPARISON:  CT HEAD November 27, 2018 and MRI of the head November 26, 2017 FINDINGS: Multiple sequences are moderately motion degraded. INTRACRANIAL CONTENTS: Patchy reduced diffusion LEFT greater than RIGHT parietal lobes, RIGHT frontal lobe with normalized ADC values. Reduced diffusion and low ADC values associated with RIGHT temporal occipital lobe meningioma better characterized on prior contrast enhanced examination. Similar regional mass effect without midline shift. Patchy supratentorial and pontine white matter T2 hyperintensities. Old small LEFT cerebellar infarct. No parenchymal brain volume loss for age. No abnormal extra-axial fluid collections. Stable asymmetrically prominent LEFT posterior fossa extra-axial space. VASCULAR: Normal major intracranial vascular flow voids present at skull base. SKULL AND UPPER CERVICAL SPINE: No abnormal sellar expansion. No suspicious calvarial bone marrow signal. Craniocervical junction maintained. SINUSES/ORBITS: Small LEFT mastoid effusion. Included ocular globes and orbital contents are non-suspicious. Status post bilateral ocular lens implants. Tortuous optic nerve she seen with intracranial hypertension. OTHER: None. IMPRESSION: 1. Motion degraded examination. 2. Subacute small biparietal and RIGHT frontal lobe infarcts, suggesting embolic phenomena. 3. Stable noncontrast appearance of RIGHT temporal occipital lobe meningioma. 4. Mild-to-moderate chronic small vessel ischemic changes. Old small LEFT cerebellar infarct. Electronically Signed   By: Elon Alas M.D.   On: 11/29/2018 05:54   Ct Abdomen Pelvis W Contrast  Result Date: 11/27/2018 CLINICAL DATA:  Abdominal pain. Possible UTI. EXAM: CT ABDOMEN AND PELVIS WITH CONTRAST TECHNIQUE: Multidetector CT imaging of the abdomen and pelvis was performed using the standard protocol  following bolus administration of intravenous contrast. CONTRAST:  140mL ISOVUE-300 IOPAMIDOL (ISOVUE-300) INJECTION 61% COMPARISON:  CT abdomen pelvis 11/26/2018 FINDINGS: LOWER CHEST: There is no basilar pleural or apical pericardial effusion. HEPATOBILIARY: The hepatic contours and density are normal. There is no intra- or extrahepatic biliary dilatation. The gallbladder is normal. PANCREAS: The pancreatic parenchymal contours are normal and there is no ductal dilatation. There is no peripancreatic fluid collection. SPLEEN: Normal. ADRENALS/URINARY TRACT: --Adrenal glands: Normal. --Right kidney/ureter: No hydronephrosis, nephroureterolithiasis, perinephric stranding or solid renal mass. --Left kidney/ureter: There is an interpolar cyst measuring 3.7 cm. No hydronephrosis, nephroureterolithiasis, perinephric stranding or solid renal mass. --Urinary bladder: Normal for degree of distention STOMACH/BOWEL: --Stomach/Duodenum: There is no hiatal hernia or other gastric abnormality. The duodenal course and caliber are normal. --Small bowel: No dilatation or inflammation. --Colon: No focal abnormality. --Appendix: Normal. VASCULAR/LYMPHATIC: Atherosclerotic calcification is present within the non-aneurysmal abdominal aorta, without hemodynamically significant stenosis. No abdominal or pelvic lymphadenopathy. REPRODUCTIVE: Normal uterus and ovaries. MUSCULOSKELETAL. Multilevel degenerative disc disease and facet arthrosis. No bony spinal canal stenosis. OTHER: None. IMPRESSION: No acute abdominal or pelvic abnormality. Aortic atherosclerosis (ICD10-I70.0). Electronically Signed   By: Ulyses Jarred M.D.   On: 11/27/2018 17:03    EKG:   Orders placed or performed during the hospital encounter of 11/26/18  . ED EKG  . ED EKG  . EKG 12-Lead  . EKG 12-Lead  . EKG    ASSESSMENT AND PLAN:    #Altered mental status-probably from dehydration/subacute stroke probably embolic  Gentle hydration with IV fluids  provided Neurochecks Neurology consult placed not recommending any anticoagulation though it seems to be embolic subacute stroke given elderly age and multiple falls high risk of bleeding Baby aspirin enteric-coated once daily Follow-up with neurology Fasting lipid panel in a.m.  #Possible sepsis Blood cultures are negative and urine cultures pending Empiric IV levofloxacin-changed to p.o. Lactic acid 1.3 Flu test is negative TSH is normal  #History of dementia  Continue close monitoring  #Essential hypertension Blood pressure is better Continue home medication metoprolol, ramipril titrate as needed   Disposition [facility] assisted [facility] facility when clinically stable  All the records are reviewed and case discussed with Care Management/Social Workerr. Management plans discussed with the patient, son and they are in agreement.  CODE STATUS: DNR, son Shelly Padilla is the healthcare power of attorney  TOTAL TIME TAKING CARE OF THIS PATIENT: 107minutes.   POSSIBLE D/C IN 1-2 DAYS, DEPENDING ON CLINICAL CONDITION.  Note: This dictation was prepared with Dragon dictation along with smaller phrase technology. Any transcriptional errors that result from this process are unintentional.   Nicholes Mango M.D on 11/29/2018 at 2:29 PM  Between 7am to 6pm - Pager - [phone] After 6pm go to [email or web address] - password [email or web address] [facility]  Office  [phone]  CC: Primary care physician; Venia Carbon, MD

## 2018-11-29 NOTE — Evaluation (Signed)
Clinical/Bedside Swallow Evaluation Shelly Padilla Details  Name: Shelly Padilla MRN: [id number] Date of Birth: 04-17-1920  Today's Date: 11/29/2018 Time: SLP Start Time (ACUTE ONLY): 1145 SLP Stop Time (ACUTE ONLY): 1245 SLP Time Calculation (min) (ACUTE ONLY): 60 min  Past Medical History:  Past Medical History:  Diagnosis Date  . Atrial fibrillation ([place])   . Brain tumor ([place])    meningioma  . Glaucoma   . H/O paroxysmal supraventricular tachycardia    documented by Holter Monitor  . Hemorrhoids   . Hiatal hernia   . History of ovarian cyst   . HTN (hypertension)   . Insomnia   . Palpitations   . Thrombocytosis ([place])    Past Surgical History:  Past Surgical History:  Procedure Laterality Date  . CATARACT EXTRACTION     left eye  . Leg vein stripping  1970  . TONSILLECTOMY  1929   HPI:  Shelly Padilla is a 84 y.o. female w/ h/o Dementia, Hiatal Hernia, Major Depressive Dis. w/ trouble focusing, sadness, and seeing "people walking by me, that aren't there", HTN, TIA w/ stenosis and meningioma, ongoing Balance issues noted in 02/2018, depression, and other medical issues who presents to the ER 24 hours after evaluation here yesterday for fall presents with worsening confusion and hallucinations.  Shelly Padilla pleasant appearing but is quite confused hallucinating and reaching for things. New medications include zoloft. Falling started after the zoloft which has been decreased by her PCP. Per MD note and Son's report today, Shelly Padilla is more awake and alert, verbally conversing w/ others but has underlying dementia and is HOH; he feels she is improving overall. He stated she ate a "good breakfast this morning" enjoying the [place].    Assessment / Plan / Recommendation Clinical Impression  Shelly Padilla appears to present w/ adequate oropharyngeal phase swallowing function w/ no overt s/s of aspiration noted during po trials. Shelly Padilla consumed po trials needing setup and positioning upright in chair first. She consumed trials  feeding self w/ No overt s/s of aspiration noted; clear vocal quality and no decline noted in respiratory effort noted during/post trials. Oral phase appeared grossly [facility] for bolus management, mastication, and oral clearing. OM exam revealed no unilateral weakness. Recommend current diet as ordered by MD w/ thin liquids. Recommend general aspiration precautions; Pills in Puree for easier swallowing if needed. Discussion w/ Son and Shelly Padilla on the need to reduce Distractions in room/at meals secondary to the baseline Dementia in order to allow Shelly Padilla to better focus on the task of the meal, feeding self. Also recommended easier to eat foods for efficient oral intake, hydration. No further skilled ST services indicated as Shelly Padilla appears at her baseline, and Son denied any deficits. NSG updated. Son/Shelly Padilla agreed. SLP Visit Diagnosis: Dysphagia, unspecified (R13.10)(suspect impact on focus from Cognitive decline)    Aspiration Risk  (reduced following precautions)    Diet Recommendation  Age appropriate Regular diet w/ moistened foods, cut meats; Thin liquids. General aspiration precautions.   Medication Administration: Whole meds with puree(if needed for easier swallowing)    Other  Recommendations Recommended Consults: (Dietician f/u if indicated) Oral Care Recommendations: Oral care BID;Staff/trained caregiver to provide oral care(assist) Other Recommendations: (n/a)   Follow up Recommendations None      Frequency and Duration (n/a)  (n/a)       Prognosis Prognosis for Safe Diet Advancement: Good Barriers to Reach Goals: Cognitive deficits;Severity of deficits(baseline)      Swallow Study   General Date of Onset: 11/27/18 HPI: Shelly Padilla is  a 84 y.o. female w/ h/o Dementia, Hiatal Hernia, Major Depressive Dis. w/ trouble focusing, sadness, and seeing "people walking by me, that aren't there", HTN, TIA w/ stenosis and meningioma, ongoing Balance issues noted in 02/2018, depression, and other medical issues who  presents to the ER 24 hours after evaluation here yesterday for fall presents with worsening confusion and hallucinations.  Shelly Padilla pleasant appearing but is quite confused hallucinating and reaching for things. New medications include zoloft. Falling started after the zoloft which has been decreased by her PCP. Per MD note and Son's report today, Shelly Padilla is more awake and alert, verbally conversing w/ others but has underlying dementia and is HOH; he feels she is improving overall. He stated she ate a "good breakfast this morning" enjoying the [place].  Type of Study: Bedside Swallow Evaluation Previous Swallow Assessment: none reported Diet Prior to this Study: Regular;Thin liquids Temperature Spikes Noted: No(wbc 11.7) Respiratory Status: Room air History of Recent Intubation: No Behavior/Cognition: Alert;Cooperative;Pleasant mood;Confused;Distractible;Requires cueing(HOH; baseline Dementia) Oral Cavity Assessment: Within Functional Limits Oral Care Completed by SLP: Recent completion by staff Oral Cavity - Dentition: Adequate natural dentition Vision: Functional for self-feeding Self-Feeding Abilities: Able to feed self;Needs assist;Needs set up Shelly Padilla Positioning: Upright in chair(needed positioning by staff) Baseline Vocal Quality: Normal Volitional Cough: Strong Volitional Swallow: Able to elicit    Oral/Motor/Sensory Function Overall Oral Motor/Sensory Function: Within functional limits(no unilateral weakness)   Ice Chips Ice chips: Not tested   Thin Liquid Thin Liquid: Within functional limits Presentation: Self Fed;Straw(~2-3 ozs) Other Comments: "this is good"    Nectar Thick Nectar Thick Liquid: Not tested   Honey Thick Honey Thick Liquid: Not tested   Puree Puree: Within functional limits Presentation: Self Fed;Spoon(4 ozs)   Solid     Solid: Within functional limits Presentation: Self Fed;Spoon(5-6 trials) Other Comments: also ate "Berniece Salines" at breakfast this morning per Son and  "couldn't get enough of it"       Orinda Kenner, MS, CCC-SLP , 11/29/2018,3:14 PM

## 2018-11-29 NOTE — Consult Note (Signed)
Referring Physician: Nicholes Mango    Chief Complaint: Altered mental status  HPI: Shelly Padilla is an 84 y.o. female with past medical history of atrial fibrillation, meningioma, paroxysmal supraventricular tachycardia, hypertension, insomnia, recurrent falls and thrombocytosis sent him to the ED with worsening confusion and hallucinations.  Patient was recently evaluated in the ED on 11/26/2018 for falls family she has been more confused since then.  She currently lives at a facility and per family facility staff told them to bring patient back due to concern for possible urinary tract infection.  Initial labs revealed elevated white count of 12.0 however this is improved from 2 days ago which was 14.2, chronic thrombocytosis, serum 3.4, calcium 8.3, urine culture pending.  MRI of the brain performed and showed subacute small biparietal and right frontal lobe infarcts.  She was therefore admitted for further work-up and management.  Past Medical History:  Diagnosis Date  . Atrial fibrillation ([place])   . Brain tumor ([place])    meningioma  . Glaucoma   . H/O paroxysmal supraventricular tachycardia    documented by Holter Monitor  . Hemorrhoids   . Hiatal hernia   . History of ovarian cyst   . HTN (hypertension)   . Insomnia   . Palpitations   . Thrombocytosis ([place])     Past Surgical History:  Procedure Laterality Date  . CATARACT EXTRACTION     left eye  . Leg vein stripping  1970  . TONSILLECTOMY  1929    Family History  Problem Relation Age of Onset  . Heart attack Father   . Heart failure Brother    Social History:  reports that she has never smoked. She has never used smokeless tobacco. She reports current alcohol use. She reports that she does not use drugs.  Allergies:  Allergies  Allergen Reactions  . Amoxil [Amoxicillin] Anxiety  . Codeine   . Pacerone  [Amiodarone Hcl] Nausea And Vomiting  . Sulfa Drugs Cross Reactors   . Amlodipine Other (See Comments)    Mild  edema    Medications:  I have reviewed the patient's current medications. Prior to Admission:  Medications Prior to Admission  Medication Sig Dispense Refill Last Dose  . acetaminophen (TYLENOL) 325 MG tablet Take 325-650 mg by mouth every 4 (four) hours as needed for mild pain or fever.    prn at prn  . alum & mag hydroxide-simeth (MAALOX/MYLANTA) 200-200-20 MG/5ML suspension Take 30 mLs by mouth every 4 (four) hours as needed for indigestion or heartburn.   prn at prn  . bismuth subsalicylate (KAOPECTATE) 262 MG/15ML suspension Take 10 mLs by mouth every 6 (six) hours as needed.   prn at prn  . carbamide peroxide (DEBROX) 6.5 % OTIC solution Place 5 drops into both ears daily.   Unknown at Unknown  . dextromethorphan-guaiFENesin (TUSSIN DM) 10-100 MG/5ML liquid Take 10 mLs by mouth every 4 (four) hours as needed for cough.   prn at prn  . diphenhydrAMINE (BENADRYL) 25 mg capsule Take 25 mg by mouth every 4 (four) hours as needed for itching or allergies.   prn at prn  . famotidine (PEPCID) 20 MG tablet Take 1 tablet (20 mg total) by mouth 2 (two) times daily. (Patient taking differently: Take 20 mg by mouth 2 (two) times daily. (0700 & 1800)) 60 tablet 0 Unknown at Unknown  . hydrochlorothiazide (HYDRODIURIL) 25 MG tablet Take 25 mg by mouth daily. Take 25 mg by mouth in the morning if pedal edema is  noted   Unknown at Unknown  . magnesium hydroxide (MILK OF MAGNESIA) 400 MG/5ML suspension Take 30 mLs by mouth daily as needed for mild constipation.   prn at prn  . Melatonin 5 MG CAPS Take 5 mg by mouth at bedtime as needed. (2100)   prn at prn  . metoprolol succinate (TOPROL-XL) 50 MG 24 hr tablet Take 1 tablet (50 mg total) by mouth daily with supper. Take with or immediately following a meal. (Patient taking differently: Take 50 mg by mouth daily with supper. Take with or immediately following a meal. (1800)) 30 tablet 6 Unknown at Unknown  . Multiple Vitamins-Minerals (CENTRUM SILVER PO) Take  1 tablet by mouth daily. (1800)   Unknown at Unknown  . nystatin (MYCOSTATIN/NYSTOP) powder Apply 1 Bottle topically 2 (two) times daily. Under breast and to groin/abdominal fold areas as needed for rash   Unknown at Unknown  . ondansetron (ZOFRAN) 4 MG tablet Take 4 mg by mouth 3 (three) times daily as needed for nausea.   prn at prn  . polyethylene glycol (MIRALAX / GLYCOLAX) packet Take 17 g by mouth daily. (0700)   Unknown at Unknown  . ramipril (ALTACE) 10 MG capsule TAKE ONE CAPSULE BY MOUTH ONCE DAILY (Patient taking differently: Take 10 mg by mouth daily. (0700)) 90 capsule 3 Unknown at Unknown  . sennosides-docusate sodium (SENOKOT-S) 8.6-50 MG tablet Take 2 tablets by mouth daily.   Unknown at Unknown  . sertraline (ZOLOFT) 25 MG tablet Take 25 mg by mouth daily. Take one tablet by mouth every day for 1 week and then every other day for 1 week and then stop. (0700)   Unknown at Unknown  . timolol (TIMOPTIC) 0.5 % ophthalmic solution Place 1 drop into both eyes daily. (1630)   Unknown at Unknown   Scheduled: . bismuth subsalicylate  10 mL Oral TID AC & HS  . famotidine  20 mg Oral BID  . heparin  5,000 Units Subcutaneous Q8H  . mouth rinse  15 mL Mouth Rinse BID  . metoprolol succinate  50 mg Oral Q supper  . nitroGLYCERIN  0.5 inch Topical Q6H  . ramipril  10 mg Oral Daily  . timolol  1 drop Both Eyes Daily    ROS: Unable to obtain from patient due to altered mental status. Physical Examination: Blood pressure (!) 179/75, pulse 88, temperature 97.8 F (36.6 C), temperature source Oral, resp. rate 16, height 5\' 8"  (1.727 m), weight 62.4 kg, SpO2 95 %.   HEENT-  Normocephalic, no lesions, without obvious abnormality.  Normal external eye and conjunctiva.  Normal TM's bilaterally.  Normal auditory canals and external ears. Normal external nose, mucus membranes and septum.  Normal pharynx. Cardiovascular- S1, S2 normal, pulses palpable throughout   Lungs- chest clear, no wheezing,  rales, normal symmetric air entry Abdomen- soft, non-tender; bowel sounds normal; no masses,  no organomegaly Extremities- no edema Lymph-no adenopathy palpable Musculoskeletal-no joint tenderness, deformity or swelling Skin-warm and dry, no hyperpigmentation, vitiligo, or suspicious lesions  Neurological Exam   Mental Status: Pleasantly confused.  Speech fluent without evidence of aphasia.  Able to follow 3 step commands without difficulty. Attention span and concentration seemed appropriate  Cranial Nerves: II: Discs flat bilaterally; Visual fields grossly normal, pupils equal, round, reactive to light and accommodation III,IV, VI: ptosis not present, extra-ocular motions intact bilaterally V,VII: smile symmetric, facial light touch sensation intact VIII: hearing normal bilaterally IX,X: gag reflex present XI: bilateral shoulder shrug XII: midline tongue  extension Motor: Right :  Upper extremity   5/5 Without pronator drift      Left: Upper extremity   5/5 without pronator drift Right:   Lower extremity   5/5                                          Left: Lower extremity   5/5 Tone and bulk:normal tone throughout; no atrophy noted Sensory: Pinprick and light touch intact bilaterally Deep Tendon Reflexes: 2+ and symmetric throughout Plantars: Right: mute                              Left: mute Cerebellar: Finger-to-nose testing intact bilaterally. Heel to shin testing normal bilaterally Gait: not tested due to safety concerns  Data Reviewed  Laboratory Studies:  Basic Metabolic Panel: Recent Labs  Lab 11/26/18 1011 11/27/18 1122 11/28/18 0421 11/29/18 0548  NA 134* 135 136 135  K 4.0 3.5 3.4* 4.1  CL 100 100 102 103  CO2 27 28 26 24   GLUCOSE 148* 131* 101* 101*  BUN 19 21 15 16   CREATININE 0.50 0.57 0.34* 0.44  CALCIUM 8.8* 8.6* 8.3* 8.5*  MG  --   --  1.9  --     Liver Function Tests: Recent Labs  Lab 11/26/18 1011 11/27/18 1122  AST 23 28  ALT 16 15   ALKPHOS 54 52  BILITOT 1.0 0.7  PROT 6.8 6.6  ALBUMIN 4.0 4.1   No results for input(s): LIPASE, AMYLASE in the last 168 hours. No results for input(s): AMMONIA in the last 168 hours.  CBC: Recent Labs  Lab 11/26/18 1011 11/27/18 1122 11/28/18 0421 11/29/18 0548  WBC 11.6* 14.2* 12.0* 11.7*  NEUTROABS 9.6* 11.5*  --   --   HGB 14.6 14.0 14.1 15.0  HCT 44.8 43.3 42.5 45.5  MCV 91.1 89.6 88.9 88.5  PLT 637* 753* 618* 714*    Cardiac Enzymes: Recent Labs  Lab 11/26/18 1011  CKTOTAL 20*  TROPONINI <0.03    BNP: Invalid input(s): POCBNP  CBG: No results for input(s): GLUCAP in the last 168 hours.  Microbiology: Results for orders placed or performed during the hospital encounter of 11/27/18  Culture, blood (Routine X 2) w Reflex to ID Panel     Status: None (Preliminary result)   Collection Time: 11/27/18  5:03 PM  Result Value Ref Range Status   Specimen Description BLOOD LT [facility]  Final   Special Requests   Final    BOTTLES DRAWN AEROBIC AND ANAEROBIC Blood Culture adequate volume   Culture   Final    NO GROWTH 2 DAYS Performed at [facility], [address] [place], [place] [address]    Report Status PENDING  Incomplete  Culture, blood (Routine X 2) w Reflex to ID Panel     Status: None (Preliminary result)   Collection Time: 11/27/18  5:11 PM  Result Value Ref Range Status   Specimen Description BLOOD RT HAND  Final   Special Requests   Final    BOTTLES DRAWN AEROBIC AND ANAEROBIC Blood Culture adequate volume   Culture   Final    NO GROWTH 2 DAYS Performed at [facility], [address], [place],  [address]    Report Status PENDING  Incomplete    Coagulation Studies: No results for  input(s): LABPROT, INR in the last 72 hours.  Urinalysis:  Recent Labs  Lab 11/26/18 1043 11/27/18 1122  COLORURINE YELLOW* AMBER*  LABSPEC 1.016 1.020  PHURINE 6.0 5.0  GLUCOSEU NEGATIVE NEGATIVE  HGBUR NEGATIVE NEGATIVE  BILIRUBINUR  NEGATIVE NEGATIVE  KETONESUR 5* NEGATIVE  PROTEINUR NEGATIVE 30*  NITRITE NEGATIVE NEGATIVE  LEUKOCYTESUR NEGATIVE NEGATIVE    Lipid Panel:    Component Value Date/Time   CHOL 138 11/27/2017 0607   CHOL 120 08/03/2014 0455   TRIG 67 11/27/2017 0607   TRIG 64 08/03/2014 0455   HDL 48 11/27/2017 0607   HDL 48 08/03/2014 0455   CHOLHDL 2.9 11/27/2017 0607   VLDL 13 11/27/2017 0607   VLDL 13 08/03/2014 0455   LDLCALC 77 11/27/2017 0607   LDLCALC 59 08/03/2014 0455    HgbA1C:  Lab Results  Component Value Date   HGBA1C 5.5 11/27/2017    Urine Drug Screen:  No results found for: LABOPIA, COCAINSCRNUR, LABBENZ, AMPHETMU, THCU, LABBARB  Alcohol Level: No results for input(s): ETH in the last 168 hours.  Other results: A-fib Imaging: Dg Chest 1 View  Result Date: 11/27/2018 CLINICAL DATA:  Altered mental status. EXAM: CHEST  1 VIEW COMPARISON:  July 22, 2016 and November 26, 2017 FINDINGS: Minimal atelectasis in the left base. The heart, hila, mediastinum, lungs, and pleura are otherwise unremarkable and unchanged. IMPRESSION: Minimal atelectasis in the left base.  No other acute abnormalities. Electronically Signed   By: Dorise Bullion III M.D   On: 11/27/2018 14:24   Ct Head Wo Contrast  Result Date: 11/27/2018 CLINICAL DATA:  Confusion, hallucinations. EXAM: CT HEAD WITHOUT CONTRAST TECHNIQUE: Contiguous axial images were obtained from the base of the skull through the vertex without intravenous contrast. COMPARISON:  11/26/2018 FINDINGS: Brain: There is a slightly hyperdense rounded structure/mass in the right temporal occipital region measuring up to 3.1 cm compatible with meningioma, stable since prior study. There is atrophy and chronic small vessel disease changes. No hemorrhage, hydrocephalus or acute infarction. Vascular: No hyperdense vessel or unexpected calcification. Skull: No acute calvarial abnormality. Sinuses/Orbits: Visualized paranasal sinuses and mastoids  clear. Orbital soft tissues unremarkable. Other: None IMPRESSION: 3 cm right temporal occipital meningioma, stable. Atrophy, chronic small vessel disease. No acute intracranial abnormality. Electronically Signed   By: Rolm Baptise M.D.   On: 11/27/2018 16:49   Mr Brain Wo Contrast  Result Date: 11/29/2018 CLINICAL DATA:  Worsening confusion. Frequent falls. History of meningioma. EXAM: MRI HEAD WITHOUT CONTRAST TECHNIQUE: Multiplanar, multiecho pulse sequences of the brain and surrounding structures were obtained without intravenous contrast. COMPARISON:  CT HEAD November 27, 2018 and MRI of the head November 26, 2017 FINDINGS: Multiple sequences are moderately motion degraded. INTRACRANIAL CONTENTS: Patchy reduced diffusion LEFT greater than RIGHT parietal lobes, RIGHT frontal lobe with normalized ADC values. Reduced diffusion and low ADC values associated with RIGHT temporal occipital lobe meningioma better characterized on prior contrast enhanced examination. Similar regional mass effect without midline shift. Patchy supratentorial and pontine white matter T2 hyperintensities. Old small LEFT cerebellar infarct. No parenchymal brain volume loss for age. No abnormal extra-axial fluid collections. Stable asymmetrically prominent LEFT posterior fossa extra-axial space. VASCULAR: Normal major intracranial vascular flow voids present at skull base. SKULL AND UPPER CERVICAL SPINE: No abnormal sellar expansion. No suspicious calvarial bone marrow signal. Craniocervical junction maintained. SINUSES/ORBITS: Small LEFT mastoid effusion. Included ocular globes and orbital contents are non-suspicious. Status post bilateral ocular lens implants. Tortuous optic nerve she seen with intracranial hypertension. OTHER:  None. IMPRESSION: 1. Motion degraded examination. 2. Subacute small biparietal and RIGHT frontal lobe infarcts, suggesting embolic phenomena. 3. Stable noncontrast appearance of RIGHT temporal occipital lobe  meningioma. 4. Mild-to-moderate chronic small vessel ischemic changes. Old small LEFT cerebellar infarct. Electronically Signed   By: Elon Alas M.D.   On: 11/29/2018 05:54   Ct Abdomen Pelvis W Contrast  Result Date: 11/27/2018 CLINICAL DATA:  Abdominal pain. Possible UTI. EXAM: CT ABDOMEN AND PELVIS WITH CONTRAST TECHNIQUE: Multidetector CT imaging of the abdomen and pelvis was performed using the standard protocol following bolus administration of intravenous contrast. CONTRAST:  149mL ISOVUE-300 IOPAMIDOL (ISOVUE-300) INJECTION 61% COMPARISON:  CT abdomen pelvis 11/26/2018 FINDINGS: LOWER CHEST: There is no basilar pleural or apical pericardial effusion. HEPATOBILIARY: The hepatic contours and density are normal. There is no intra- or extrahepatic biliary dilatation. The gallbladder is normal. PANCREAS: The pancreatic parenchymal contours are normal and there is no ductal dilatation. There is no peripancreatic fluid collection. SPLEEN: Normal. ADRENALS/URINARY TRACT: --Adrenal glands: Normal. --Right kidney/ureter: No hydronephrosis, nephroureterolithiasis, perinephric stranding or solid renal mass. --Left kidney/ureter: There is an interpolar cyst measuring 3.7 cm. No hydronephrosis, nephroureterolithiasis, perinephric stranding or solid renal mass. --Urinary bladder: Normal for degree of distention STOMACH/BOWEL: --Stomach/Duodenum: There is no hiatal hernia or other gastric abnormality. The duodenal course and caliber are normal. --Small bowel: No dilatation or inflammation. --Colon: No focal abnormality. --Appendix: Normal. VASCULAR/LYMPHATIC: Atherosclerotic calcification is present within the non-aneurysmal abdominal aorta, without hemodynamically significant stenosis. No abdominal or pelvic lymphadenopathy. REPRODUCTIVE: Normal uterus and ovaries. MUSCULOSKELETAL. Multilevel degenerative disc disease and facet arthrosis. No bony spinal canal stenosis. OTHER: None. IMPRESSION: No acute  abdominal or pelvic abnormality. Aortic atherosclerosis (ICD10-I70.0). Electronically Signed   By: Ulyses Jarred M.D.   On: 11/27/2018 17:03   Assessment: 84 y.o. female with past medical history of atrial fibrillation, meningioma, paroxysmal supraventricular tachycardia, hypertension, insomnia, recurrent falls and thrombocytosis sent him to the ED with altered mental status. Recently evaluated in the ED on 11/26/2018 for multiple falls with negative work up. MRI of the brain  showed subacute small biparietal and right frontal lobe infarcts. Etiology likely embolic.  Patient was not on any anticoagulation or antiplatelet prior to this event.  Discussed risk and benefit of anticoagulation with the patient's son, however due to her high risk for fall and age Vernard Gambles deemed not a candidate for anticoagulation.  Stroke Risk Factors - atrial fibrillation, hyperlipidemia and hypertension  Plan: 1. Prophylactic therapy-Antiplatelet med: Aspirin - dose 81 mg/day 2. PT consult, OT consult, Speech consult 3. Avoid memory impairing medication such as Benadryl 4. NPO until RN stroke swallow screen 5.Telemetry monitoring 6. Frequent neuro checks  Bilateral ischemic stroke in setting of A-fib. Pt has hx of falls and lives in facility. No anticoagulation due to falls.  Vascular dementia

## 2018-11-30 DIAGNOSIS — D473 Essential (hemorrhagic) thrombocythemia: Secondary | ICD-10-CM | POA: Diagnosis not present

## 2018-11-30 DIAGNOSIS — D329 Benign neoplasm of meninges, unspecified: Secondary | ICD-10-CM | POA: Diagnosis not present

## 2018-11-30 DIAGNOSIS — F39 Unspecified mood [affective] disorder: Secondary | ICD-10-CM | POA: Diagnosis not present

## 2018-11-30 DIAGNOSIS — H409 Unspecified glaucoma: Secondary | ICD-10-CM | POA: Diagnosis not present

## 2018-11-30 DIAGNOSIS — G4700 Insomnia, unspecified: Secondary | ICD-10-CM | POA: Diagnosis not present

## 2018-11-30 DIAGNOSIS — F0150 Vascular dementia without behavioral disturbance: Secondary | ICD-10-CM | POA: Diagnosis not present

## 2018-11-30 DIAGNOSIS — R2681 Unsteadiness on feet: Secondary | ICD-10-CM | POA: Diagnosis not present

## 2018-11-30 DIAGNOSIS — R262 Difficulty in walking, not elsewhere classified: Secondary | ICD-10-CM | POA: Diagnosis not present

## 2018-11-30 DIAGNOSIS — I639 Cerebral infarction, unspecified: Secondary | ICD-10-CM | POA: Diagnosis not present

## 2018-11-30 DIAGNOSIS — R0902 Hypoxemia: Secondary | ICD-10-CM | POA: Diagnosis not present

## 2018-11-30 DIAGNOSIS — Z9181 History of falling: Secondary | ICD-10-CM | POA: Diagnosis not present

## 2018-11-30 DIAGNOSIS — Z741 Need for assistance with personal care: Secondary | ICD-10-CM | POA: Diagnosis not present

## 2018-11-30 DIAGNOSIS — E7800 Pure hypercholesterolemia, unspecified: Secondary | ICD-10-CM | POA: Diagnosis not present

## 2018-11-30 DIAGNOSIS — I10 Essential (primary) hypertension: Secondary | ICD-10-CM | POA: Diagnosis not present

## 2018-11-30 DIAGNOSIS — M2550 Pain in unspecified joint: Secondary | ICD-10-CM | POA: Diagnosis not present

## 2018-11-30 DIAGNOSIS — I69398 Other sequelae of cerebral infarction: Secondary | ICD-10-CM | POA: Diagnosis not present

## 2018-11-30 DIAGNOSIS — R4189 Other symptoms and signs involving cognitive functions and awareness: Secondary | ICD-10-CM | POA: Diagnosis not present

## 2018-11-30 DIAGNOSIS — Z8673 Personal history of transient ischemic attack (TIA), and cerebral infarction without residual deficits: Secondary | ICD-10-CM | POA: Diagnosis not present

## 2018-11-30 DIAGNOSIS — M6281 Muscle weakness (generalized): Secondary | ICD-10-CM | POA: Diagnosis not present

## 2018-11-30 DIAGNOSIS — W19XXXA Unspecified fall, initial encounter: Secondary | ICD-10-CM | POA: Diagnosis not present

## 2018-11-30 DIAGNOSIS — R443 Hallucinations, unspecified: Secondary | ICD-10-CM | POA: Diagnosis not present

## 2018-11-30 DIAGNOSIS — R278 Other lack of coordination: Secondary | ICD-10-CM | POA: Diagnosis not present

## 2018-11-30 DIAGNOSIS — I4891 Unspecified atrial fibrillation: Secondary | ICD-10-CM | POA: Diagnosis not present

## 2018-11-30 DIAGNOSIS — F329 Major depressive disorder, single episode, unspecified: Secondary | ICD-10-CM | POA: Diagnosis not present

## 2018-11-30 DIAGNOSIS — K5900 Constipation, unspecified: Secondary | ICD-10-CM | POA: Diagnosis not present

## 2018-11-30 DIAGNOSIS — I480 Paroxysmal atrial fibrillation: Secondary | ICD-10-CM | POA: Diagnosis not present

## 2018-11-30 DIAGNOSIS — R4182 Altered mental status, unspecified: Secondary | ICD-10-CM | POA: Diagnosis not present

## 2018-11-30 DIAGNOSIS — I6340 Cerebral infarction due to embolism of unspecified cerebral artery: Secondary | ICD-10-CM | POA: Diagnosis not present

## 2018-11-30 DIAGNOSIS — R404 Transient alteration of awareness: Secondary | ICD-10-CM | POA: Diagnosis not present

## 2018-11-30 DIAGNOSIS — R1312 Dysphagia, oropharyngeal phase: Secondary | ICD-10-CM | POA: Diagnosis not present

## 2018-11-30 DIAGNOSIS — J181 Lobar pneumonia, unspecified organism: Secondary | ICD-10-CM | POA: Diagnosis not present

## 2018-11-30 DIAGNOSIS — Z7401 Bed confinement status: Secondary | ICD-10-CM | POA: Diagnosis not present

## 2018-11-30 LAB — URINE CULTURE
Culture: NO GROWTH
Special Requests: NORMAL

## 2018-11-30 LAB — LIPID PANEL
Cholesterol: 153 mg/dL (ref 0–200)
HDL: 44 mg/dL (ref >40)
LDL Cholesterol: 87 mg/dL (ref 0–99)
Total CHOL/HDL Ratio: 3.5 RATIO
Triglycerides: 109 mg/dL (ref <150)
VLDL: 22 mg/dL (ref 0–40)

## 2018-11-30 MED ORDER — ATORVASTATIN CALCIUM 10 MG PO TABS: 10.0000 mg | ORAL_TABLET | Freq: Every day | ORAL | 0 refills | Status: DC

## 2018-11-30 MED ORDER — FLEET ENEMA 7-19 GM/118ML RE ENEM: 1.0000 | ENEMA | RECTAL | Status: DC | PRN

## 2018-11-30 MED ORDER — BISACODYL 10 MG RE SUPP
10.0000 mg | Freq: Every day | RECTAL | Status: DC
  Administered 2018-11-30: 12:00:00 10 mg via RECTAL
  Filled 2018-11-30: qty 1

## 2018-11-30 MED ORDER — ASPIRIN 81 MG PO TBEC: 81.0000 mg | DELAYED_RELEASE_TABLET | Freq: Every day | ORAL | Status: DC

## 2018-11-30 NOTE — Plan of Care (Signed)
Pt being d/ced to [facility]. Previously from [facility] assisted living.  Pt was + for stroke.  Had been walking with walker prior to admission.  She can stand and pivot to chair and [facility] w/1 assist.  She is currently very lethargic and oriented only to self.  Called report to [facility].  She's going to [facility] and will transport via EMS.  Per family she hasn't had a BM in 5 days.  Per [facility] they want [place] to intervene.  Requested a dulcolax suppository from Dr. Margaretmary Eddy.  Nothing has been charted.  Will attempt to prompt BM and then will call EMS for transport.

## 2018-11-30 NOTE — Care Management Important Message (Signed)
Important Message  Patient Details  Name: Besse Miron MRN: [id number] Date of Birth: 1920-04-21   Medicare Important Message Given:  Yes    Juliann Pulse A Clydie Dillen 11/30/2018, 11:24 AM

## 2018-11-30 NOTE — Discharge Instructions (Signed)
Follow-up with primary care physician at the facility in 3 to 4 days Follow-up with outpatient neurology in 2 to 3 weeks

## 2018-11-30 NOTE — Clinical Social Work Note (Signed)
Patient is medically ready for discharge today. CSW notified patient and son at bedside. CSW also notified Seth Bake, admissions coordinator at [facility] of discharge today. Patient will be transported by EMS. RN to call report and call for transport.   [place], [place]

## 2018-11-30 NOTE — Discharge Summary (Signed)
[place] [facility] [place] NAME: Shelly Padilla    MR#:  [id number]  DATE OF BIRTH:  [birth year]  DATE OF ADMISSION:  11/27/2018 ADMITTING PHYSICIAN: Quintella Baton, MD  DATE OF DISCHARGE:  11/30/18  PRIMARY CARE PHYSICIAN: Venia Carbon, MD    ADMISSION DIAGNOSIS:  Delirium [R41.0] Consolidation lung ([place]) [J18.1]  DISCHARGE DIAGNOSIS:   Vascular dementia Subacute stroke probably embolic Frequent falls Chronic atrial fibrillation SECONDARY DIAGNOSIS:   Past Medical History:  Diagnosis Date  . Atrial fibrillation ([place])   . Brain tumor ([place])    meningioma  . Glaucoma   . H/O paroxysmal supraventricular tachycardia    documented by Holter Monitor  . Hemorrhoids   . Hiatal hernia   . History of ovarian cyst   . HTN (hypertension)   . Insomnia   . Palpitations   . Thrombocytosis [facility])     HOSPITAL COURSE:   HPI  HPI:  This is a 84 y/o female who resided at [facility]. She has been falling and was moved to the NH.  She is normally alert and oriented per records.  Yesterday she fell and was brought to the ER. She has been falling once a week for the klast few  Weeks. Work-up was negative including CT head.  She has a stable meningioma.  Apparently the patient remained confused and was sent to the ER again today.  Patient's UA chest x-ray and flu are all normal but her white blood cell count has increased to 14.  The patient apparently is also having some hallucinations.  New medications include zoloft. Falling started after the zoloft which has been decreased by her PCP.  She does have a history of atrial fibrillation and a meningioma  Patient somnolent but arousable. Does not provided any history.   Hospital course  #Altered mental status-probably from dehydration/subacute stroke probably embolic , sundowning and vascular dementia Clinically much better Gentle hydration with IV fluids  provided Neurochecks Neurology consult placed not recommending any anticoagulation though it seems to be embolic subacute stroke given elderly age and multiple falls high risk of bleeding.  Okay to discharge patient from neurology standpoint Baby aspirin enteric-coated once daily Follow-up with neurology outpatient Fasting lipid panel -LDL 87 PT is recommending skilled nursing facility Speech therapy recommending regular diet with moist and foods and Meats, intermittent supervision  #Possible sepsis-rule out Blood cultures are negative and urine cultures neg Empiric IV levofloxacin-changed to p.o., discontinue antibiotics Lactic acid 1.3 Flu test is negative TSH is normal  #History of dementia Continue close monitoring  #Essential hypertension Blood pressure is better Continue home medication metoprolol, ramipril titrate as needed Resume home medication hydrochlorothiazide   Disposition [facility]  DISCHARGE CONDITIONS:   fair  CONSULTS OBTAINED:  Treatment Team:  Leotis Pain, MD   PROCEDURES  None   DRUG ALLERGIES:   Allergies  Allergen Reactions  . Amoxil [Amoxicillin] Anxiety  . Codeine   . Pacerone  [Amiodarone Hcl] Nausea And Vomiting  . Sulfa Drugs Cross Reactors   . Amlodipine Other (See Comments)    Mild edema    DISCHARGE MEDICATIONS:   Allergies as of 11/30/2018      Reactions   Amoxil [amoxicillin] Anxiety   Codeine    Pacerone  [amiodarone Hcl] Nausea And Vomiting   Sulfa Drugs Cross Reactors    Amlodipine Other (See Comments)   Mild edema      Medication List    TAKE  these medications   acetaminophen 325 MG tablet Commonly known as:  TYLENOL Take 325-650 mg by mouth every 4 (four) hours as needed for mild pain or fever.   alum & mag hydroxide-simeth 200-200-20 MG/5ML suspension Commonly known as:  MAALOX/MYLANTA Take 30 mLs by mouth every 4 (four) hours as needed for indigestion or heartburn.   aspirin 81 MG EC  tablet Take 1 tablet (81 mg total) by mouth daily.   atorvastatin 10 MG tablet Commonly known as:  LIPITOR Take 1 tablet (10 mg total) by mouth daily.   carbamide peroxide 6.5 % OTIC solution Commonly known as:  DEBROX Place 5 drops into both ears daily.   CENTRUM SILVER PO Take 1 tablet by mouth daily. (1800)   diphenhydrAMINE 25 mg capsule Commonly known as:  BENADRYL Take 25 mg by mouth every 4 (four) hours as needed for itching or allergies.   famotidine 20 MG tablet Commonly known as:  PEPCID Take 1 tablet (20 mg total) by mouth 2 (two) times daily. What changed:  additional instructions   hydrochlorothiazide 25 MG tablet Commonly known as:  HYDRODIURIL Take 25 mg by mouth daily. Take 25 mg by mouth in the morning if pedal edema is noted   KAOPECTATE 262 MG/15ML suspension Generic drug:  bismuth subsalicylate Take 10 mLs by mouth every 6 (six) hours as needed.   magnesium hydroxide 400 MG/5ML suspension Commonly known as:  MILK OF MAGNESIA Take 30 mLs by mouth daily as needed for mild constipation.   Melatonin 5 MG Caps Take 5 mg by mouth at bedtime as needed. (2100)   metoprolol succinate 50 MG 24 hr tablet Commonly known as:  TOPROL-XL Take 1 tablet (50 mg total) by mouth daily with supper. Take with or immediately following a meal. What changed:  additional instructions   nystatin powder Commonly known as:  MYCOSTATIN/NYSTOP Apply 1 Bottle topically 2 (two) times daily. Under breast and to groin/abdominal fold areas as needed for rash   ondansetron 4 MG tablet Commonly known as:  ZOFRAN Take 4 mg by mouth 3 (three) times daily as needed for nausea.   polyethylene glycol packet Commonly known as:  MIRALAX / GLYCOLAX Take 17 g by mouth daily. (0700)   ramipril 10 MG capsule Commonly known as:  ALTACE TAKE ONE CAPSULE BY MOUTH ONCE DAILY What changed:  additional instructions   sennosides-docusate sodium 8.6-50 MG tablet Commonly known as:   SENOKOT-S Take 2 tablets by mouth daily.   sertraline 25 MG tablet Commonly known as:  ZOLOFT Take 25 mg by mouth daily. Take one tablet by mouth every day for 1 week and then every other day for 1 week and then stop. (0700)   timolol 0.5 % ophthalmic solution Commonly known as:  TIMOPTIC Place 1 drop into both eyes daily. (1630)   TUSSIN DM 10-100 MG/5ML liquid Generic drug:  dextromethorphan-guaiFENesin Take 10 mLs by mouth every 4 (four) hours as needed for cough.        DISCHARGE INSTRUCTIONS:  Follow-up with primary care physician at the facility in 3 to 4 days Follow-up with outpatient neurology in 2 to 3 weeks   DIET:  Cardiac diet with moist and foods and Meats, intermittent supervision  DISCHARGE CONDITION:  Fair  ACTIVITY:  Activity as tolerated  per PT  OXYGEN:  Home Oxygen: No.   Oxygen Delivery: room air  DISCHARGE LOCATION:  nursing home   If you experience worsening of your admission symptoms, develop shortness of breath, life  threatening emergency, suicidal or homicidal thoughts you must seek medical attention immediately by calling 911 or calling your MD immediately  if symptoms less severe.  You Must read complete instructions/literature along with all the possible adverse reactions/side effects for all the Medicines you take and that have been prescribed to you. Take any new Medicines after you have completely understood and accpet all the possible adverse reactions/side effects.   Please note  You were cared for by a hospitalist during your hospital stay. If you have any questions about your discharge medications or the care you received while you were in the hospital after you are discharged, you can call the unit and asked to speak with the hospitalist on call if the hospitalist that took care of you is not available. Once you are discharged, your primary care physician will handle any further medical issues. Please note that NO REFILLS for any  discharge medications will be authorized once you are discharged, as it is imperative that you return to your primary care physician (or establish a relationship with a primary care physician if you do not have one) for your aftercare needs so that they can reassess your need for medications and monitor your lab values.     Today  Chief Complaint  Patient presents with  . Altered Mental Status   Patient is pleasantly confused from underlying vascular dementia.  Son admits that patient intermittently get confused which is her baseline Okay to discharge patient from neurology standpoint and son is agreeable  ROS: Limited given the underlying dementia CONSTITUTIONAL: Denies  weakness.  EYES: Denies blurry vision, eye pain. EARS, NOSE, THROAT: Denies tinnitus RESPIRATORY: Denies cough, wheeze, shortness of breath.  CARDIOVASCULAR: Denies chest pain.  GASTROINTESTINAL: Denies nausea, vomiting, diarrhea, abdominal pain. Denies bright red blood per rectum. MUSCULOSKELETAL: Denies any pain  NEUROLOGIC: Denies paralysis, paresthesias.     VITAL SIGNS:  Blood pressure (!) 164/76, pulse 72, temperature 98 F (36.7 C), resp. rate 20, height 5\' 8"  (1.727 m), weight 62.4 kg, SpO2 97 %.  I/O:    Intake/Output Summary (Last 24 hours) at 11/30/2018 0950 Last data filed at 11/29/2018 1837 Gross per 24 hour  Intake 360 ml  Output 1500 ml  Net -1140 ml    PHYSICAL EXAMINATION:  GENERAL:  84 y.o.-year-old patient lying in the bed with no acute distress.  EYES: Pupils equal, round, reactive to light and accommodation. No scleral icterus. Extraocular muscles intact.  HEENT: Head atraumatic, normocephalic. Oropharynx and nasopharynx clear.  NECK:  Supple, no jugular venous distention. No thyroid enlargement, no tenderness.  LUNGS: Normal breath sounds bilaterally, no wheezing, rales,rhonchi or crepitation. No use of accessory muscles of respiration.  CARDIOVASCULAR: S1, S2 normal. No murmurs,  rubs, or gallops.  ABDOMEN: Soft, non-tender, non-distended. Bowel sounds present. EXTREMITIES: No pedal edema, cyanosis, or clubbing.  NEUROLOGIC: Awake and alert and oriented x1 sensation intact. Gait not checked.  PSYCHIATRIC: The patient is alert and oriented x 1.  SKIN: No obvious rash, lesion, or ulcer.   DATA REVIEW:   CBC Recent Labs  Lab 11/29/18 0548  WBC 11.7*  HGB 15.0  HCT 45.5  PLT 714*    Chemistries  Recent Labs  Lab 11/27/18 1122 11/28/18 0421 11/29/18 0548  NA 135 136 135  K 3.5 3.4* 4.1  CL 100 102 103  CO2 28 26 24   GLUCOSE 131* 101* 101*  BUN 21 15 16   CREATININE 0.57 0.34* 0.44  CALCIUM 8.6* 8.3* 8.5*  MG  --  1.9  --   AST 28  --   --   ALT 15  --   --   ALKPHOS 52  --   --   BILITOT 0.7  --   --     Cardiac Enzymes Recent Labs  Lab 11/26/18 1011  TROPONINI <0.03    Microbiology Results  Results for orders placed or performed during the hospital encounter of 11/27/18  Culture, blood (Routine X 2) w Reflex to ID Panel     Status: None (Preliminary result)   Collection Time: 11/27/18  5:03 PM  Result Value Ref Range Status   Specimen Description BLOOD LT [facility]  Final   Special Requests   Final    BOTTLES DRAWN AEROBIC AND ANAEROBIC Blood Culture adequate volume   Culture   Final    NO GROWTH 3 DAYS Performed at [facility], [address] [place], [place] [address]    Report Status PENDING  Incomplete  Culture, blood (Routine X 2) w Reflex to ID Panel     Status: None (Preliminary result)   Collection Time: 11/27/18  5:11 PM  Result Value Ref Range Status   Specimen Description BLOOD RT HAND  Final   Special Requests   Final    BOTTLES DRAWN AEROBIC AND ANAEROBIC Blood Culture adequate volume   Culture   Final    NO GROWTH 3 DAYS Performed at [facility], [address], [place], [place] [address]    Report Status PENDING  Incomplete  Urine Culture     Status: None   Collection Time: 11/28/18 10:45 PM   Result Value Ref Range Status   Specimen Description   Final    URINE, RANDOM Performed at [facility], [address], [place], [place] [address]    Special Requests   Final    Normal Performed at [facility], [address], [place], [place] [address]    Culture   Final    NO GROWTH Performed at [facility], [place] [address], [place],  [address]    Report Status 11/30/2018 FINAL  Final    RADIOLOGY:  Dg Chest 1 View  Result Date: 11/27/2018 CLINICAL DATA:  Altered mental status. EXAM: CHEST  1 VIEW COMPARISON:  July 22, 2016 and November 26, 2017 FINDINGS: Minimal atelectasis in the left base. The heart, hila, mediastinum, lungs, and pleura are otherwise unremarkable and unchanged. IMPRESSION: Minimal atelectasis in the left base.  No other acute abnormalities. Electronically Signed   By: Dorise Bullion III M.D   On: 11/27/2018 14:24   Ct Head Wo Contrast  Result Date: 11/27/2018 CLINICAL DATA:  Confusion, hallucinations. EXAM: CT HEAD WITHOUT CONTRAST TECHNIQUE: Contiguous axial images were obtained from the base of the skull through the vertex without intravenous contrast. COMPARISON:  11/26/2018 FINDINGS: Brain: There is a slightly hyperdense rounded structure/mass in the right temporal occipital region measuring up to 3.1 cm compatible with meningioma, stable since prior study. There is atrophy and chronic small vessel disease changes. No hemorrhage, hydrocephalus or acute infarction. Vascular: No hyperdense vessel or unexpected calcification. Skull: No acute calvarial abnormality. Sinuses/Orbits: Visualized paranasal sinuses and mastoids clear. Orbital soft tissues unremarkable. Other: None IMPRESSION: 3 cm right temporal occipital meningioma, stable. Atrophy, chronic small vessel disease. No acute intracranial abnormality. Electronically Signed   By: Rolm Baptise M.D.   On: 11/27/2018 16:49   Ct Head Wo Contrast  Result Date:  11/26/2018 CLINICAL DATA:  84 year old female with  altered mental status, multiple recent falls EXAM: CT HEAD WITHOUT CONTRAST TECHNIQUE: Contiguous axial images were obtained from the base of the skull through the vertex without intravenous contrast. COMPARISON:  Prior head CT 11/26/2017; brain MRI 11/26/2017 FINDINGS: Brain: No evidence of acute infarction, hemorrhage, hydrocephalus, extra-axial collection or mass lesion/mass effect. Stable approximately 3 cm hyperdense rounded soft tissue structure arising from the dura of the right temporal occipital region. Correlation with prior MR imaging demonstrates a meningioma in the same location. No significant interval change. Similar degree of cerebral and cerebellar cortical atrophy. Periventricular, subcortical and deep white matter hypoattenuation are also stable and remain consistent with chronic microvascular ischemic white matter disease. Vascular: Atherosclerotic calcifications in the bilateral cavernous and supraclinoid internal carotid arteries. Skull: Normal. Negative for fracture or focal lesion. Sinuses/Orbits: No acute finding. Other: None. IMPRESSION: 1. No acute intracranial abnormality. 2. Stable approximately 3 cm right temporal occipital meningioma. 3. Similar degree of atrophy and chronic microvascular ischemic white matter disease. Electronically Signed   By: Jacqulynn Cadet M.D.   On: 11/26/2018 10:49   Ct Pelvis Wo Contrast  Result Date: 11/26/2018 CLINICAL DATA:  84 year old female with multiple recent falls and right hip bruising. EXAM: CT PELVIS WITHOUT CONTRAST TECHNIQUE: Multidetector CT imaging of the pelvis was performed following the standard protocol without intravenous contrast. COMPARISON:  Recent prior CT scan of the abdomen and pelvis 07/03/2018 and 10/27/2013 FINDINGS: Urinary Tract: Limited evaluation in the absence of intravenous contrast. Unremarkable appearance of the bladder. Bowel:  No focal bowel wall thickening or  evidence of obstruction. Vascular/Lymphatic: Limited evaluation in the absence of intravenous contrast. Atherosclerotic vascular calcifications are noted throughout the aorta and branch vessels. Reproductive: Unremarkable uterus. Multiple circumscribed low-attenuation cystic lesions affiliated with both ovaries. The largest affiliated with the right ovary remains stable at approximately 3 cm. The smaller cystic lesions remain unchanged dating back to November of 2014. Other:  None. Musculoskeletal: Advanced lower lumbar degenerative disc disease. No definite acute fracture or malalignment. IMPRESSION: 1. No acute fracture identified. Please note that in the setting of generalized osteopenia, nondisplaced fractures may remain occult by CT imaging. If there is persistent clinical concern (patient unable to bear weight) consider further evaluation with MRI. 2. Multiple ovarian cysts which remain grossly stable across multiple prior studies. 3.  Aortic Atherosclerosis (ICD10-170.0). 4. Advanced lower lumbar degenerative disc disease. Electronically Signed   By: Jacqulynn Cadet M.D.   On: 11/26/2018 13:39   Mr Brain Wo Contrast  Result Date: 11/29/2018 CLINICAL DATA:  Worsening confusion. Frequent falls. History of meningioma. EXAM: MRI HEAD WITHOUT CONTRAST TECHNIQUE: Multiplanar, multiecho pulse sequences of the brain and surrounding structures were obtained without intravenous contrast. COMPARISON:  CT HEAD November 27, 2018 and MRI of the head November 26, 2017 FINDINGS: Multiple sequences are moderately motion degraded. INTRACRANIAL CONTENTS: Patchy reduced diffusion LEFT greater than RIGHT parietal lobes, RIGHT frontal lobe with normalized ADC values. Reduced diffusion and low ADC values associated with RIGHT temporal occipital lobe meningioma better characterized on prior contrast enhanced examination. Similar regional mass effect without midline shift. Patchy supratentorial and pontine white matter T2  hyperintensities. Old small LEFT cerebellar infarct. No parenchymal brain volume loss for age. No abnormal extra-axial fluid collections. Stable asymmetrically prominent LEFT posterior fossa extra-axial space. VASCULAR: Normal major intracranial vascular flow voids present at skull base. SKULL AND UPPER CERVICAL SPINE: No abnormal sellar expansion. No suspicious calvarial bone marrow signal. Craniocervical junction maintained. SINUSES/ORBITS: Small LEFT mastoid effusion. Included ocular globes and orbital  contents are non-suspicious. Status post bilateral ocular lens implants. Tortuous optic nerve she seen with intracranial hypertension. OTHER: None. IMPRESSION: 1. Motion degraded examination. 2. Subacute small biparietal and RIGHT frontal lobe infarcts, suggesting embolic phenomena. 3. Stable noncontrast appearance of RIGHT temporal occipital lobe meningioma. 4. Mild-to-moderate chronic small vessel ischemic changes. Old small LEFT cerebellar infarct. Electronically Signed   By: Elon Alas M.D.   On: 11/29/2018 05:54   Ct Abdomen Pelvis W Contrast  Result Date: 11/27/2018 CLINICAL DATA:  Abdominal pain. Possible UTI. EXAM: CT ABDOMEN AND PELVIS WITH CONTRAST TECHNIQUE: Multidetector CT imaging of the abdomen and pelvis was performed using the standard protocol following bolus administration of intravenous contrast. CONTRAST:  123mL ISOVUE-300 IOPAMIDOL (ISOVUE-300) INJECTION 61% COMPARISON:  CT abdomen pelvis 11/26/2018 FINDINGS: LOWER CHEST: There is no basilar pleural or apical pericardial effusion. HEPATOBILIARY: The hepatic contours and density are normal. There is no intra- or extrahepatic biliary dilatation. The gallbladder is normal. PANCREAS: The pancreatic parenchymal contours are normal and there is no ductal dilatation. There is no peripancreatic fluid collection. SPLEEN: Normal. ADRENALS/URINARY TRACT: --Adrenal glands: Normal. --Right kidney/ureter: No hydronephrosis,  nephroureterolithiasis, perinephric stranding or solid renal mass. --Left kidney/ureter: There is an interpolar cyst measuring 3.7 cm. No hydronephrosis, nephroureterolithiasis, perinephric stranding or solid renal mass. --Urinary bladder: Normal for degree of distention STOMACH/BOWEL: --Stomach/Duodenum: There is no hiatal hernia or other gastric abnormality. The duodenal course and caliber are normal. --Small bowel: No dilatation or inflammation. --Colon: No focal abnormality. --Appendix: Normal. VASCULAR/LYMPHATIC: Atherosclerotic calcification is present within the non-aneurysmal abdominal aorta, without hemodynamically significant stenosis. No abdominal or pelvic lymphadenopathy. REPRODUCTIVE: Normal uterus and ovaries. MUSCULOSKELETAL. Multilevel degenerative disc disease and facet arthrosis. No bony spinal canal stenosis. OTHER: None. IMPRESSION: No acute abdominal or pelvic abnormality. Aortic atherosclerosis (ICD10-I70.0). Electronically Signed   By: Ulyses Jarred M.D.   On: 11/27/2018 17:03   Dg Chest Portable 1 View  Result Date: 11/26/2018 CLINICAL DATA:  Confusion and hypoxia.  Multiple falls. EXAM: PORTABLE CHEST 1 VIEW COMPARISON:  07/22/2016 FINDINGS: Normal heart size. Decreased lung volumes. There is no pleural effusion or edema. No airspace opacities noted. IMPRESSION: 1. Lungs are hypoinflated but clear. Electronically Signed   By: Kerby Moors M.D.   On: 11/26/2018 10:45   Dg Hip Unilat W Or Wo Pelvis 2-3 Views Right  Result Date: 11/26/2018 CLINICAL DATA:  84 year old female with right hip bruise EXAM: DG HIP (WITH OR WITHOUT PELVIS) 2-3V RIGHT COMPARISON:  Prior CT abdomen/pelvis 07/03/2018 FINDINGS: The bones appear diffusely osteopenic. No evidence of acute fracture or malalignment. The bony pelvis is intact. Multiple vascular phleboliths project over the pelvis. The right femoral head is located. IMPRESSION: 1. No evidence of acute fracture or malalignment. 2. The bones appear  demineralized. 3. Mild atherosclerotic vascular calcifications. Electronically Signed   By: Jacqulynn Cadet M.D.   On: 11/26/2018 12:27    EKG:   Orders placed or performed during the hospital encounter of 11/26/18  . ED EKG  . ED EKG  . EKG 12-Lead  . EKG 12-Lead  . EKG      Management plans discussed with the patient, family and they are in agreement.  CODE STATUS:     Code Status Orders  (From admission, onward)         Start     Ordered   11/27/18 2245  Do not attempt resuscitation (DNR)  Continuous    Question Answer Comment  In the event of cardiac or respiratory ARREST  Do not call a "code blue"   In the event of cardiac or respiratory ARREST Do not perform Intubation, CPR, defibrillation or ACLS   In the event of cardiac or respiratory ARREST Use medication by any route, position, wound care, and other measures to relive pain and suffering. May use oxygen, suction and manual treatment of airway obstruction as needed for comfort.      11/27/18 2244        Code Status History    Date Active Date Inactive Code Status Order ID Comments User Context   11/26/2017 1939 11/27/2017 2013 Full Code [id number]  Demetrios Loll, MD Inpatient    Advance Directive Documentation     Most Recent Value  Type of Advance Directive  Healthcare Power of [place], Out of facility DNR (pink MOST or yellow form)  Pre-existing out of facility DNR order (yellow form or pink MOST form)  -  "MOST" Form in Place?  -      TOTAL TIME TAKING CARE OF THIS PATIENT: 45 minutes.   Note: This dictation was prepared with Dragon dictation along with smaller phrase technology. Any transcriptional errors that result from this process are unintentional.   @[facility] @  on 11/30/2018 at 9:50 AM  Between 7am to 6pm - Pager - [phone]  After 6pm go to [email or web address] - password [email or web address] [place] [facility]  Office  [phone]  CC: Primary care physician; Venia Carbon, MD

## 2018-12-01 DIAGNOSIS — I6340 Cerebral infarction due to embolism of unspecified cerebral artery: Secondary | ICD-10-CM | POA: Diagnosis not present

## 2018-12-02 LAB — CULTURE, BLOOD (ROUTINE X 2)
Culture: NO GROWTH
Culture: NO GROWTH
Special Requests: ADEQUATE
Special Requests: ADEQUATE

## 2018-12-05 DIAGNOSIS — D473 Essential (hemorrhagic) thrombocythemia: Secondary | ICD-10-CM

## 2018-12-05 DIAGNOSIS — I69398 Other sequelae of cerebral infarction: Secondary | ICD-10-CM

## 2018-12-05 DIAGNOSIS — I10 Essential (primary) hypertension: Secondary | ICD-10-CM

## 2018-12-05 DIAGNOSIS — F39 Unspecified mood [affective] disorder: Secondary | ICD-10-CM | POA: Diagnosis not present

## 2018-12-05 DIAGNOSIS — I480 Paroxysmal atrial fibrillation: Secondary | ICD-10-CM

## 2018-12-20 IMAGING — CT CT HEAD W/O CM
3 series · 15 of 47 positions shown, 18 images · non-contrast
Comparison: Prior head CT 11/26/2017; brain MRI 11/26/2017

CLINICAL DATA: [AGE] female with altered mental status,
multiple recent falls

EXAM:
CT HEAD WITHOUT CONTRAST
TECHNIQUE: Contiguous axial images were obtained from the base of the skull
through the vertex without intravenous contrast.

[Series 2: head wo · axial · 0.40mm/px · z∈[+284,+414]mm · 9 of 32 slices shown, 12 images]
[im 3/32  brain]
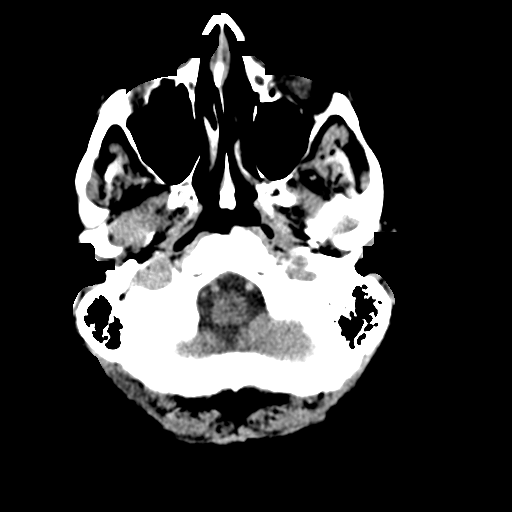
[im 3/32  bone]
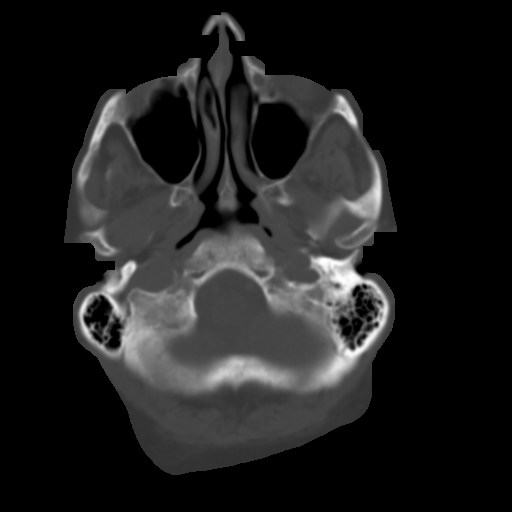
[im 6/32  brain]
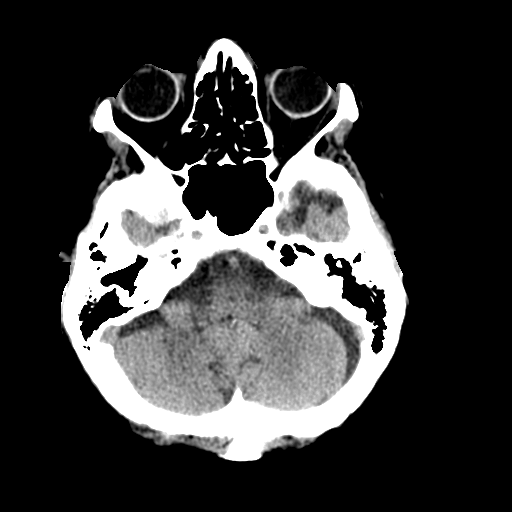
[im 9/32  brain]
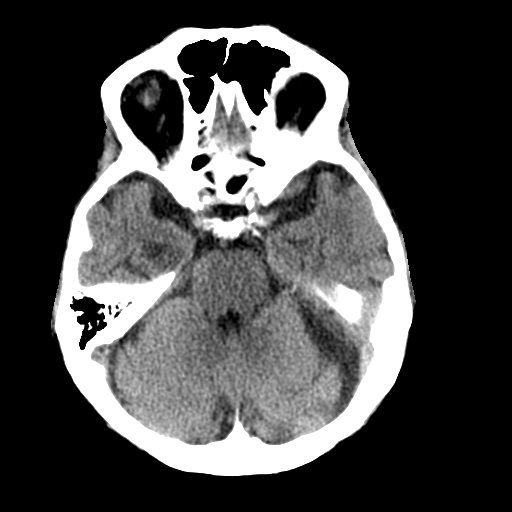
[im 12/32  brain]
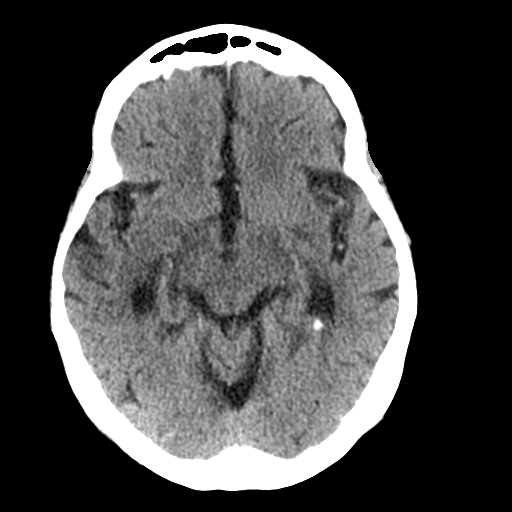
[im 17/32  brain]
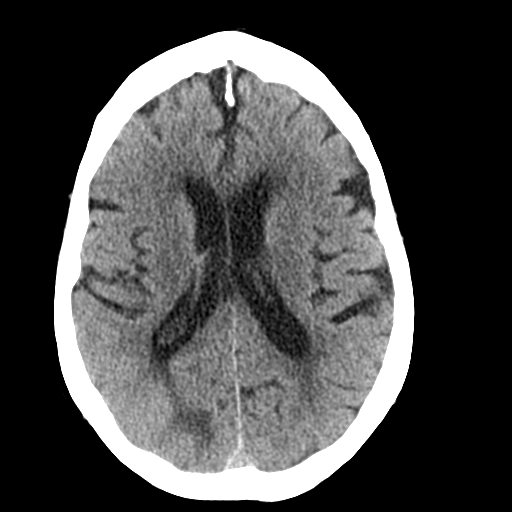
[im 17/32  bone]
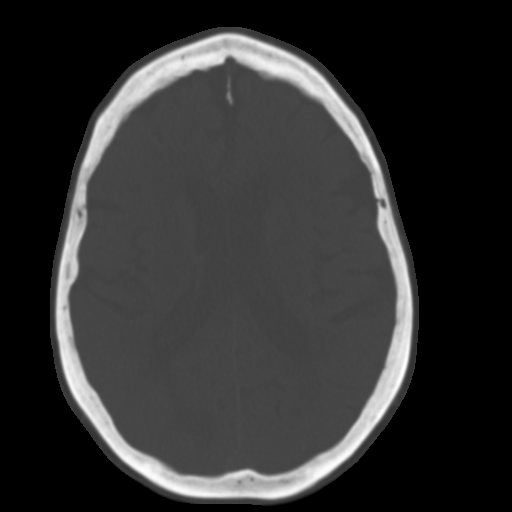
[im 20/32  brain]
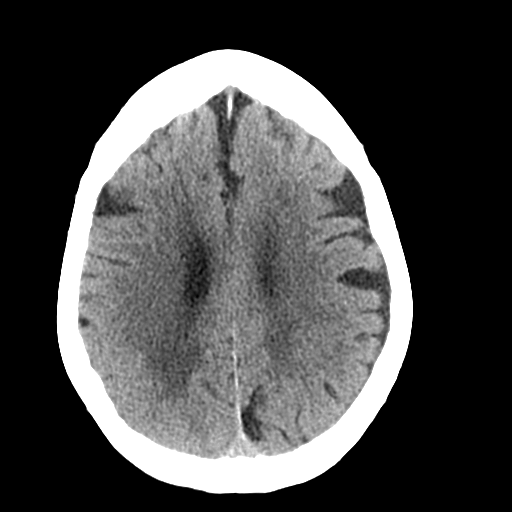
[im 23/32  brain]
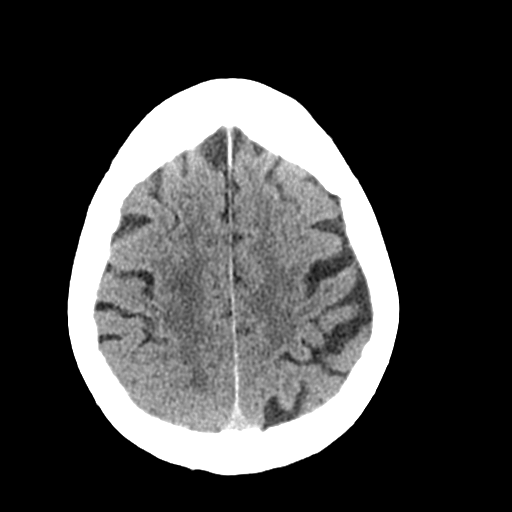
[im 26/32  brain]
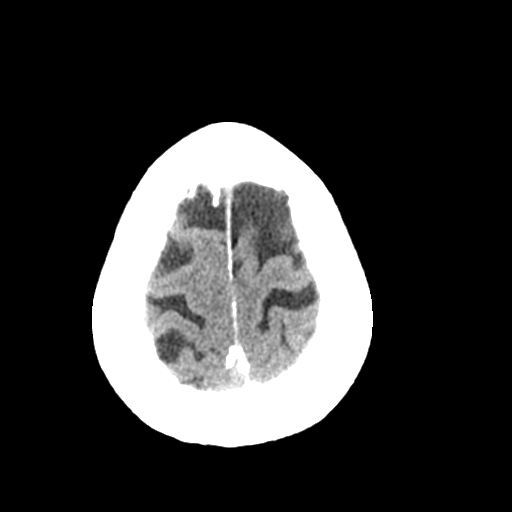
[im 29/32  brain]
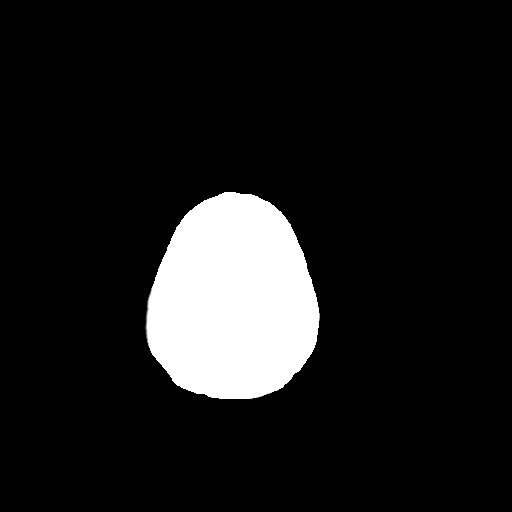
[im 29/32  bone]
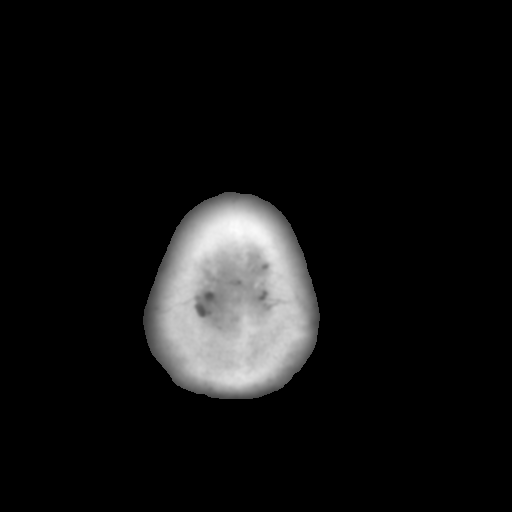

[Series 4: coronal soft tissue · coronal · 0.31mm/px · 3 of 61 slices shown]
[im 21/61  brain]
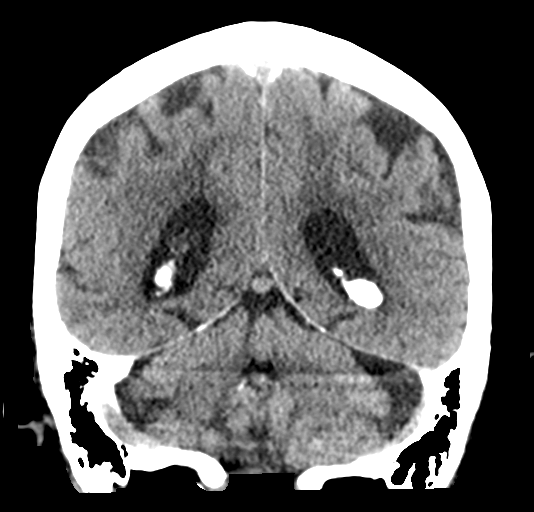
[im 27/61  brain]
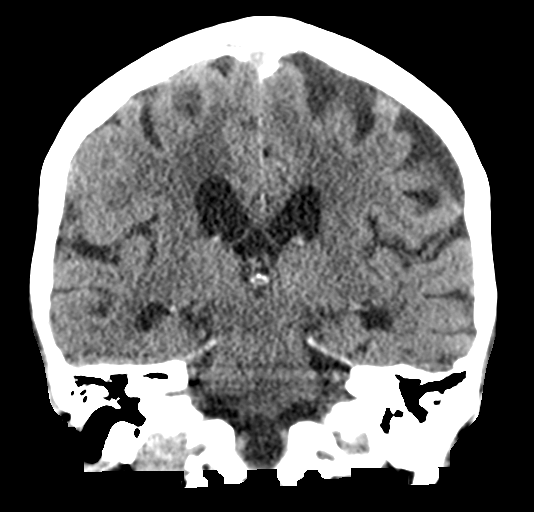
[im 34/61  brain]
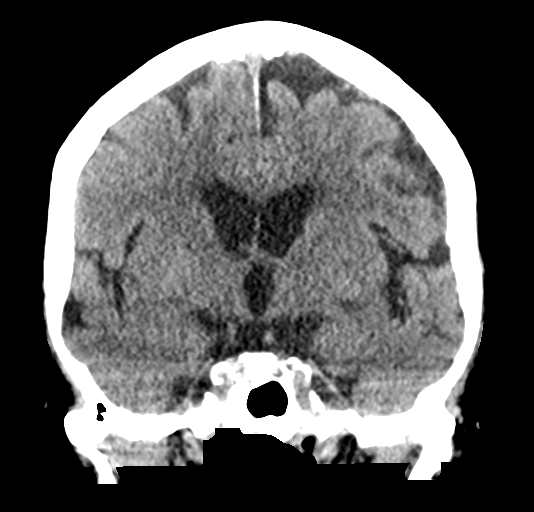

[Series 5: sagittal soft tissue · sagittal · 0.31mm/px · 3 of 48 slices shown]
[im 16/48  brain]
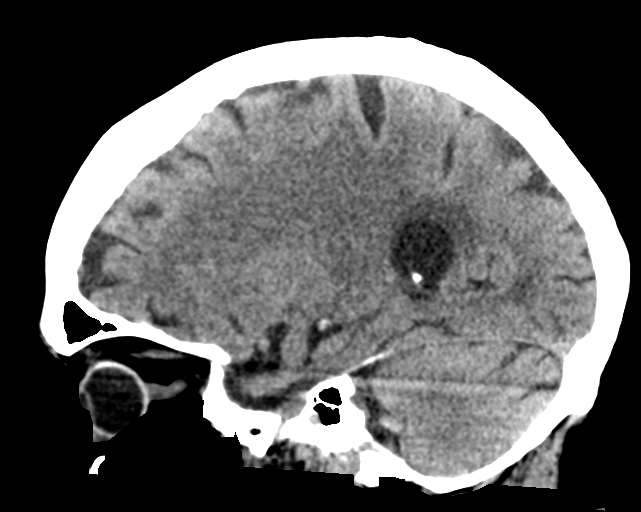
[im 24/48  brain]
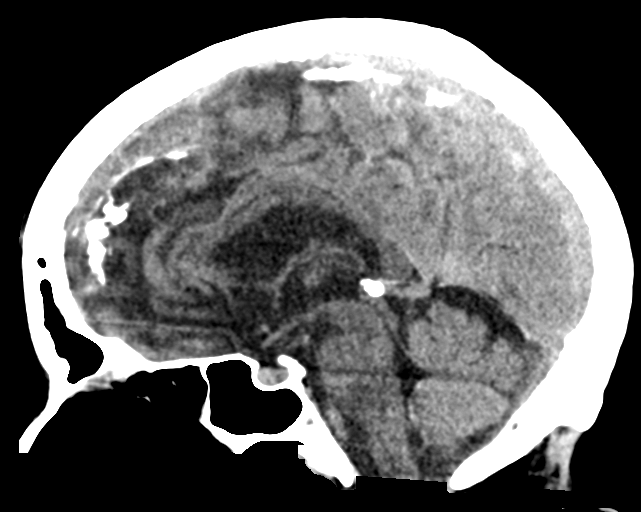
[im 32/48  brain]
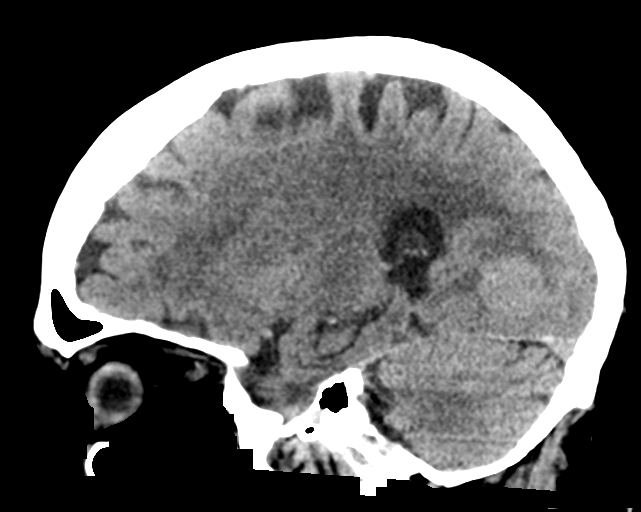

[15 of 47 positions shown; findings below may reference images not displayed]

FINDINGS: Brain: No evidence of acute infarction, hemorrhage, hydrocephalus,
extra-axial collection or mass lesion/mass effect. Stable
approximately 3 cm hyperdense rounded soft tissue structure arising
from the dura of the right temporal occipital region. Correlation
with prior MR imaging demonstrates a meningioma in the same
location. No significant interval change. Similar degree of cerebral
and cerebellar cortical atrophy. Periventricular, subcortical and
deep white matter hypoattenuation are also stable and remain
consistent with chronic microvascular ischemic white matter disease.

Vascular: Atherosclerotic calcifications in the bilateral cavernous
and supraclinoid internal carotid arteries.

Skull: Normal. Negative for fracture or focal lesion.

Sinuses/Orbits: No acute finding.

Other: None.
IMPRESSION: 1. No acute intracranial abnormality.
2. Stable approximately 3 cm right temporal occipital meningioma.
3. Similar degree of atrophy and chronic microvascular ischemic
white matter disease.

## 2018-12-20 IMAGING — CT CT PELVIS W/O CM
2 of 3 series · 16 of 46 positions shown, 18 images · non-contrast
Comparison: Recent prior CT scan of the abdomen and pelvis
07/03/2018 and 10/27/2013

CLINICAL DATA: [AGE] female with multiple recent falls and
right hip bruising.

EXAM:
CT PELVIS WITHOUT CONTRAST
TECHNIQUE: Multidetector CT imaging of the pelvis was performed following the
standard protocol without intravenous contrast.

[Series 3: axial st · axial · 0.82mm/px · z∈[-1131,-915]mm · 13 of 125 slices shown, 15 images]
[im 9/125  soft-tissue]
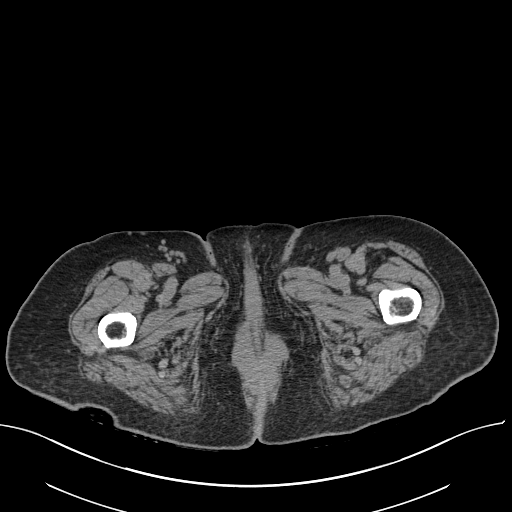
[im 9/125  bone]
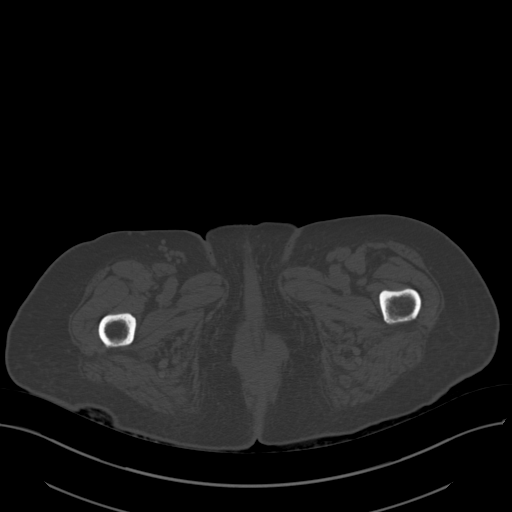
[im 17/125  soft-tissue]
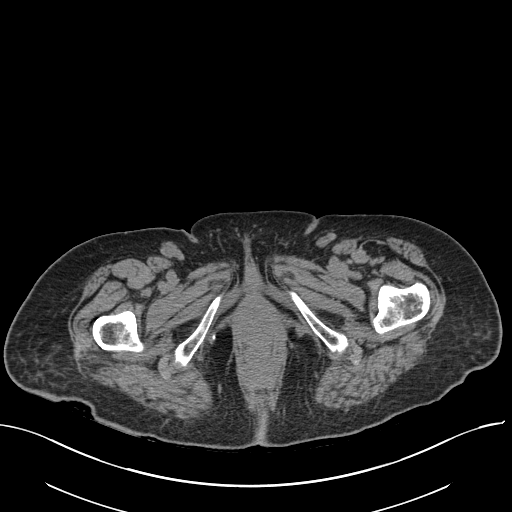
[im 25/125  soft-tissue]
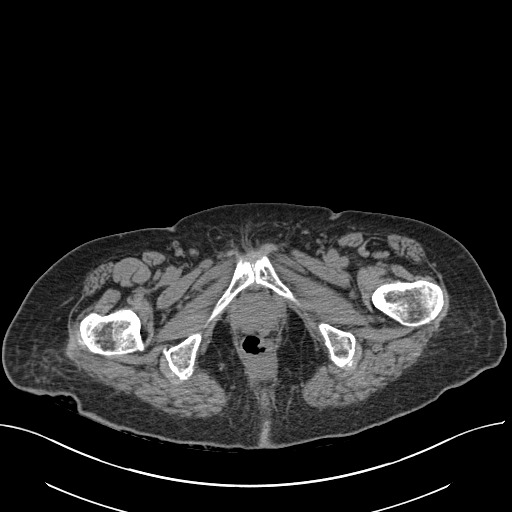
[im 37/125  soft-tissue]
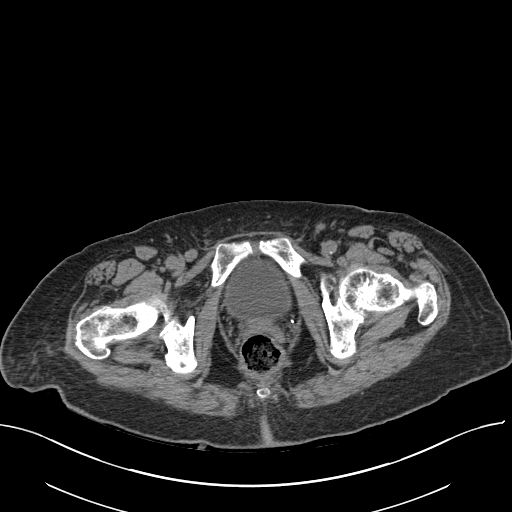
[im 45/125  soft-tissue]
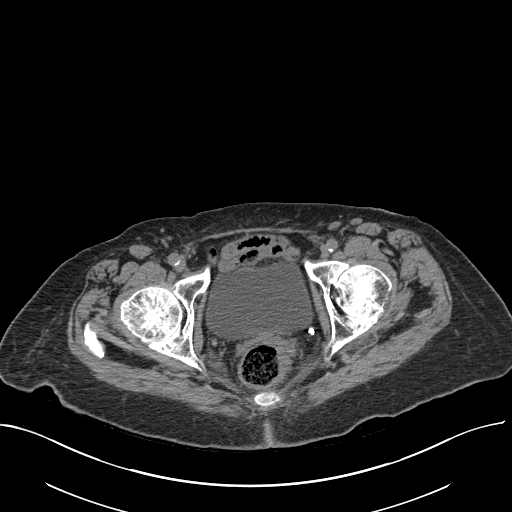
[im 53/125  soft-tissue]
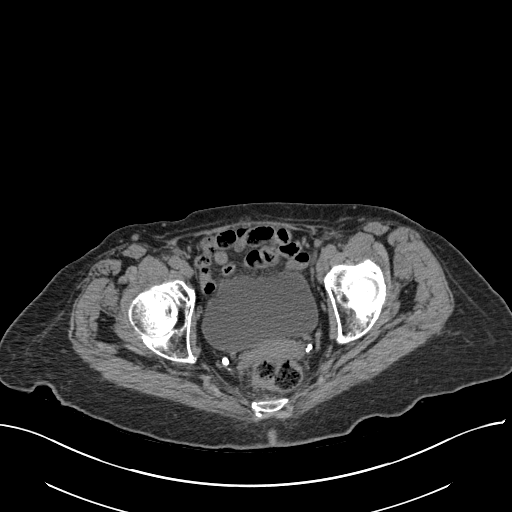
[im 65/125  soft-tissue]
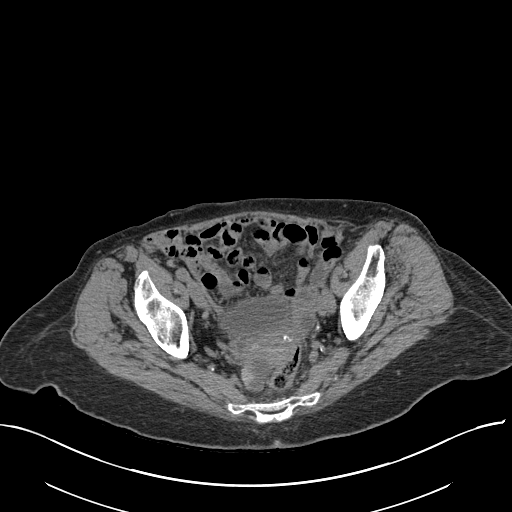
[im 73/125  soft-tissue]
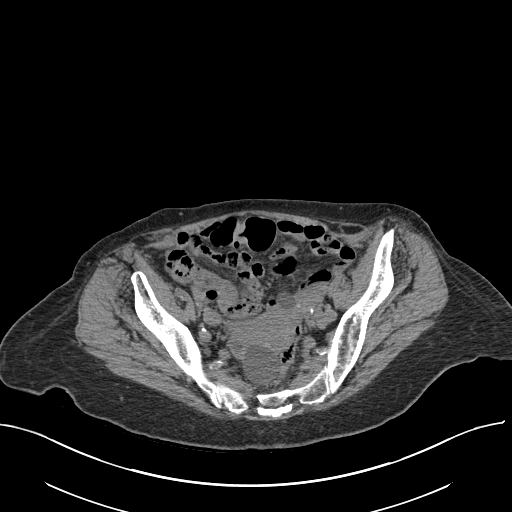
[im 81/125  soft-tissue]
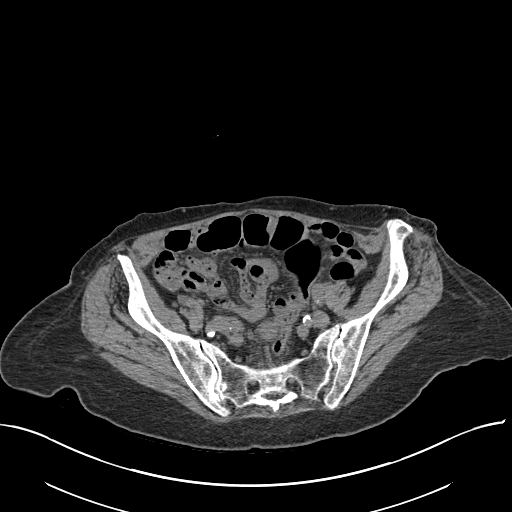
[im 81/125  bone]
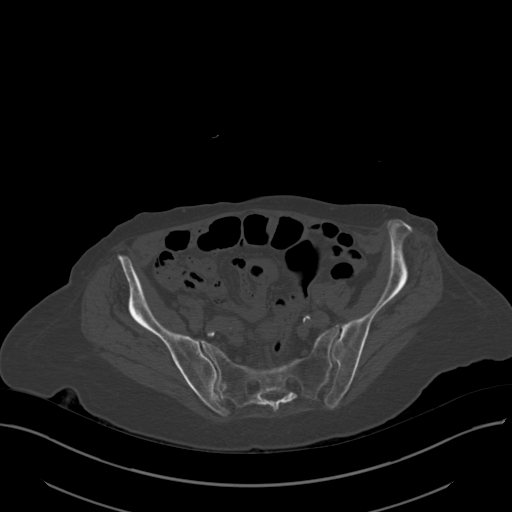
[im 89/125  soft-tissue]
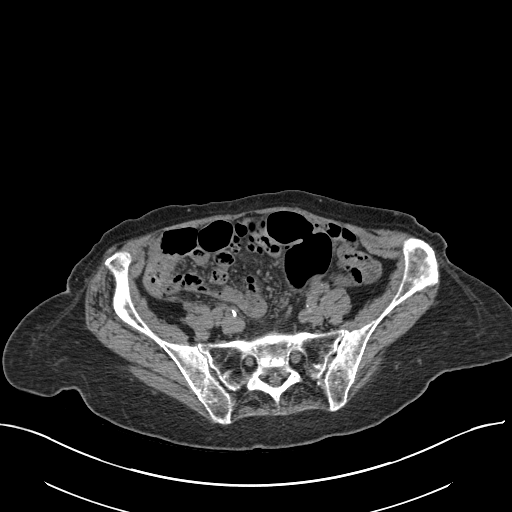
[im 101/125  soft-tissue]
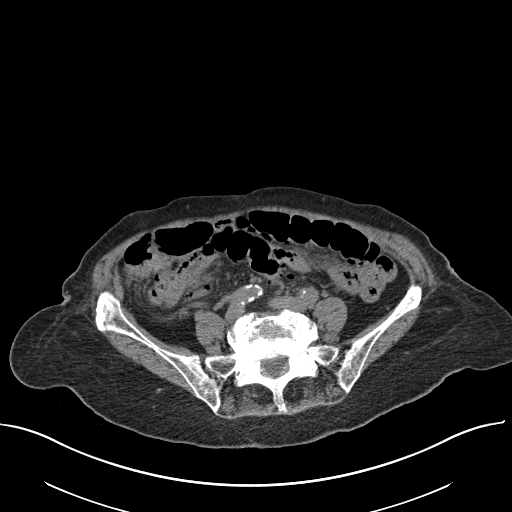
[im 109/125  soft-tissue]
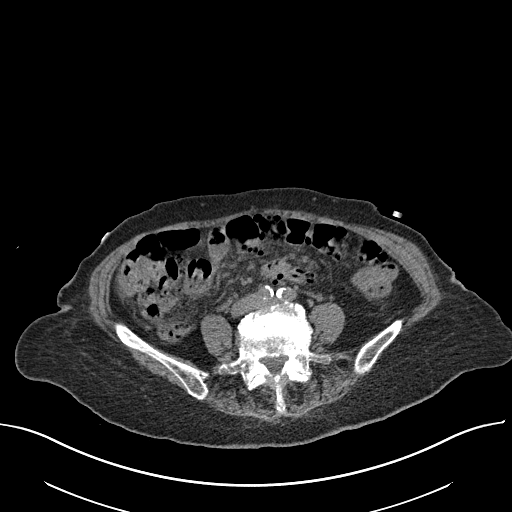
[im 117/125  soft-tissue]
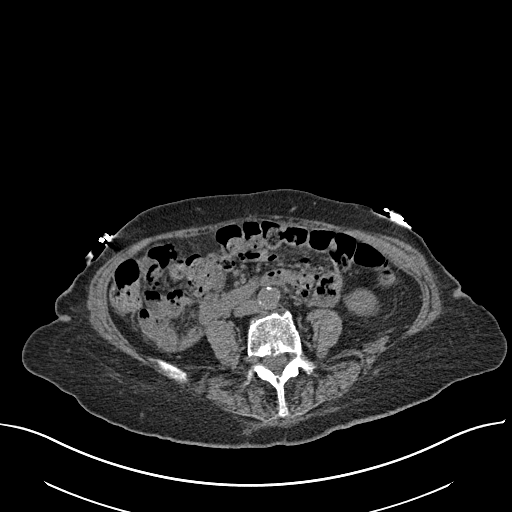

[Series 6: coronal st · coronal · 0.51mm/px · 3 of 100 slices shown]
[im 34/100  soft-tissue]
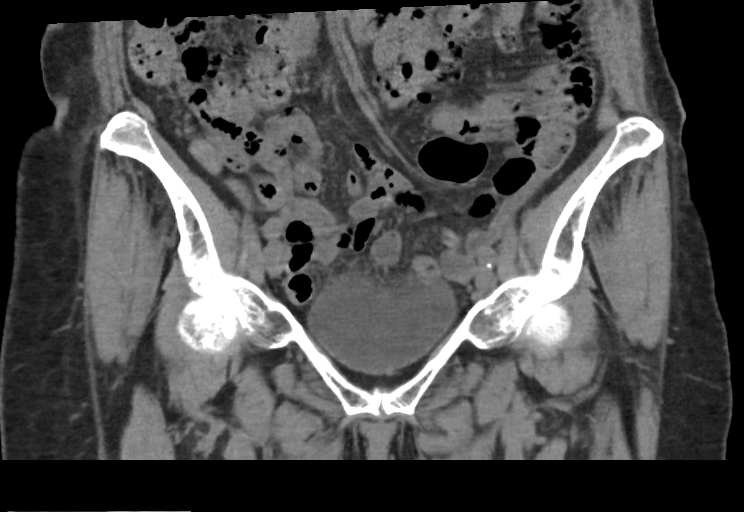
[im 45/100  soft-tissue]
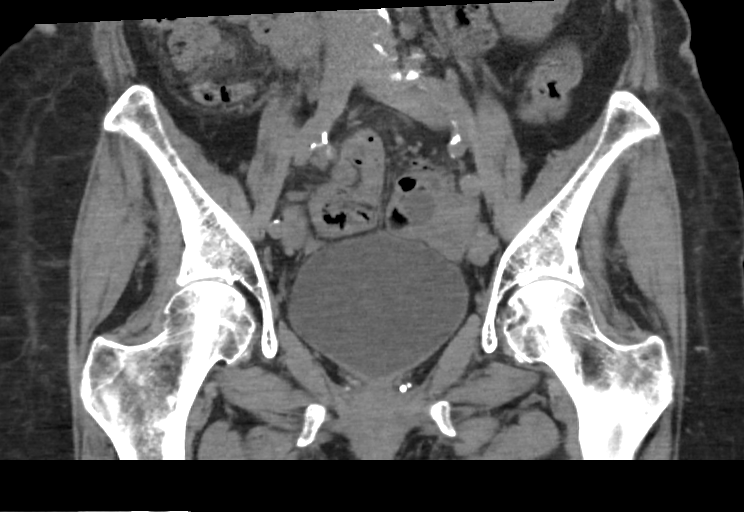
[im 56/100  soft-tissue]
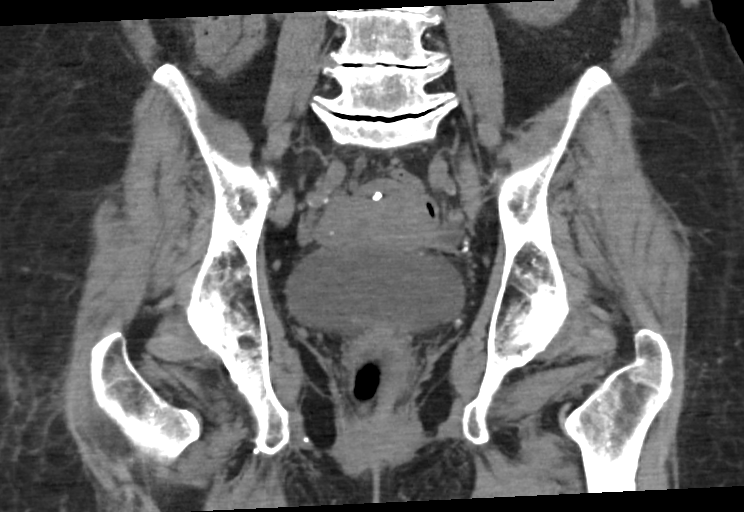

[16 of 46 positions shown; findings below may reference images not displayed]

FINDINGS: Urinary Tract: Limited evaluation in the absence of intravenous
contrast. Unremarkable appearance of the bladder.

Bowel:  No focal bowel wall thickening or evidence of obstruction.

Vascular/Lymphatic: Limited evaluation in the absence of intravenous
contrast. Atherosclerotic vascular calcifications are noted
throughout the aorta and branch vessels.

Reproductive: Unremarkable uterus. Multiple circumscribed
low-attenuation cystic lesions affiliated with both ovaries. The
largest affiliated with the right ovary remains stable at
approximately 3 cm. The smaller cystic lesions remain unchanged
dating back to Monday October, 2013.

Other:  None.

Musculoskeletal: Advanced lower lumbar degenerative disc disease. No
definite acute fracture or malalignment.
IMPRESSION: 1. No acute fracture identified. Please note that in the setting of
generalized osteopenia, nondisplaced fractures may remain occult by
CT imaging. If there is persistent clinical concern (patient unable
to bear weight) consider further evaluation with MRI.
2. Multiple ovarian cysts which remain grossly stable across
multiple prior studies.
3.  Aortic Atherosclerosis (NEPBP-170.0).
4. Advanced lower lumbar degenerative disc disease.

## 2018-12-21 DIAGNOSIS — R278 Other lack of coordination: Secondary | ICD-10-CM | POA: Diagnosis not present

## 2018-12-21 DIAGNOSIS — M6281 Muscle weakness (generalized): Secondary | ICD-10-CM | POA: Diagnosis not present

## 2018-12-21 DIAGNOSIS — F0150 Vascular dementia without behavioral disturbance: Secondary | ICD-10-CM | POA: Diagnosis not present

## 2018-12-21 DIAGNOSIS — Z8673 Personal history of transient ischemic attack (TIA), and cerebral infarction without residual deficits: Secondary | ICD-10-CM | POA: Diagnosis not present

## 2018-12-21 DIAGNOSIS — R4189 Other symptoms and signs involving cognitive functions and awareness: Secondary | ICD-10-CM | POA: Diagnosis not present

## 2018-12-21 DIAGNOSIS — Z741 Need for assistance with personal care: Secondary | ICD-10-CM | POA: Diagnosis not present

## 2018-12-21 DIAGNOSIS — R262 Difficulty in walking, not elsewhere classified: Secondary | ICD-10-CM | POA: Diagnosis not present

## 2018-12-22 DIAGNOSIS — R4189 Other symptoms and signs involving cognitive functions and awareness: Secondary | ICD-10-CM | POA: Diagnosis not present

## 2018-12-22 DIAGNOSIS — Z8673 Personal history of transient ischemic attack (TIA), and cerebral infarction without residual deficits: Secondary | ICD-10-CM | POA: Diagnosis not present

## 2018-12-22 DIAGNOSIS — R278 Other lack of coordination: Secondary | ICD-10-CM | POA: Diagnosis not present

## 2018-12-22 DIAGNOSIS — Z741 Need for assistance with personal care: Secondary | ICD-10-CM | POA: Diagnosis not present

## 2018-12-22 DIAGNOSIS — M6281 Muscle weakness (generalized): Secondary | ICD-10-CM | POA: Diagnosis not present

## 2018-12-22 DIAGNOSIS — R262 Difficulty in walking, not elsewhere classified: Secondary | ICD-10-CM | POA: Diagnosis not present

## 2018-12-26 DIAGNOSIS — M6281 Muscle weakness (generalized): Secondary | ICD-10-CM | POA: Diagnosis not present

## 2018-12-26 DIAGNOSIS — R278 Other lack of coordination: Secondary | ICD-10-CM | POA: Diagnosis not present

## 2018-12-26 DIAGNOSIS — Z8673 Personal history of transient ischemic attack (TIA), and cerebral infarction without residual deficits: Secondary | ICD-10-CM | POA: Diagnosis not present

## 2018-12-26 DIAGNOSIS — Z741 Need for assistance with personal care: Secondary | ICD-10-CM | POA: Diagnosis not present

## 2018-12-26 DIAGNOSIS — R4189 Other symptoms and signs involving cognitive functions and awareness: Secondary | ICD-10-CM | POA: Diagnosis not present

## 2018-12-26 DIAGNOSIS — R262 Difficulty in walking, not elsewhere classified: Secondary | ICD-10-CM | POA: Diagnosis not present

## 2018-12-27 DIAGNOSIS — Z8673 Personal history of transient ischemic attack (TIA), and cerebral infarction without residual deficits: Secondary | ICD-10-CM | POA: Diagnosis not present

## 2018-12-27 DIAGNOSIS — R262 Difficulty in walking, not elsewhere classified: Secondary | ICD-10-CM | POA: Diagnosis not present

## 2018-12-27 DIAGNOSIS — M6281 Muscle weakness (generalized): Secondary | ICD-10-CM | POA: Diagnosis not present

## 2018-12-27 DIAGNOSIS — R4189 Other symptoms and signs involving cognitive functions and awareness: Secondary | ICD-10-CM | POA: Diagnosis not present

## 2018-12-27 DIAGNOSIS — Z741 Need for assistance with personal care: Secondary | ICD-10-CM | POA: Diagnosis not present

## 2018-12-27 DIAGNOSIS — R278 Other lack of coordination: Secondary | ICD-10-CM | POA: Diagnosis not present

## 2018-12-29 DIAGNOSIS — R4189 Other symptoms and signs involving cognitive functions and awareness: Secondary | ICD-10-CM | POA: Diagnosis not present

## 2018-12-29 DIAGNOSIS — Z741 Need for assistance with personal care: Secondary | ICD-10-CM | POA: Diagnosis not present

## 2018-12-29 DIAGNOSIS — R278 Other lack of coordination: Secondary | ICD-10-CM | POA: Diagnosis not present

## 2018-12-29 DIAGNOSIS — R262 Difficulty in walking, not elsewhere classified: Secondary | ICD-10-CM | POA: Diagnosis not present

## 2018-12-29 DIAGNOSIS — M6281 Muscle weakness (generalized): Secondary | ICD-10-CM | POA: Diagnosis not present

## 2018-12-29 DIAGNOSIS — Z8673 Personal history of transient ischemic attack (TIA), and cerebral infarction without residual deficits: Secondary | ICD-10-CM | POA: Diagnosis not present

## 2018-12-30 DIAGNOSIS — M6281 Muscle weakness (generalized): Secondary | ICD-10-CM | POA: Diagnosis not present

## 2018-12-30 DIAGNOSIS — R262 Difficulty in walking, not elsewhere classified: Secondary | ICD-10-CM | POA: Diagnosis not present

## 2018-12-30 DIAGNOSIS — Z8673 Personal history of transient ischemic attack (TIA), and cerebral infarction without residual deficits: Secondary | ICD-10-CM | POA: Diagnosis not present

## 2018-12-30 DIAGNOSIS — R4189 Other symptoms and signs involving cognitive functions and awareness: Secondary | ICD-10-CM | POA: Diagnosis not present

## 2018-12-30 DIAGNOSIS — Z741 Need for assistance with personal care: Secondary | ICD-10-CM | POA: Diagnosis not present

## 2018-12-30 DIAGNOSIS — R278 Other lack of coordination: Secondary | ICD-10-CM | POA: Diagnosis not present

## 2019-01-02 DIAGNOSIS — R4189 Other symptoms and signs involving cognitive functions and awareness: Secondary | ICD-10-CM | POA: Diagnosis not present

## 2019-01-02 DIAGNOSIS — Z741 Need for assistance with personal care: Secondary | ICD-10-CM | POA: Diagnosis not present

## 2019-01-02 DIAGNOSIS — R278 Other lack of coordination: Secondary | ICD-10-CM | POA: Diagnosis not present

## 2019-01-02 DIAGNOSIS — R262 Difficulty in walking, not elsewhere classified: Secondary | ICD-10-CM | POA: Diagnosis not present

## 2019-01-02 DIAGNOSIS — Z8673 Personal history of transient ischemic attack (TIA), and cerebral infarction without residual deficits: Secondary | ICD-10-CM | POA: Diagnosis not present

## 2019-01-02 DIAGNOSIS — M6281 Muscle weakness (generalized): Secondary | ICD-10-CM | POA: Diagnosis not present

## 2019-01-03 DIAGNOSIS — R4189 Other symptoms and signs involving cognitive functions and awareness: Secondary | ICD-10-CM | POA: Diagnosis not present

## 2019-01-03 DIAGNOSIS — R262 Difficulty in walking, not elsewhere classified: Secondary | ICD-10-CM | POA: Diagnosis not present

## 2019-01-03 DIAGNOSIS — M6281 Muscle weakness (generalized): Secondary | ICD-10-CM | POA: Diagnosis not present

## 2019-01-03 DIAGNOSIS — Z741 Need for assistance with personal care: Secondary | ICD-10-CM | POA: Diagnosis not present

## 2019-01-03 DIAGNOSIS — R278 Other lack of coordination: Secondary | ICD-10-CM | POA: Diagnosis not present

## 2019-01-03 DIAGNOSIS — Z8673 Personal history of transient ischemic attack (TIA), and cerebral infarction without residual deficits: Secondary | ICD-10-CM | POA: Diagnosis not present

## 2019-01-04 DIAGNOSIS — Z8673 Personal history of transient ischemic attack (TIA), and cerebral infarction without residual deficits: Secondary | ICD-10-CM | POA: Diagnosis not present

## 2019-01-04 DIAGNOSIS — R4189 Other symptoms and signs involving cognitive functions and awareness: Secondary | ICD-10-CM | POA: Diagnosis not present

## 2019-01-04 DIAGNOSIS — Z741 Need for assistance with personal care: Secondary | ICD-10-CM | POA: Diagnosis not present

## 2019-01-04 DIAGNOSIS — R262 Difficulty in walking, not elsewhere classified: Secondary | ICD-10-CM | POA: Diagnosis not present

## 2019-01-04 DIAGNOSIS — R278 Other lack of coordination: Secondary | ICD-10-CM | POA: Diagnosis not present

## 2019-01-04 DIAGNOSIS — M6281 Muscle weakness (generalized): Secondary | ICD-10-CM | POA: Diagnosis not present

## 2019-01-05 DIAGNOSIS — Z8673 Personal history of transient ischemic attack (TIA), and cerebral infarction without residual deficits: Secondary | ICD-10-CM | POA: Diagnosis not present

## 2019-01-05 DIAGNOSIS — R4189 Other symptoms and signs involving cognitive functions and awareness: Secondary | ICD-10-CM | POA: Diagnosis not present

## 2019-01-05 DIAGNOSIS — Z741 Need for assistance with personal care: Secondary | ICD-10-CM | POA: Diagnosis not present

## 2019-01-05 DIAGNOSIS — M6281 Muscle weakness (generalized): Secondary | ICD-10-CM | POA: Diagnosis not present

## 2019-01-05 DIAGNOSIS — R262 Difficulty in walking, not elsewhere classified: Secondary | ICD-10-CM | POA: Diagnosis not present

## 2019-01-05 DIAGNOSIS — R278 Other lack of coordination: Secondary | ICD-10-CM | POA: Diagnosis not present

## 2019-01-06 DIAGNOSIS — Z741 Need for assistance with personal care: Secondary | ICD-10-CM | POA: Diagnosis not present

## 2019-01-06 DIAGNOSIS — R4189 Other symptoms and signs involving cognitive functions and awareness: Secondary | ICD-10-CM | POA: Diagnosis not present

## 2019-01-06 DIAGNOSIS — R262 Difficulty in walking, not elsewhere classified: Secondary | ICD-10-CM | POA: Diagnosis not present

## 2019-01-06 DIAGNOSIS — Z8673 Personal history of transient ischemic attack (TIA), and cerebral infarction without residual deficits: Secondary | ICD-10-CM | POA: Diagnosis not present

## 2019-01-06 DIAGNOSIS — R278 Other lack of coordination: Secondary | ICD-10-CM | POA: Diagnosis not present

## 2019-01-06 DIAGNOSIS — M6281 Muscle weakness (generalized): Secondary | ICD-10-CM | POA: Diagnosis not present

## 2019-01-09 DIAGNOSIS — Z8673 Personal history of transient ischemic attack (TIA), and cerebral infarction without residual deficits: Secondary | ICD-10-CM | POA: Diagnosis not present

## 2019-01-09 DIAGNOSIS — R4189 Other symptoms and signs involving cognitive functions and awareness: Secondary | ICD-10-CM | POA: Diagnosis not present

## 2019-01-09 DIAGNOSIS — M6281 Muscle weakness (generalized): Secondary | ICD-10-CM | POA: Diagnosis not present

## 2019-01-09 DIAGNOSIS — R278 Other lack of coordination: Secondary | ICD-10-CM | POA: Diagnosis not present

## 2019-01-09 DIAGNOSIS — R262 Difficulty in walking, not elsewhere classified: Secondary | ICD-10-CM | POA: Diagnosis not present

## 2019-01-09 DIAGNOSIS — Z741 Need for assistance with personal care: Secondary | ICD-10-CM | POA: Diagnosis not present

## 2019-01-10 DIAGNOSIS — R262 Difficulty in walking, not elsewhere classified: Secondary | ICD-10-CM | POA: Diagnosis not present

## 2019-01-10 DIAGNOSIS — R4189 Other symptoms and signs involving cognitive functions and awareness: Secondary | ICD-10-CM | POA: Diagnosis not present

## 2019-01-10 DIAGNOSIS — Z741 Need for assistance with personal care: Secondary | ICD-10-CM | POA: Diagnosis not present

## 2019-01-10 DIAGNOSIS — R278 Other lack of coordination: Secondary | ICD-10-CM | POA: Diagnosis not present

## 2019-01-10 DIAGNOSIS — Z8673 Personal history of transient ischemic attack (TIA), and cerebral infarction without residual deficits: Secondary | ICD-10-CM | POA: Diagnosis not present

## 2019-01-10 DIAGNOSIS — M6281 Muscle weakness (generalized): Secondary | ICD-10-CM | POA: Diagnosis not present

## 2019-01-11 DIAGNOSIS — Z8673 Personal history of transient ischemic attack (TIA), and cerebral infarction without residual deficits: Secondary | ICD-10-CM | POA: Diagnosis not present

## 2019-01-11 DIAGNOSIS — R278 Other lack of coordination: Secondary | ICD-10-CM | POA: Diagnosis not present

## 2019-01-11 DIAGNOSIS — M6281 Muscle weakness (generalized): Secondary | ICD-10-CM | POA: Diagnosis not present

## 2019-01-11 DIAGNOSIS — R262 Difficulty in walking, not elsewhere classified: Secondary | ICD-10-CM | POA: Diagnosis not present

## 2019-01-11 DIAGNOSIS — Z741 Need for assistance with personal care: Secondary | ICD-10-CM | POA: Diagnosis not present

## 2019-01-11 DIAGNOSIS — R4189 Other symptoms and signs involving cognitive functions and awareness: Secondary | ICD-10-CM | POA: Diagnosis not present

## 2019-01-12 DIAGNOSIS — Z8673 Personal history of transient ischemic attack (TIA), and cerebral infarction without residual deficits: Secondary | ICD-10-CM | POA: Diagnosis not present

## 2019-01-12 DIAGNOSIS — R4189 Other symptoms and signs involving cognitive functions and awareness: Secondary | ICD-10-CM | POA: Diagnosis not present

## 2019-01-12 DIAGNOSIS — R262 Difficulty in walking, not elsewhere classified: Secondary | ICD-10-CM | POA: Diagnosis not present

## 2019-01-12 DIAGNOSIS — M6281 Muscle weakness (generalized): Secondary | ICD-10-CM | POA: Diagnosis not present

## 2019-01-12 DIAGNOSIS — R278 Other lack of coordination: Secondary | ICD-10-CM | POA: Diagnosis not present

## 2019-01-12 DIAGNOSIS — Z741 Need for assistance with personal care: Secondary | ICD-10-CM | POA: Diagnosis not present

## 2019-01-13 DIAGNOSIS — R278 Other lack of coordination: Secondary | ICD-10-CM | POA: Diagnosis not present

## 2019-01-13 DIAGNOSIS — Z8673 Personal history of transient ischemic attack (TIA), and cerebral infarction without residual deficits: Secondary | ICD-10-CM | POA: Diagnosis not present

## 2019-01-13 DIAGNOSIS — R262 Difficulty in walking, not elsewhere classified: Secondary | ICD-10-CM | POA: Diagnosis not present

## 2019-01-13 DIAGNOSIS — R4189 Other symptoms and signs involving cognitive functions and awareness: Secondary | ICD-10-CM | POA: Diagnosis not present

## 2019-01-13 DIAGNOSIS — Z741 Need for assistance with personal care: Secondary | ICD-10-CM | POA: Diagnosis not present

## 2019-01-13 DIAGNOSIS — M6281 Muscle weakness (generalized): Secondary | ICD-10-CM | POA: Diagnosis not present

## 2019-01-16 DIAGNOSIS — M6281 Muscle weakness (generalized): Secondary | ICD-10-CM | POA: Diagnosis not present

## 2019-01-16 DIAGNOSIS — R278 Other lack of coordination: Secondary | ICD-10-CM | POA: Diagnosis not present

## 2019-01-16 DIAGNOSIS — R4189 Other symptoms and signs involving cognitive functions and awareness: Secondary | ICD-10-CM | POA: Diagnosis not present

## 2019-01-16 DIAGNOSIS — R262 Difficulty in walking, not elsewhere classified: Secondary | ICD-10-CM | POA: Diagnosis not present

## 2019-01-16 DIAGNOSIS — F0150 Vascular dementia without behavioral disturbance: Secondary | ICD-10-CM | POA: Diagnosis not present

## 2019-01-16 DIAGNOSIS — Z741 Need for assistance with personal care: Secondary | ICD-10-CM | POA: Diagnosis not present

## 2019-01-17 DIAGNOSIS — R278 Other lack of coordination: Secondary | ICD-10-CM | POA: Diagnosis not present

## 2019-01-17 DIAGNOSIS — Z741 Need for assistance with personal care: Secondary | ICD-10-CM | POA: Diagnosis not present

## 2019-01-17 DIAGNOSIS — R262 Difficulty in walking, not elsewhere classified: Secondary | ICD-10-CM | POA: Diagnosis not present

## 2019-01-17 DIAGNOSIS — R4189 Other symptoms and signs involving cognitive functions and awareness: Secondary | ICD-10-CM | POA: Diagnosis not present

## 2019-01-17 DIAGNOSIS — F0150 Vascular dementia without behavioral disturbance: Secondary | ICD-10-CM | POA: Diagnosis not present

## 2019-01-17 DIAGNOSIS — M6281 Muscle weakness (generalized): Secondary | ICD-10-CM | POA: Diagnosis not present

## 2019-01-18 DIAGNOSIS — R262 Difficulty in walking, not elsewhere classified: Secondary | ICD-10-CM | POA: Diagnosis not present

## 2019-01-18 DIAGNOSIS — R278 Other lack of coordination: Secondary | ICD-10-CM | POA: Diagnosis not present

## 2019-01-18 DIAGNOSIS — Z741 Need for assistance with personal care: Secondary | ICD-10-CM | POA: Diagnosis not present

## 2019-01-18 DIAGNOSIS — F0150 Vascular dementia without behavioral disturbance: Secondary | ICD-10-CM | POA: Diagnosis not present

## 2019-01-18 DIAGNOSIS — R4189 Other symptoms and signs involving cognitive functions and awareness: Secondary | ICD-10-CM | POA: Diagnosis not present

## 2019-01-18 DIAGNOSIS — M6281 Muscle weakness (generalized): Secondary | ICD-10-CM | POA: Diagnosis not present

## 2019-01-19 DIAGNOSIS — M6281 Muscle weakness (generalized): Secondary | ICD-10-CM | POA: Diagnosis not present

## 2019-01-19 DIAGNOSIS — R4189 Other symptoms and signs involving cognitive functions and awareness: Secondary | ICD-10-CM | POA: Diagnosis not present

## 2019-01-19 DIAGNOSIS — R262 Difficulty in walking, not elsewhere classified: Secondary | ICD-10-CM | POA: Diagnosis not present

## 2019-01-19 DIAGNOSIS — Z741 Need for assistance with personal care: Secondary | ICD-10-CM | POA: Diagnosis not present

## 2019-01-19 DIAGNOSIS — F0150 Vascular dementia without behavioral disturbance: Secondary | ICD-10-CM | POA: Diagnosis not present

## 2019-01-19 DIAGNOSIS — R278 Other lack of coordination: Secondary | ICD-10-CM | POA: Diagnosis not present

## 2019-01-20 DIAGNOSIS — R4189 Other symptoms and signs involving cognitive functions and awareness: Secondary | ICD-10-CM | POA: Diagnosis not present

## 2019-01-20 DIAGNOSIS — R278 Other lack of coordination: Secondary | ICD-10-CM | POA: Diagnosis not present

## 2019-01-20 DIAGNOSIS — M6281 Muscle weakness (generalized): Secondary | ICD-10-CM | POA: Diagnosis not present

## 2019-01-20 DIAGNOSIS — F0150 Vascular dementia without behavioral disturbance: Secondary | ICD-10-CM | POA: Diagnosis not present

## 2019-01-20 DIAGNOSIS — Z741 Need for assistance with personal care: Secondary | ICD-10-CM | POA: Diagnosis not present

## 2019-01-20 DIAGNOSIS — R262 Difficulty in walking, not elsewhere classified: Secondary | ICD-10-CM | POA: Diagnosis not present

## 2019-01-24 DIAGNOSIS — Z741 Need for assistance with personal care: Secondary | ICD-10-CM | POA: Diagnosis not present

## 2019-01-24 DIAGNOSIS — R278 Other lack of coordination: Secondary | ICD-10-CM | POA: Diagnosis not present

## 2019-01-24 DIAGNOSIS — R262 Difficulty in walking, not elsewhere classified: Secondary | ICD-10-CM | POA: Diagnosis not present

## 2019-01-24 DIAGNOSIS — R4189 Other symptoms and signs involving cognitive functions and awareness: Secondary | ICD-10-CM | POA: Diagnosis not present

## 2019-01-24 DIAGNOSIS — F0150 Vascular dementia without behavioral disturbance: Secondary | ICD-10-CM | POA: Diagnosis not present

## 2019-01-24 DIAGNOSIS — M6281 Muscle weakness (generalized): Secondary | ICD-10-CM | POA: Diagnosis not present

## 2019-01-26 DIAGNOSIS — M6281 Muscle weakness (generalized): Secondary | ICD-10-CM | POA: Diagnosis not present

## 2019-01-26 DIAGNOSIS — F0150 Vascular dementia without behavioral disturbance: Secondary | ICD-10-CM | POA: Diagnosis not present

## 2019-01-26 DIAGNOSIS — Z741 Need for assistance with personal care: Secondary | ICD-10-CM | POA: Diagnosis not present

## 2019-01-26 DIAGNOSIS — R262 Difficulty in walking, not elsewhere classified: Secondary | ICD-10-CM | POA: Diagnosis not present

## 2019-01-26 DIAGNOSIS — R4189 Other symptoms and signs involving cognitive functions and awareness: Secondary | ICD-10-CM | POA: Diagnosis not present

## 2019-01-26 DIAGNOSIS — R278 Other lack of coordination: Secondary | ICD-10-CM | POA: Diagnosis not present

## 2019-01-27 DIAGNOSIS — R4189 Other symptoms and signs involving cognitive functions and awareness: Secondary | ICD-10-CM | POA: Diagnosis not present

## 2019-01-27 DIAGNOSIS — R262 Difficulty in walking, not elsewhere classified: Secondary | ICD-10-CM | POA: Diagnosis not present

## 2019-01-27 DIAGNOSIS — Z741 Need for assistance with personal care: Secondary | ICD-10-CM | POA: Diagnosis not present

## 2019-01-27 DIAGNOSIS — F0150 Vascular dementia without behavioral disturbance: Secondary | ICD-10-CM | POA: Diagnosis not present

## 2019-01-27 DIAGNOSIS — M6281 Muscle weakness (generalized): Secondary | ICD-10-CM | POA: Diagnosis not present

## 2019-01-27 DIAGNOSIS — R278 Other lack of coordination: Secondary | ICD-10-CM | POA: Diagnosis not present

## 2019-02-01 DIAGNOSIS — Z741 Need for assistance with personal care: Secondary | ICD-10-CM | POA: Diagnosis not present

## 2019-02-01 DIAGNOSIS — F0150 Vascular dementia without behavioral disturbance: Secondary | ICD-10-CM | POA: Diagnosis not present

## 2019-02-01 DIAGNOSIS — R4189 Other symptoms and signs involving cognitive functions and awareness: Secondary | ICD-10-CM | POA: Diagnosis not present

## 2019-02-01 DIAGNOSIS — R262 Difficulty in walking, not elsewhere classified: Secondary | ICD-10-CM | POA: Diagnosis not present

## 2019-02-01 DIAGNOSIS — R278 Other lack of coordination: Secondary | ICD-10-CM | POA: Diagnosis not present

## 2019-02-01 DIAGNOSIS — M6281 Muscle weakness (generalized): Secondary | ICD-10-CM | POA: Diagnosis not present

## 2019-02-06 DIAGNOSIS — R419 Unspecified symptoms and signs involving cognitive functions and awareness: Secondary | ICD-10-CM | POA: Diagnosis not present

## 2019-02-08 ENCOUNTER — Ambulatory Visit: Payer: Medicare Other | Admitting: Internal Medicine

## 2019-02-08 VITALS — BP 129/64 | HR 65 | Temp 97.4°F | Resp 18 | Wt 143.8 lb

## 2019-02-08 DIAGNOSIS — R21 Rash and other nonspecific skin eruption: Secondary | ICD-10-CM | POA: Diagnosis not present

## 2019-02-08 DIAGNOSIS — R410 Disorientation, unspecified: Secondary | ICD-10-CM

## 2019-02-08 DIAGNOSIS — R269 Unspecified abnormalities of gait and mobility: Secondary | ICD-10-CM | POA: Diagnosis not present

## 2019-02-11 ENCOUNTER — Encounter: Payer: Self-pay | Admitting: Internal Medicine

## 2019-02-11 NOTE — Progress Notes (Signed)
Subjective:    Patient ID: Shelly Padilla, female    DOB: 11/02/1920, 85 y.o.   MRN: [id number]  HPI  Asked to see resident in [address].  RN reports intermittent confusion, walking into things with her walker. RN also reports blisters to right side of back. Noticed 2-3 days ago. Resident reports she does feel confused at times. She sleeps well. Appetite is fair. She does not feel weak, but she reports her balance is not good. She reports her mood can "be down" at times, but no overwhelming feelings of sadness, anxiety She reports the rash does not itch or burn. She did not even know it was there RN had been using Hydrocortisone 1% cream  No recent med changes, diet changes  Review of Systems      Past Medical History:  Diagnosis Date  . Atrial fibrillation ([place])   . Brain tumor ([place])    meningioma  . Glaucoma   . H/O paroxysmal supraventricular tachycardia    documented by Holter Monitor  . Hemorrhoids   . Hiatal hernia   . History of ovarian cyst   . HTN (hypertension)   . Insomnia   . Palpitations   . Thrombocytosis ([place])     Current Outpatient Medications  Medication Sig Dispense Refill  . aspirin EC 81 MG EC tablet Take 1 tablet (81 mg total) by mouth daily.    Marland Kitchen atorvastatin (LIPITOR) 10 MG tablet Take 1 tablet (10 mg total) by mouth daily. 30 tablet 0  . famotidine (PEPCID) 20 MG tablet Take 1 tablet (20 mg total) by mouth 2 (two) times daily. (Patient taking differently: Take 20 mg by mouth 2 (two) times daily. (0700 & 1800)) 60 tablet 0  . hydrochlorothiazide (HYDRODIURIL) 25 MG tablet Take 25 mg by mouth daily. Take 25 mg by mouth in the morning if pedal edema is noted    . Melatonin 5 MG CAPS Take 5 mg by mouth at bedtime as needed. (2100)    . Multiple Vitamins-Minerals (CENTRUM SILVER PO) Take 1 tablet by mouth daily. (1800)    . polyethylene glycol (MIRALAX / GLYCOLAX) packet Take 17 g by mouth daily. (0700)    . ramipril (ALTACE) 10 MG capsule TAKE ONE  CAPSULE BY MOUTH ONCE DAILY (Patient taking differently: Take 10 mg by mouth daily. (0700)) 90 capsule 3  . sennosides-docusate sodium (SENOKOT-S) 8.6-50 MG tablet Take 2 tablets by mouth daily.    . timolol (TIMOPTIC) 0.5 % ophthalmic solution Place 1 drop into both eyes daily. (1630)    . acetaminophen (TYLENOL) 325 MG tablet Take 325-650 mg by mouth every 4 (four) hours as needed for mild pain or fever.     Marland Kitchen alum & mag hydroxide-simeth (MAALOX/MYLANTA) 200-200-20 MG/5ML suspension Take 30 mLs by mouth every 4 (four) hours as needed for indigestion or heartburn.    . bismuth subsalicylate (KAOPECTATE) 262 MG/15ML suspension Take 10 mLs by mouth every 6 (six) hours as needed.    . carbamide peroxide (DEBROX) 6.5 % OTIC solution Place 5 drops into both ears daily.    Marland Kitchen dextromethorphan-guaiFENesin (TUSSIN DM) 10-100 MG/5ML liquid Take 10 mLs by mouth every 4 (four) hours as needed for cough.    . diphenhydrAMINE (BENADRYL) 25 mg capsule Take 25 mg by mouth every 4 (four) hours as needed for itching or allergies.    . magnesium hydroxide (MILK OF MAGNESIA) 400 MG/5ML suspension Take 30 mLs by mouth daily as needed for mild constipation.    Marland Kitchen  metoprolol succinate (TOPROL-XL) 50 MG 24 hr tablet Take 1 tablet (50 mg total) by mouth daily with supper. Take with or immediately following a meal. (Patient taking differently: Take 50 mg by mouth daily with supper. Take with or immediately following a meal. (1800)) 30 tablet 6  . nystatin (MYCOSTATIN/NYSTOP) powder Apply 1 Bottle topically 2 (two) times daily. Under breast and to groin/abdominal fold areas as needed for rash    . ondansetron (ZOFRAN) 4 MG tablet Take 4 mg by mouth 3 (three) times daily as needed for nausea.    . sertraline (ZOLOFT) 25 MG tablet Take 25 mg by mouth daily. Take one tablet by mouth every day for 1 week and then every other day for 1 week and then stop. (0700)     No current facility-administered medications for this visit.      Allergies  Allergen Reactions  . Amoxil [Amoxicillin] Anxiety  . Codeine   . Pacerone  [Amiodarone Hcl] Nausea And Vomiting  . Sulfa Drugs Cross Reactors   . Amlodipine Other (See Comments)    Mild edema    Family History  Problem Relation Age of Onset  . Heart attack Father   . Heart failure Brother     Social History   Socioeconomic History  . Marital status: Widowed    Spouse name: Not on file  . Number of children: 1  . Years of education: Not on file  . Highest education level: Not on file  Occupational History  . Occupation: retired    Comment: Transport planner  . Financial resource strain: Not on file  . Food insecurity:    Worry: Not on file    Inability: Not on file  . Transportation needs:    Medical: Not on file    Non-medical: Not on file  Tobacco Use  . Smoking status: Never Smoker  . Smokeless tobacco: Never Used  Substance and Sexual Activity  . Alcohol use: Yes    Alcohol/week: 0.0 standard drinks    Comment: occasionally wine  . Drug use: No  . Sexual activity: Not on file  Lifestyle  . Physical activity:    Days per week: Not on file    Minutes per session: Not on file  . Stress: Not on file  Relationships  . Social connections:    Talks on phone: Not on file    Gets together: Not on file    Attends religious service: Not on file    Active member of club or organization: Not on file    Attends meetings of clubs or organizations: Not on file    Relationship status: Not on file  . Intimate partner violence:    Fear of current or ex partner: Not on file    Emotionally abused: Not on file    Physically abused: Not on file    Forced sexual activity: Not on file  Other Topics Concern  . Not on file  Social History Narrative   Primary care giver of husband with Alzheimer's--now in memory care      Has living will   Son is health care POA   Has DNR   No tube feeds if cognitively unaware     Constitutional: Denies fever,  malaise, fatigue, headache or abrupt weight changes.  HEENT: Denies eye pain, eye redness, ear pain, ringing in the ears, wax buildup, runny nose, nasal congestion, bloody nose, or sore throat. Respiratory: Denies difficulty breathing, shortness of breath, cough or sputum  production.   Cardiovascular: Denies chest pain, chest tightness, palpitations or swelling in the hands or feet.  Gastrointestinal: Denies abdominal pain, bloating, constipation, diarrhea or blood in the stool.  GU: Denies urgency, frequency, pain with urination, burning sensation, blood in urine, odor or discharge. Musculoskeletal: Pt reports weakness, difficulty with gait. Denies decrease in range of motion, muscle pain or joint pain and swelling.  Skin: Pt reports blisters of back. Denies ulcercations.  Neurological: Pt reports intermittent confusion, difficulty with balance. Denies dizziness, difficulty with memory, difficulty with speech or problems with coordination.  Psych: Denies anxiety, depression, SI/HI.  No other specific complaints in a complete review of systems (except as listed in HPI above).  Objective:   Physical Exam   BP 129/64   Pulse 65   Temp (!) 97.4 F (36.3 C)   Resp 18   Wt 143 lb 12.8 oz (65.2 kg)   BMI 21.86 kg/m  Wt Readings from Last 3 Encounters:  02/11/19 143 lb 12.8 oz (65.2 kg)  11/27/18 137 lb 9.6 oz (62.4 kg)  11/26/18 140 lb (63.5 kg)    General: Appears her stated age, well developed, well nourished in NAD. Skin: 2 large fluid filled blisters to right flank area. She has a papular rash of the lower back that does cross midline. Not classic shingles. No vesicles or scabbed lesions. Cardiovascular: Normal rate and rhythm. S1,S2 noted.  No murmur, rubs or gallops noted.  Pulmonary/Chest: Normal effort and positive vesicular breath sounds. No respiratory distress. No wheezes, rales or ronchi noted.  Abdomen: Soft and nontender. Normal bowel sounds. No distention or masses noted.  Liver, spleen and kidneys non palpable. Musculoskeletal: Gait slow with use of rolling walker. Neurological: Alert and oriented to person and place. Psychiatric: Mood and affect normal.   BMET    Component Value Date/Time   NA 135 11/29/2018 0548   NA 139 01/08/2016 1210   NA 141 08/03/2014 0455   K 4.1 11/29/2018 0548   K 3.9 08/03/2014 0455   CL 103 11/29/2018 0548   CL 103 08/03/2014 0455   CO2 24 11/29/2018 0548   CO2 31 08/03/2014 0455   GLUCOSE 101 (H) 11/29/2018 0548   GLUCOSE 80 08/03/2014 0455   BUN 16 11/29/2018 0548   BUN 17 01/08/2016 1210   BUN 12 08/03/2014 0455   CREATININE 0.44 11/29/2018 0548   CREATININE 0.57 (L) 08/03/2014 0455   CALCIUM 8.5 (L) 11/29/2018 0548   CALCIUM 8.0 (L) 08/03/2014 0455   GFRNONAA >60 11/29/2018 0548   GFRNONAA >60 08/03/2014 0455   GFRAA >60 11/29/2018 0548   GFRAA >60 08/03/2014 0455    Lipid Panel     Component Value Date/Time   CHOL 153 11/30/2018 0521   CHOL 120 08/03/2014 0455   TRIG 109 11/30/2018 0521   TRIG 64 08/03/2014 0455   HDL 44 11/30/2018 0521   HDL 48 08/03/2014 0455   CHOLHDL 3.5 11/30/2018 0521   VLDL 22 11/30/2018 0521   VLDL 13 08/03/2014 0455   LDLCALC 87 11/30/2018 0521   LDLCALC 59 08/03/2014 0455    CBC    Component Value Date/Time   WBC 11.7 (H) 11/29/2018 0548   RBC 5.14 (H) 11/29/2018 0548   HGB 15.0 11/29/2018 0548   HGB 13.3 08/03/2014 0455   HCT 45.5 11/29/2018 0548   HCT 38.5 08/03/2014 0455   PLT 714 (H) 11/29/2018 0548   PLT 467 (H) 08/03/2014 0455   MCV 88.5 11/29/2018 0548   MCV  90 08/03/2014 0455   MCH 29.2 11/29/2018 0548   MCHC 33.0 11/29/2018 0548   RDW 12.5 11/29/2018 0548   RDW 13.2 08/03/2014 0455   LYMPHSABS 0.9 11/27/2018 1122   LYMPHSABS 1.4 08/03/2014 0455   MONOABS 1.6 (H) 11/27/2018 1122   MONOABS 1.1 (H) 08/03/2014 0455   EOSABS 0.0 11/27/2018 1122   EOSABS 0.1 08/03/2014 0455   BASOSABS 0.1 11/27/2018 1122   BASOSABS 0.1 08/03/2014 0455    Hgb  A1C Lab Results  Component Value Date   HGBA1C 5.5 11/27/2017           Assessment & Plan:   Rash of Back:  No clearly shingles Continue Hydrocortisone 1% cream BID prn until resolved Notify me if appears to be getting worse instead of better.   Confusion:  Urinalysis reviewed Waiting culture results ? Cognitive changes from previous stroke Consider Aricept 10 mg daily pending culture results  Difficulty with Gait:  Pt declines PT referral at this time  Will reassess as needed Webb Silversmith, NP

## 2019-02-11 NOTE — Patient Instructions (Signed)
Confusion °Confusion is the inability to think with the usual speed or clarity. People who are confused often describe their thinking as cloudy or unclear. Confusion can also include feeling disoriented. This means you are unaware of where you are or who you are. You may also not know the date or time. When confused, you may have difficulty remembering, paying attention, or making decisions. Some people also act aggressively when they are confused. °In some cases, confusion may come on quickly. In other cases, it may develop slowly over time. How quickly confusion comes on depends on the cause. °Confusion may be caused by: °· Head injury (concussion). °· Seizures. °· Stroke. °· Fever. °· Brain tumor. °· Decrease in brain function due to a vascular or neurologic condition (dementia). °· Emotions, like rage or terror. °· Inability to know what is real and what is not (hallucinations). °· Infections, such as a urinary tract infection (UTI). °· Using too much alcohol, drugs, or medicines. °· Loss of fluid (dehydration) or an imbalance of salts in the body (electrolytes). °· Lack of sleep. °· Low blood sugar (diabetes). °· Low levels of oxygen. This comes from conditions such as chronic lung disorders. °· Side effects of medicines, or taking medicines that affect other medicines (drug interactions). °· Lack of certain nutrients, especially niacin, thiamine, vitamin C, or vitamin B. °· Sudden drop in body temperature (hypothermia). °· Change in routine, such as traveling or being hospitalized. °Follow these instructions at home: °Pay attention to your symptoms. Tell your health care provider about any changes or if you develop new symptoms. Follow these instructions to control or treat symptoms. Ask a family member or friend for help if needed. °Medicines °· Take over-the-counter and prescription medicines only as told by your health care provider. °· Ask your health care provider about changing or stopping any medicines  that may be causing your confusion. °· Avoid pain medicines or sleep medicines until you have fully recovered. °· Use a pillbox or an alarm to help you take the right medicines at the right time. °Lifestyle ° °· Eat a balanced diet that includes fruits and vegetables. °· Get enough sleep. For most adults, this is 7-9 hours each night. °· Do not drink alcohol. °· Do not become isolated. Spend time with other people and make plans for your days. °· Do not drive until your health care provider says that it is safe to do so. °· Do not use any products that contain nicotine or tobacco, such as cigarettes and e-cigarettes. If you need help quitting, ask your health care provider. °· Stop other activities that may increase your chances of getting hurt. These may include some work duties, sports activities, swimming, or bike riding. Ask your health care provider what activities are safe for you. °What caregivers can do °· Find out if the person is confused. Ask the person to state his or her name, age, and the date. If the person is unsure or answers incorrectly, he or she may be confused. °· Always introduce yourself, no matter how well the person knows you. °· Remind the person of his or her location. Do this often. °· Place a calendar and clock near the person who is confused. °· Talk about current events and plans for the day. °· Keep the environment calm, quiet, and peaceful. °· Help the person do the things that he or she is unable to do. These include: °? Taking medicines. °? Keeping follow-up visits with his or her health care   provider. °? Helping with household duties, including meal preparation. °? Running errands. °· Get help if you need it. There are several support groups for caregivers. °· If the person you are helping needs more support, consider day care, extended care programs, or a skilled nursing facility. The person's health care provider may be able to help evaluate these options. °General  instructions °· Monitor yourself for any conditions you may have. These may include: °? Checking your blood glucose levels, if you have diabetes. °? Watching your weight, if you are overweight. °? Monitoring your blood pressure, if you have hypertension. °? Monitoring your body temperature, if you have a fever. °· Keep all follow-up visits as told by your health care provider. This is important. °Contact a health care provider if: °· Your symptoms get worse. °Get help right away if you: °· Feel that you are not able to care for yourself. °· Develop severe headaches, repeated vomiting, seizures, blackouts, or slurred speech. °· Have increasing confusion, weakness, numbness, restlessness, or personality changes. °· Develop a loss of balance, have marked dizziness, feel uncoordinated, or fall. °· Develop severe anxiety, or you have delusions or hallucinations. °These symptoms may represent a serious problem that is an emergency. Do not wait to see if the symptoms will go away. Get medical help right away. Call your local emergency services (911 in the [place] Do not drive yourself to the hospital. °Summary °· Confusion is the inability to think with the usual speed or clarity. People who are confused often describe their thinking as cloudy or unclear. °· Confusion can also include having difficulty remembering, paying attention, or making decisions. °· Confusion may come on quickly or develop slowly over time, depending on the cause. There are many different causes of confusion. °· Ask for help from family members or friends if you are unable to take care of yourself. °This information is not intended to replace advice given to you by your health care provider. Make sure you discuss any questions you have with your health care provider. °Document Released: 01/07/2005 Document Revised: 12/02/2017 Document Reviewed: 12/02/2017 °Elsevier Interactive Patient Education © 2019 [employer] ° °

## 2019-04-05 ENCOUNTER — Telehealth: Payer: Self-pay

## 2019-04-05 NOTE — Telephone Encounter (Signed)
Called patient from recall.  No answer.  There was no VM option.  Patient is past due for a 12 month follow up.  Need to schedule an Evisit

## 2019-04-20 DIAGNOSIS — I10 Essential (primary) hypertension: Secondary | ICD-10-CM | POA: Diagnosis not present

## 2019-04-20 LAB — BASIC METABOLIC PANEL: Creatinine: 0.5 (ref 0.5–1.1)

## 2019-04-27 ENCOUNTER — Ambulatory Visit: Payer: Medicare Other | Admitting: Internal Medicine

## 2019-04-27 ENCOUNTER — Encounter: Payer: Self-pay | Admitting: Internal Medicine

## 2019-04-27 VITALS — BP 156/82 | HR 52 | Temp 98.1°F | Resp 16 | Wt 143.4 lb

## 2019-04-27 DIAGNOSIS — D329 Benign neoplasm of meninges, unspecified: Secondary | ICD-10-CM | POA: Diagnosis not present

## 2019-04-27 DIAGNOSIS — I471 Supraventricular tachycardia: Secondary | ICD-10-CM | POA: Diagnosis not present

## 2019-04-27 DIAGNOSIS — I10 Essential (primary) hypertension: Secondary | ICD-10-CM | POA: Diagnosis not present

## 2019-04-27 DIAGNOSIS — I6523 Occlusion and stenosis of bilateral carotid arteries: Secondary | ICD-10-CM

## 2019-04-27 DIAGNOSIS — I482 Chronic atrial fibrillation, unspecified: Secondary | ICD-10-CM | POA: Diagnosis not present

## 2019-04-27 DIAGNOSIS — F39 Unspecified mood [affective] disorder: Secondary | ICD-10-CM | POA: Diagnosis not present

## 2019-04-27 DIAGNOSIS — I639 Cerebral infarction, unspecified: Secondary | ICD-10-CM | POA: Diagnosis not present

## 2019-04-27 NOTE — Assessment & Plan Note (Signed)
Only moderate Doubt this is a factor Probably embolic

## 2019-04-27 NOTE — Assessment & Plan Note (Addendum)
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

## 2019-04-27 NOTE — Assessment & Plan Note (Signed)
BP Readings from Last 3 Encounters:  04/27/19 (!) 156/82  02/11/19 129/64  11/30/18 (!) 143/86   Up some lately Will restart HCTZ but at a lower dose

## 2019-04-27 NOTE — Assessment & Plan Note (Signed)
I don't believe this is part of the issue with her neuro status---since cognitive and not focal

## 2019-04-27 NOTE — Assessment & Plan Note (Signed)
Depression is not as evident Off the SSRI now since her dizziness and delirium after hospital and stroke

## 2019-04-27 NOTE — Assessment & Plan Note (Signed)
No apparent paroxysms

## 2019-04-27 NOTE — Assessment & Plan Note (Signed)
Will change to eliquis given her events

## 2019-04-27 NOTE — Progress Notes (Addendum)
Subjective:    Patient ID: Shelly Padilla, female    DOB: 11-12-1920, 85 y.o.   MRN: [id number]  HPI Visit in assisted living room to review her status Had hospitalization for CVA and rehab stay  "I'm sick" Having some abdominal symptoms----stomach cramping Back of neck stiff More confusion today Staff concerned about her meningioma--but unlikely to be causing the neck  Known moderate plaque in carotids--unlikely would cause a new event  Had been eating okay till the past few days No vomiting  Off the sertraline since the stroke Depressed mood doesn't seem as noticeable  Staff has noted some increase in edema and BP since off the HCTZ  Current Outpatient Medications on File Prior to Visit  Medication Sig Dispense Refill  . acetaminophen (TYLENOL) 325 MG tablet Take 325-650 mg by mouth every 4 (four) hours as needed for mild pain or fever.     Marland Kitchen aspirin EC 81 MG EC tablet Take 1 tablet (81 mg total) by mouth daily.    Marland Kitchen atorvastatin (LIPITOR) 10 MG tablet Take 1 tablet (10 mg total) by mouth daily. 30 tablet 0  . dextromethorphan-guaiFENesin (TUSSIN DM) 10-100 MG/5ML liquid Take 10 mLs by mouth every 4 (four) hours as needed for cough.    . famotidine (PEPCID) 20 MG tablet Take 1 tablet (20 mg total) by mouth 2 (two) times daily. (Patient taking differently: Take 20 mg by mouth 2 (two) times daily. (0700 & 1800)) 60 tablet 0  . hydrochlorothiazide (HYDRODIURIL) 25 MG tablet Take 25 mg by mouth daily. Take 25 mg by mouth in the morning if pedal edema is noted    . Melatonin 5 MG CAPS Take 5 mg by mouth at bedtime as needed. (2100)    . metoprolol succinate (TOPROL-XL) 50 MG 24 hr tablet Take 1 tablet (50 mg total) by mouth daily with supper. Take with or immediately following a meal. (Patient taking differently: Take 50 mg by mouth daily with supper. Take with or immediately following a meal. (1800)) 30 tablet 6  . Multiple Vitamins-Minerals (CENTRUM SILVER PO) Take 1 tablet by mouth  daily. (1800)    . ondansetron (ZOFRAN) 4 MG tablet Take 4 mg by mouth 3 (three) times daily as needed for nausea.    . polyethylene glycol (MIRALAX / GLYCOLAX) packet Take 17 g by mouth daily. (0700)    . ramipril (ALTACE) 10 MG capsule TAKE ONE CAPSULE BY MOUTH ONCE DAILY (Patient taking differently: Take 10 mg by mouth daily. (0700)) 90 capsule 3  . sennosides-docusate sodium (SENOKOT-S) 8.6-50 MG tablet Take 2 tablets by mouth daily.    . timolol (TIMOPTIC) 0.5 % ophthalmic solution Place 1 drop into both eyes daily. (1630)     No current facility-administered medications on file prior to visit.     Allergies  Allergen Reactions  . Amoxil [Amoxicillin] Anxiety  . Codeine   . Pacerone  [Amiodarone Hcl] Nausea And Vomiting  . Sulfa Drugs Cross Reactors   . Amlodipine Other (See Comments)    Mild edema    Past Medical History:  Diagnosis Date  . Atrial fibrillation ([place])   . Brain tumor ([place])    meningioma  . Glaucoma   . H/O paroxysmal supraventricular tachycardia    documented by Holter Monitor  . Hemorrhoids   . Hiatal hernia   . History of ovarian cyst   . HTN (hypertension)   . Insomnia   . Palpitations   . Thrombocytosis ([place])     Past  Surgical History:  Procedure Laterality Date  . CATARACT EXTRACTION     left eye  . Leg vein stripping  1970  . TONSILLECTOMY  1929    Family History  Problem Relation Age of Onset  . Heart attack Father   . Heart failure Brother     Social History   Socioeconomic History  . Marital status: Widowed    Spouse name: Not on file  . Number of children: 1  . Years of education: Not on file  . Highest education level: Not on file  Occupational History  . Occupation: retired    Comment: Transport planner  . Financial resource strain: Not on file  . Food insecurity:    Worry: Not on file    Inability: Not on file  . Transportation needs:    Medical: Not on file    Non-medical: Not on file  Tobacco Use  .  Smoking status: Never Smoker  . Smokeless tobacco: Never Used  Substance and Sexual Activity  . Alcohol use: Yes    Alcohol/week: 0.0 standard drinks    Comment: occasionally wine  . Drug use: No  . Sexual activity: Not on file  Lifestyle  . Physical activity:    Days per week: Not on file    Minutes per session: Not on file  . Stress: Not on file  Relationships  . Social connections:    Talks on phone: Not on file    Gets together: Not on file    Attends religious service: Not on file    Active member of club or organization: Not on file    Attends meetings of clubs or organizations: Not on file    Relationship status: Not on file  . Intimate partner violence:    Fear of current or ex partner: Not on file    Emotionally abused: Not on file    Physically abused: Not on file    Forced sexual activity: Not on file  Other Topics Concern  . Not on file  Social History Narrative   Primary care giver of husband with Alzheimer's--now in memory care      Has living will   Son is health care POA   Has DNR   No tube feeds if cognitively unaware   Review of Systems Weight has been stable Usually sleeps okay Hearing aide not working well--hard to communicate Staff note masses under umbilicus---?tender at times Bowels are okay    Objective:   Physical Exam  Constitutional: She appears well-developed. No distress.  HENT:  Cant hear but seems to have some receptive issues even with motions, writing  Neck: No thyromegaly present.  Fair passive ROM  Cardiovascular: Normal rate and normal heart sounds.  No murmur heard. irregular  Respiratory: Effort normal and breath sounds normal. No respiratory distress. She has no wheezes. She has no rales.  GI: Soft. She exhibits no distension. There is no rebound.  Small ventral hernias below umbilicus which are mildly tender but not incarcerated  Musculoskeletal:     Comments: 1-2+ edema  Lymphadenopathy:    She has no cervical  adenopathy.  Neurological:  Clear cognitive changes----won't even read my notes. Confused Receptive aphasia No clear focal weakness Can stand herself but wobbly  Psychiatric:  Mildly anxious--communication problems, etc           Assessment & Plan:

## 2019-04-28 DIAGNOSIS — I639 Cerebral infarction, unspecified: Secondary | ICD-10-CM | POA: Diagnosis not present

## 2019-05-03 DIAGNOSIS — F419 Anxiety disorder, unspecified: Secondary | ICD-10-CM | POA: Diagnosis not present

## 2019-05-03 DIAGNOSIS — H409 Unspecified glaucoma: Secondary | ICD-10-CM | POA: Diagnosis not present

## 2019-05-03 DIAGNOSIS — K219 Gastro-esophageal reflux disease without esophagitis: Secondary | ICD-10-CM | POA: Diagnosis not present

## 2019-05-03 DIAGNOSIS — G4700 Insomnia, unspecified: Secondary | ICD-10-CM | POA: Diagnosis not present

## 2019-05-03 DIAGNOSIS — I10 Essential (primary) hypertension: Secondary | ICD-10-CM | POA: Diagnosis not present

## 2019-05-03 DIAGNOSIS — I69319 Unspecified symptoms and signs involving cognitive functions following cerebral infarction: Secondary | ICD-10-CM | POA: Diagnosis not present

## 2019-05-03 DIAGNOSIS — M6281 Muscle weakness (generalized): Secondary | ICD-10-CM | POA: Diagnosis not present

## 2019-05-03 DIAGNOSIS — Z741 Need for assistance with personal care: Secondary | ICD-10-CM | POA: Diagnosis not present

## 2019-05-03 DIAGNOSIS — M549 Dorsalgia, unspecified: Secondary | ICD-10-CM | POA: Diagnosis not present

## 2019-05-03 DIAGNOSIS — F39 Unspecified mood [affective] disorder: Secondary | ICD-10-CM | POA: Diagnosis not present

## 2019-05-03 DIAGNOSIS — R278 Other lack of coordination: Secondary | ICD-10-CM | POA: Diagnosis not present

## 2019-05-03 DIAGNOSIS — I6930 Unspecified sequelae of cerebral infarction: Secondary | ICD-10-CM | POA: Diagnosis not present

## 2019-05-03 DIAGNOSIS — Z9181 History of falling: Secondary | ICD-10-CM | POA: Diagnosis not present

## 2019-05-03 DIAGNOSIS — R2681 Unsteadiness on feet: Secondary | ICD-10-CM | POA: Diagnosis not present

## 2019-05-03 DIAGNOSIS — D696 Thrombocytopenia, unspecified: Secondary | ICD-10-CM | POA: Diagnosis not present

## 2019-05-03 DIAGNOSIS — I482 Chronic atrial fibrillation, unspecified: Secondary | ICD-10-CM | POA: Diagnosis not present

## 2019-05-03 DIAGNOSIS — K5900 Constipation, unspecified: Secondary | ICD-10-CM | POA: Diagnosis not present

## 2019-05-03 DIAGNOSIS — R262 Difficulty in walking, not elsewhere classified: Secondary | ICD-10-CM | POA: Diagnosis not present

## 2019-05-03 DIAGNOSIS — E7800 Pure hypercholesterolemia, unspecified: Secondary | ICD-10-CM | POA: Diagnosis not present

## 2019-05-03 DIAGNOSIS — E441 Mild protein-calorie malnutrition: Secondary | ICD-10-CM | POA: Diagnosis not present

## 2019-05-03 DIAGNOSIS — F329 Major depressive disorder, single episode, unspecified: Secondary | ICD-10-CM | POA: Diagnosis not present

## 2019-05-03 DIAGNOSIS — F0150 Vascular dementia without behavioral disturbance: Secondary | ICD-10-CM | POA: Diagnosis not present

## 2019-05-03 DIAGNOSIS — I480 Paroxysmal atrial fibrillation: Secondary | ICD-10-CM | POA: Diagnosis not present

## 2019-05-03 NOTE — Telephone Encounter (Signed)
Called patient from recall.  No answer. LMOV. This is the 3rd attempt per recall list.  Will delete recall.

## 2019-05-11 DIAGNOSIS — I10 Essential (primary) hypertension: Secondary | ICD-10-CM

## 2019-05-11 DIAGNOSIS — E441 Mild protein-calorie malnutrition: Secondary | ICD-10-CM

## 2019-05-11 DIAGNOSIS — F0150 Vascular dementia without behavioral disturbance: Secondary | ICD-10-CM

## 2019-05-11 DIAGNOSIS — I480 Paroxysmal atrial fibrillation: Secondary | ICD-10-CM

## 2019-05-11 DIAGNOSIS — F39 Unspecified mood [affective] disorder: Secondary | ICD-10-CM

## 2019-05-25 DIAGNOSIS — I6930 Unspecified sequelae of cerebral infarction: Secondary | ICD-10-CM | POA: Diagnosis not present

## 2019-05-25 DIAGNOSIS — I482 Chronic atrial fibrillation, unspecified: Secondary | ICD-10-CM | POA: Diagnosis not present

## 2019-05-25 DIAGNOSIS — I10 Essential (primary) hypertension: Secondary | ICD-10-CM

## 2019-05-25 DIAGNOSIS — K219 Gastro-esophageal reflux disease without esophagitis: Secondary | ICD-10-CM | POA: Diagnosis not present

## 2019-05-25 DIAGNOSIS — R2681 Unsteadiness on feet: Secondary | ICD-10-CM | POA: Diagnosis not present

## 2019-05-25 DIAGNOSIS — R262 Difficulty in walking, not elsewhere classified: Secondary | ICD-10-CM | POA: Diagnosis not present

## 2019-05-25 DIAGNOSIS — E7800 Pure hypercholesterolemia, unspecified: Secondary | ICD-10-CM | POA: Diagnosis not present

## 2019-05-25 DIAGNOSIS — E441 Mild protein-calorie malnutrition: Secondary | ICD-10-CM

## 2019-05-25 DIAGNOSIS — F39 Unspecified mood [affective] disorder: Secondary | ICD-10-CM | POA: Diagnosis not present

## 2019-05-25 DIAGNOSIS — Z9181 History of falling: Secondary | ICD-10-CM | POA: Diagnosis not present

## 2019-05-25 DIAGNOSIS — F419 Anxiety disorder, unspecified: Secondary | ICD-10-CM | POA: Diagnosis not present

## 2019-05-25 DIAGNOSIS — G4700 Insomnia, unspecified: Secondary | ICD-10-CM | POA: Diagnosis not present

## 2019-05-25 DIAGNOSIS — M6281 Muscle weakness (generalized): Secondary | ICD-10-CM | POA: Diagnosis not present

## 2019-05-25 DIAGNOSIS — H409 Unspecified glaucoma: Secondary | ICD-10-CM | POA: Diagnosis not present

## 2019-05-25 DIAGNOSIS — R278 Other lack of coordination: Secondary | ICD-10-CM | POA: Diagnosis not present

## 2019-05-25 DIAGNOSIS — I480 Paroxysmal atrial fibrillation: Secondary | ICD-10-CM

## 2019-05-25 DIAGNOSIS — M549 Dorsalgia, unspecified: Secondary | ICD-10-CM | POA: Diagnosis not present

## 2019-05-25 DIAGNOSIS — I69319 Unspecified symptoms and signs involving cognitive functions following cerebral infarction: Secondary | ICD-10-CM | POA: Diagnosis not present

## 2019-05-25 DIAGNOSIS — K5900 Constipation, unspecified: Secondary | ICD-10-CM | POA: Diagnosis not present

## 2019-05-25 DIAGNOSIS — F0150 Vascular dementia without behavioral disturbance: Secondary | ICD-10-CM

## 2019-05-25 DIAGNOSIS — F329 Major depressive disorder, single episode, unspecified: Secondary | ICD-10-CM | POA: Diagnosis not present

## 2019-05-25 DIAGNOSIS — Z741 Need for assistance with personal care: Secondary | ICD-10-CM | POA: Diagnosis not present

## 2019-05-25 DIAGNOSIS — D696 Thrombocytopenia, unspecified: Secondary | ICD-10-CM | POA: Diagnosis not present

## 2019-05-26 DIAGNOSIS — R278 Other lack of coordination: Secondary | ICD-10-CM | POA: Diagnosis not present

## 2019-05-26 DIAGNOSIS — I69319 Unspecified symptoms and signs involving cognitive functions following cerebral infarction: Secondary | ICD-10-CM | POA: Diagnosis not present

## 2019-05-26 DIAGNOSIS — K219 Gastro-esophageal reflux disease without esophagitis: Secondary | ICD-10-CM | POA: Diagnosis not present

## 2019-05-26 DIAGNOSIS — Z741 Need for assistance with personal care: Secondary | ICD-10-CM | POA: Diagnosis not present

## 2019-05-26 DIAGNOSIS — R2681 Unsteadiness on feet: Secondary | ICD-10-CM | POA: Diagnosis not present

## 2019-05-26 DIAGNOSIS — I6930 Unspecified sequelae of cerebral infarction: Secondary | ICD-10-CM | POA: Diagnosis not present

## 2019-05-29 DIAGNOSIS — Z741 Need for assistance with personal care: Secondary | ICD-10-CM | POA: Diagnosis not present

## 2019-05-29 DIAGNOSIS — I69319 Unspecified symptoms and signs involving cognitive functions following cerebral infarction: Secondary | ICD-10-CM | POA: Diagnosis not present

## 2019-05-29 DIAGNOSIS — K219 Gastro-esophageal reflux disease without esophagitis: Secondary | ICD-10-CM | POA: Diagnosis not present

## 2019-05-29 DIAGNOSIS — R2681 Unsteadiness on feet: Secondary | ICD-10-CM | POA: Diagnosis not present

## 2019-05-29 DIAGNOSIS — I6930 Unspecified sequelae of cerebral infarction: Secondary | ICD-10-CM | POA: Diagnosis not present

## 2019-05-29 DIAGNOSIS — R278 Other lack of coordination: Secondary | ICD-10-CM | POA: Diagnosis not present

## 2019-05-30 DIAGNOSIS — I6930 Unspecified sequelae of cerebral infarction: Secondary | ICD-10-CM | POA: Diagnosis not present

## 2019-05-30 DIAGNOSIS — R2681 Unsteadiness on feet: Secondary | ICD-10-CM | POA: Diagnosis not present

## 2019-05-30 DIAGNOSIS — I69319 Unspecified symptoms and signs involving cognitive functions following cerebral infarction: Secondary | ICD-10-CM | POA: Diagnosis not present

## 2019-05-30 DIAGNOSIS — Z741 Need for assistance with personal care: Secondary | ICD-10-CM | POA: Diagnosis not present

## 2019-05-30 DIAGNOSIS — K219 Gastro-esophageal reflux disease without esophagitis: Secondary | ICD-10-CM | POA: Diagnosis not present

## 2019-05-30 DIAGNOSIS — R278 Other lack of coordination: Secondary | ICD-10-CM | POA: Diagnosis not present

## 2019-05-31 DIAGNOSIS — R278 Other lack of coordination: Secondary | ICD-10-CM | POA: Diagnosis not present

## 2019-05-31 DIAGNOSIS — R2681 Unsteadiness on feet: Secondary | ICD-10-CM | POA: Diagnosis not present

## 2019-05-31 DIAGNOSIS — I69319 Unspecified symptoms and signs involving cognitive functions following cerebral infarction: Secondary | ICD-10-CM | POA: Diagnosis not present

## 2019-05-31 DIAGNOSIS — K219 Gastro-esophageal reflux disease without esophagitis: Secondary | ICD-10-CM | POA: Diagnosis not present

## 2019-05-31 DIAGNOSIS — I6930 Unspecified sequelae of cerebral infarction: Secondary | ICD-10-CM | POA: Diagnosis not present

## 2019-05-31 DIAGNOSIS — Z741 Need for assistance with personal care: Secondary | ICD-10-CM | POA: Diagnosis not present

## 2019-06-01 DIAGNOSIS — R2681 Unsteadiness on feet: Secondary | ICD-10-CM | POA: Diagnosis not present

## 2019-06-01 DIAGNOSIS — R278 Other lack of coordination: Secondary | ICD-10-CM | POA: Diagnosis not present

## 2019-06-01 DIAGNOSIS — I69319 Unspecified symptoms and signs involving cognitive functions following cerebral infarction: Secondary | ICD-10-CM | POA: Diagnosis not present

## 2019-06-01 DIAGNOSIS — K219 Gastro-esophageal reflux disease without esophagitis: Secondary | ICD-10-CM | POA: Diagnosis not present

## 2019-06-01 DIAGNOSIS — I6930 Unspecified sequelae of cerebral infarction: Secondary | ICD-10-CM | POA: Diagnosis not present

## 2019-06-01 DIAGNOSIS — Z741 Need for assistance with personal care: Secondary | ICD-10-CM | POA: Diagnosis not present

## 2019-06-02 DIAGNOSIS — I6930 Unspecified sequelae of cerebral infarction: Secondary | ICD-10-CM | POA: Diagnosis not present

## 2019-06-02 DIAGNOSIS — K219 Gastro-esophageal reflux disease without esophagitis: Secondary | ICD-10-CM | POA: Diagnosis not present

## 2019-06-02 DIAGNOSIS — R2681 Unsteadiness on feet: Secondary | ICD-10-CM | POA: Diagnosis not present

## 2019-06-02 DIAGNOSIS — I69319 Unspecified symptoms and signs involving cognitive functions following cerebral infarction: Secondary | ICD-10-CM | POA: Diagnosis not present

## 2019-06-02 DIAGNOSIS — Z741 Need for assistance with personal care: Secondary | ICD-10-CM | POA: Diagnosis not present

## 2019-06-02 DIAGNOSIS — R278 Other lack of coordination: Secondary | ICD-10-CM | POA: Diagnosis not present

## 2019-06-05 DIAGNOSIS — R2681 Unsteadiness on feet: Secondary | ICD-10-CM | POA: Diagnosis not present

## 2019-06-05 DIAGNOSIS — R278 Other lack of coordination: Secondary | ICD-10-CM | POA: Diagnosis not present

## 2019-06-05 DIAGNOSIS — Z741 Need for assistance with personal care: Secondary | ICD-10-CM | POA: Diagnosis not present

## 2019-06-05 DIAGNOSIS — K219 Gastro-esophageal reflux disease without esophagitis: Secondary | ICD-10-CM | POA: Diagnosis not present

## 2019-06-05 DIAGNOSIS — I69319 Unspecified symptoms and signs involving cognitive functions following cerebral infarction: Secondary | ICD-10-CM | POA: Diagnosis not present

## 2019-06-05 DIAGNOSIS — I6930 Unspecified sequelae of cerebral infarction: Secondary | ICD-10-CM | POA: Diagnosis not present

## 2019-06-06 DIAGNOSIS — R278 Other lack of coordination: Secondary | ICD-10-CM | POA: Diagnosis not present

## 2019-06-06 DIAGNOSIS — K219 Gastro-esophageal reflux disease without esophagitis: Secondary | ICD-10-CM | POA: Diagnosis not present

## 2019-06-06 DIAGNOSIS — Z741 Need for assistance with personal care: Secondary | ICD-10-CM | POA: Diagnosis not present

## 2019-06-06 DIAGNOSIS — R2681 Unsteadiness on feet: Secondary | ICD-10-CM | POA: Diagnosis not present

## 2019-06-06 DIAGNOSIS — I69319 Unspecified symptoms and signs involving cognitive functions following cerebral infarction: Secondary | ICD-10-CM | POA: Diagnosis not present

## 2019-06-06 DIAGNOSIS — I6930 Unspecified sequelae of cerebral infarction: Secondary | ICD-10-CM | POA: Diagnosis not present

## 2019-06-07 DIAGNOSIS — K219 Gastro-esophageal reflux disease without esophagitis: Secondary | ICD-10-CM | POA: Diagnosis not present

## 2019-06-07 DIAGNOSIS — R2681 Unsteadiness on feet: Secondary | ICD-10-CM | POA: Diagnosis not present

## 2019-06-07 DIAGNOSIS — Z741 Need for assistance with personal care: Secondary | ICD-10-CM | POA: Diagnosis not present

## 2019-06-07 DIAGNOSIS — R278 Other lack of coordination: Secondary | ICD-10-CM | POA: Diagnosis not present

## 2019-06-07 DIAGNOSIS — I69319 Unspecified symptoms and signs involving cognitive functions following cerebral infarction: Secondary | ICD-10-CM | POA: Diagnosis not present

## 2019-06-07 DIAGNOSIS — I6930 Unspecified sequelae of cerebral infarction: Secondary | ICD-10-CM | POA: Diagnosis not present

## 2019-06-08 DIAGNOSIS — I69319 Unspecified symptoms and signs involving cognitive functions following cerebral infarction: Secondary | ICD-10-CM | POA: Diagnosis not present

## 2019-06-08 DIAGNOSIS — R2681 Unsteadiness on feet: Secondary | ICD-10-CM | POA: Diagnosis not present

## 2019-06-08 DIAGNOSIS — R278 Other lack of coordination: Secondary | ICD-10-CM | POA: Diagnosis not present

## 2019-06-08 DIAGNOSIS — I6930 Unspecified sequelae of cerebral infarction: Secondary | ICD-10-CM | POA: Diagnosis not present

## 2019-06-08 DIAGNOSIS — Z741 Need for assistance with personal care: Secondary | ICD-10-CM | POA: Diagnosis not present

## 2019-06-08 DIAGNOSIS — K219 Gastro-esophageal reflux disease without esophagitis: Secondary | ICD-10-CM | POA: Diagnosis not present

## 2019-06-09 DIAGNOSIS — I69319 Unspecified symptoms and signs involving cognitive functions following cerebral infarction: Secondary | ICD-10-CM | POA: Diagnosis not present

## 2019-06-09 DIAGNOSIS — R278 Other lack of coordination: Secondary | ICD-10-CM | POA: Diagnosis not present

## 2019-06-09 DIAGNOSIS — Z741 Need for assistance with personal care: Secondary | ICD-10-CM | POA: Diagnosis not present

## 2019-06-09 DIAGNOSIS — I6930 Unspecified sequelae of cerebral infarction: Secondary | ICD-10-CM | POA: Diagnosis not present

## 2019-06-09 DIAGNOSIS — K219 Gastro-esophageal reflux disease without esophagitis: Secondary | ICD-10-CM | POA: Diagnosis not present

## 2019-06-09 DIAGNOSIS — R2681 Unsteadiness on feet: Secondary | ICD-10-CM | POA: Diagnosis not present

## 2019-06-12 DIAGNOSIS — R2681 Unsteadiness on feet: Secondary | ICD-10-CM | POA: Diagnosis not present

## 2019-06-12 DIAGNOSIS — K219 Gastro-esophageal reflux disease without esophagitis: Secondary | ICD-10-CM | POA: Diagnosis not present

## 2019-06-12 DIAGNOSIS — I69319 Unspecified symptoms and signs involving cognitive functions following cerebral infarction: Secondary | ICD-10-CM | POA: Diagnosis not present

## 2019-06-12 DIAGNOSIS — I6930 Unspecified sequelae of cerebral infarction: Secondary | ICD-10-CM | POA: Diagnosis not present

## 2019-06-12 DIAGNOSIS — Z741 Need for assistance with personal care: Secondary | ICD-10-CM | POA: Diagnosis not present

## 2019-06-12 DIAGNOSIS — R278 Other lack of coordination: Secondary | ICD-10-CM | POA: Diagnosis not present

## 2019-06-13 DIAGNOSIS — R278 Other lack of coordination: Secondary | ICD-10-CM | POA: Diagnosis not present

## 2019-06-13 DIAGNOSIS — I6930 Unspecified sequelae of cerebral infarction: Secondary | ICD-10-CM | POA: Diagnosis not present

## 2019-06-13 DIAGNOSIS — I69319 Unspecified symptoms and signs involving cognitive functions following cerebral infarction: Secondary | ICD-10-CM | POA: Diagnosis not present

## 2019-06-13 DIAGNOSIS — K219 Gastro-esophageal reflux disease without esophagitis: Secondary | ICD-10-CM | POA: Diagnosis not present

## 2019-06-13 DIAGNOSIS — Z741 Need for assistance with personal care: Secondary | ICD-10-CM | POA: Diagnosis not present

## 2019-06-13 DIAGNOSIS — R2681 Unsteadiness on feet: Secondary | ICD-10-CM | POA: Diagnosis not present

## 2019-06-14 DIAGNOSIS — I6930 Unspecified sequelae of cerebral infarction: Secondary | ICD-10-CM | POA: Diagnosis not present

## 2019-06-14 DIAGNOSIS — K5900 Constipation, unspecified: Secondary | ICD-10-CM | POA: Diagnosis not present

## 2019-06-14 DIAGNOSIS — F39 Unspecified mood [affective] disorder: Secondary | ICD-10-CM | POA: Diagnosis not present

## 2019-06-14 DIAGNOSIS — I10 Essential (primary) hypertension: Secondary | ICD-10-CM | POA: Diagnosis not present

## 2019-06-14 DIAGNOSIS — K219 Gastro-esophageal reflux disease without esophagitis: Secondary | ICD-10-CM | POA: Diagnosis not present

## 2019-06-14 DIAGNOSIS — E441 Mild protein-calorie malnutrition: Secondary | ICD-10-CM | POA: Diagnosis not present

## 2019-06-14 DIAGNOSIS — F0150 Vascular dementia without behavioral disturbance: Secondary | ICD-10-CM | POA: Diagnosis not present

## 2019-06-14 DIAGNOSIS — I69319 Unspecified symptoms and signs involving cognitive functions following cerebral infarction: Secondary | ICD-10-CM | POA: Diagnosis not present

## 2019-06-14 DIAGNOSIS — D696 Thrombocytopenia, unspecified: Secondary | ICD-10-CM | POA: Diagnosis not present

## 2019-06-14 DIAGNOSIS — H409 Unspecified glaucoma: Secondary | ICD-10-CM | POA: Diagnosis not present

## 2019-06-14 DIAGNOSIS — M6281 Muscle weakness (generalized): Secondary | ICD-10-CM | POA: Diagnosis not present

## 2019-06-14 DIAGNOSIS — M549 Dorsalgia, unspecified: Secondary | ICD-10-CM | POA: Diagnosis not present

## 2019-06-14 DIAGNOSIS — G4700 Insomnia, unspecified: Secondary | ICD-10-CM | POA: Diagnosis not present

## 2019-06-14 DIAGNOSIS — Z9181 History of falling: Secondary | ICD-10-CM | POA: Diagnosis not present

## 2019-06-14 DIAGNOSIS — R2681 Unsteadiness on feet: Secondary | ICD-10-CM | POA: Diagnosis not present

## 2019-06-14 DIAGNOSIS — Z741 Need for assistance with personal care: Secondary | ICD-10-CM | POA: Diagnosis not present

## 2019-06-14 DIAGNOSIS — R262 Difficulty in walking, not elsewhere classified: Secondary | ICD-10-CM | POA: Diagnosis not present

## 2019-06-14 DIAGNOSIS — I482 Chronic atrial fibrillation, unspecified: Secondary | ICD-10-CM | POA: Diagnosis not present

## 2019-06-14 DIAGNOSIS — E7800 Pure hypercholesterolemia, unspecified: Secondary | ICD-10-CM | POA: Diagnosis not present

## 2019-06-14 DIAGNOSIS — F329 Major depressive disorder, single episode, unspecified: Secondary | ICD-10-CM | POA: Diagnosis not present

## 2019-06-14 DIAGNOSIS — F419 Anxiety disorder, unspecified: Secondary | ICD-10-CM | POA: Diagnosis not present

## 2019-06-14 DIAGNOSIS — R278 Other lack of coordination: Secondary | ICD-10-CM | POA: Diagnosis not present

## 2019-06-15 DIAGNOSIS — I69319 Unspecified symptoms and signs involving cognitive functions following cerebral infarction: Secondary | ICD-10-CM | POA: Diagnosis not present

## 2019-06-15 DIAGNOSIS — Z741 Need for assistance with personal care: Secondary | ICD-10-CM | POA: Diagnosis not present

## 2019-06-15 DIAGNOSIS — R278 Other lack of coordination: Secondary | ICD-10-CM | POA: Diagnosis not present

## 2019-06-15 DIAGNOSIS — R2681 Unsteadiness on feet: Secondary | ICD-10-CM | POA: Diagnosis not present

## 2019-06-15 DIAGNOSIS — K219 Gastro-esophageal reflux disease without esophagitis: Secondary | ICD-10-CM | POA: Diagnosis not present

## 2019-06-15 DIAGNOSIS — I6930 Unspecified sequelae of cerebral infarction: Secondary | ICD-10-CM | POA: Diagnosis not present

## 2019-06-16 DIAGNOSIS — I69319 Unspecified symptoms and signs involving cognitive functions following cerebral infarction: Secondary | ICD-10-CM | POA: Diagnosis not present

## 2019-06-16 DIAGNOSIS — Z741 Need for assistance with personal care: Secondary | ICD-10-CM | POA: Diagnosis not present

## 2019-06-16 DIAGNOSIS — I6930 Unspecified sequelae of cerebral infarction: Secondary | ICD-10-CM | POA: Diagnosis not present

## 2019-06-16 DIAGNOSIS — R2681 Unsteadiness on feet: Secondary | ICD-10-CM | POA: Diagnosis not present

## 2019-06-16 DIAGNOSIS — K219 Gastro-esophageal reflux disease without esophagitis: Secondary | ICD-10-CM | POA: Diagnosis not present

## 2019-06-16 DIAGNOSIS — R278 Other lack of coordination: Secondary | ICD-10-CM | POA: Diagnosis not present

## 2019-06-19 DIAGNOSIS — I6930 Unspecified sequelae of cerebral infarction: Secondary | ICD-10-CM | POA: Diagnosis not present

## 2019-06-19 DIAGNOSIS — R278 Other lack of coordination: Secondary | ICD-10-CM | POA: Diagnosis not present

## 2019-06-19 DIAGNOSIS — I69319 Unspecified symptoms and signs involving cognitive functions following cerebral infarction: Secondary | ICD-10-CM | POA: Diagnosis not present

## 2019-06-19 DIAGNOSIS — Z741 Need for assistance with personal care: Secondary | ICD-10-CM | POA: Diagnosis not present

## 2019-06-19 DIAGNOSIS — K219 Gastro-esophageal reflux disease without esophagitis: Secondary | ICD-10-CM | POA: Diagnosis not present

## 2019-06-19 DIAGNOSIS — R2681 Unsteadiness on feet: Secondary | ICD-10-CM | POA: Diagnosis not present

## 2019-06-20 DIAGNOSIS — K219 Gastro-esophageal reflux disease without esophagitis: Secondary | ICD-10-CM | POA: Diagnosis not present

## 2019-06-20 DIAGNOSIS — R278 Other lack of coordination: Secondary | ICD-10-CM | POA: Diagnosis not present

## 2019-06-20 DIAGNOSIS — Z741 Need for assistance with personal care: Secondary | ICD-10-CM | POA: Diagnosis not present

## 2019-06-20 DIAGNOSIS — I6930 Unspecified sequelae of cerebral infarction: Secondary | ICD-10-CM | POA: Diagnosis not present

## 2019-06-20 DIAGNOSIS — I69319 Unspecified symptoms and signs involving cognitive functions following cerebral infarction: Secondary | ICD-10-CM | POA: Diagnosis not present

## 2019-06-20 DIAGNOSIS — R2681 Unsteadiness on feet: Secondary | ICD-10-CM | POA: Diagnosis not present

## 2019-06-21 DIAGNOSIS — K219 Gastro-esophageal reflux disease without esophagitis: Secondary | ICD-10-CM | POA: Diagnosis not present

## 2019-06-21 DIAGNOSIS — I69319 Unspecified symptoms and signs involving cognitive functions following cerebral infarction: Secondary | ICD-10-CM | POA: Diagnosis not present

## 2019-06-21 DIAGNOSIS — I6930 Unspecified sequelae of cerebral infarction: Secondary | ICD-10-CM | POA: Diagnosis not present

## 2019-06-21 DIAGNOSIS — R2681 Unsteadiness on feet: Secondary | ICD-10-CM | POA: Diagnosis not present

## 2019-06-21 DIAGNOSIS — Z741 Need for assistance with personal care: Secondary | ICD-10-CM | POA: Diagnosis not present

## 2019-06-21 DIAGNOSIS — R278 Other lack of coordination: Secondary | ICD-10-CM | POA: Diagnosis not present

## 2019-06-22 DIAGNOSIS — I69319 Unspecified symptoms and signs involving cognitive functions following cerebral infarction: Secondary | ICD-10-CM | POA: Diagnosis not present

## 2019-06-22 DIAGNOSIS — R2681 Unsteadiness on feet: Secondary | ICD-10-CM | POA: Diagnosis not present

## 2019-06-22 DIAGNOSIS — R278 Other lack of coordination: Secondary | ICD-10-CM | POA: Diagnosis not present

## 2019-06-22 DIAGNOSIS — Z741 Need for assistance with personal care: Secondary | ICD-10-CM | POA: Diagnosis not present

## 2019-06-22 DIAGNOSIS — I6930 Unspecified sequelae of cerebral infarction: Secondary | ICD-10-CM | POA: Diagnosis not present

## 2019-06-22 DIAGNOSIS — K219 Gastro-esophageal reflux disease without esophagitis: Secondary | ICD-10-CM | POA: Diagnosis not present

## 2019-06-23 DIAGNOSIS — R2681 Unsteadiness on feet: Secondary | ICD-10-CM | POA: Diagnosis not present

## 2019-06-23 DIAGNOSIS — I69319 Unspecified symptoms and signs involving cognitive functions following cerebral infarction: Secondary | ICD-10-CM | POA: Diagnosis not present

## 2019-06-23 DIAGNOSIS — I6930 Unspecified sequelae of cerebral infarction: Secondary | ICD-10-CM | POA: Diagnosis not present

## 2019-06-23 DIAGNOSIS — K219 Gastro-esophageal reflux disease without esophagitis: Secondary | ICD-10-CM | POA: Diagnosis not present

## 2019-06-23 DIAGNOSIS — R278 Other lack of coordination: Secondary | ICD-10-CM | POA: Diagnosis not present

## 2019-06-23 DIAGNOSIS — Z741 Need for assistance with personal care: Secondary | ICD-10-CM | POA: Diagnosis not present

## 2019-06-26 DIAGNOSIS — I6930 Unspecified sequelae of cerebral infarction: Secondary | ICD-10-CM | POA: Diagnosis not present

## 2019-06-26 DIAGNOSIS — K219 Gastro-esophageal reflux disease without esophagitis: Secondary | ICD-10-CM | POA: Diagnosis not present

## 2019-06-26 DIAGNOSIS — Z741 Need for assistance with personal care: Secondary | ICD-10-CM | POA: Diagnosis not present

## 2019-06-26 DIAGNOSIS — R278 Other lack of coordination: Secondary | ICD-10-CM | POA: Diagnosis not present

## 2019-06-26 DIAGNOSIS — I69319 Unspecified symptoms and signs involving cognitive functions following cerebral infarction: Secondary | ICD-10-CM | POA: Diagnosis not present

## 2019-06-26 DIAGNOSIS — R2681 Unsteadiness on feet: Secondary | ICD-10-CM | POA: Diagnosis not present

## 2019-06-27 DIAGNOSIS — R278 Other lack of coordination: Secondary | ICD-10-CM | POA: Diagnosis not present

## 2019-06-27 DIAGNOSIS — I6930 Unspecified sequelae of cerebral infarction: Secondary | ICD-10-CM | POA: Diagnosis not present

## 2019-06-27 DIAGNOSIS — K219 Gastro-esophageal reflux disease without esophagitis: Secondary | ICD-10-CM | POA: Diagnosis not present

## 2019-06-27 DIAGNOSIS — I69319 Unspecified symptoms and signs involving cognitive functions following cerebral infarction: Secondary | ICD-10-CM | POA: Diagnosis not present

## 2019-06-27 DIAGNOSIS — Z741 Need for assistance with personal care: Secondary | ICD-10-CM | POA: Diagnosis not present

## 2019-06-27 DIAGNOSIS — R2681 Unsteadiness on feet: Secondary | ICD-10-CM | POA: Diagnosis not present

## 2019-06-28 DIAGNOSIS — I69319 Unspecified symptoms and signs involving cognitive functions following cerebral infarction: Secondary | ICD-10-CM | POA: Diagnosis not present

## 2019-06-28 DIAGNOSIS — I10 Essential (primary) hypertension: Secondary | ICD-10-CM | POA: Diagnosis not present

## 2019-06-28 DIAGNOSIS — K219 Gastro-esophageal reflux disease without esophagitis: Secondary | ICD-10-CM | POA: Diagnosis not present

## 2019-06-28 DIAGNOSIS — D320 Benign neoplasm of cerebral meninges: Secondary | ICD-10-CM | POA: Diagnosis not present

## 2019-06-28 DIAGNOSIS — F0150 Vascular dementia without behavioral disturbance: Secondary | ICD-10-CM | POA: Diagnosis not present

## 2019-06-28 DIAGNOSIS — R278 Other lack of coordination: Secondary | ICD-10-CM | POA: Diagnosis not present

## 2019-06-28 DIAGNOSIS — M545 Low back pain: Secondary | ICD-10-CM | POA: Diagnosis not present

## 2019-06-28 DIAGNOSIS — I4891 Unspecified atrial fibrillation: Secondary | ICD-10-CM | POA: Diagnosis not present

## 2019-06-28 DIAGNOSIS — I6930 Unspecified sequelae of cerebral infarction: Secondary | ICD-10-CM | POA: Diagnosis not present

## 2019-06-28 DIAGNOSIS — E43 Unspecified severe protein-calorie malnutrition: Secondary | ICD-10-CM | POA: Diagnosis not present

## 2019-06-28 DIAGNOSIS — R2681 Unsteadiness on feet: Secondary | ICD-10-CM | POA: Diagnosis not present

## 2019-06-28 DIAGNOSIS — F39 Unspecified mood [affective] disorder: Secondary | ICD-10-CM | POA: Diagnosis not present

## 2019-06-28 DIAGNOSIS — Z741 Need for assistance with personal care: Secondary | ICD-10-CM | POA: Diagnosis not present

## 2019-06-29 DIAGNOSIS — K219 Gastro-esophageal reflux disease without esophagitis: Secondary | ICD-10-CM | POA: Diagnosis not present

## 2019-06-29 DIAGNOSIS — I6930 Unspecified sequelae of cerebral infarction: Secondary | ICD-10-CM | POA: Diagnosis not present

## 2019-06-29 DIAGNOSIS — R278 Other lack of coordination: Secondary | ICD-10-CM | POA: Diagnosis not present

## 2019-06-29 DIAGNOSIS — R2681 Unsteadiness on feet: Secondary | ICD-10-CM | POA: Diagnosis not present

## 2019-06-29 DIAGNOSIS — I69319 Unspecified symptoms and signs involving cognitive functions following cerebral infarction: Secondary | ICD-10-CM | POA: Diagnosis not present

## 2019-06-29 DIAGNOSIS — Z741 Need for assistance with personal care: Secondary | ICD-10-CM | POA: Diagnosis not present

## 2019-07-07 DIAGNOSIS — Z03818 Encounter for observation for suspected exposure to other biological agents ruled out: Secondary | ICD-10-CM | POA: Diagnosis not present

## 2019-07-11 DIAGNOSIS — B342 Coronavirus infection, unspecified: Secondary | ICD-10-CM | POA: Diagnosis not present

## 2019-07-17 DIAGNOSIS — F39 Unspecified mood [affective] disorder: Secondary | ICD-10-CM | POA: Diagnosis not present

## 2019-07-17 DIAGNOSIS — M545 Low back pain: Secondary | ICD-10-CM | POA: Diagnosis not present

## 2019-07-17 DIAGNOSIS — I10 Essential (primary) hypertension: Secondary | ICD-10-CM | POA: Diagnosis not present

## 2019-07-17 DIAGNOSIS — I480 Paroxysmal atrial fibrillation: Secondary | ICD-10-CM | POA: Diagnosis not present

## 2019-07-17 DIAGNOSIS — F0150 Vascular dementia without behavioral disturbance: Secondary | ICD-10-CM | POA: Diagnosis not present

## 2019-08-17 DIAGNOSIS — Z03818 Encounter for observation for suspected exposure to other biological agents ruled out: Secondary | ICD-10-CM | POA: Diagnosis not present

## 2019-08-23 DIAGNOSIS — Z03818 Encounter for observation for suspected exposure to other biological agents ruled out: Secondary | ICD-10-CM | POA: Diagnosis not present

## 2019-08-24 DIAGNOSIS — R441 Visual hallucinations: Secondary | ICD-10-CM | POA: Diagnosis not present

## 2019-08-30 DIAGNOSIS — B351 Tinea unguium: Secondary | ICD-10-CM | POA: Diagnosis not present

## 2019-09-02 ENCOUNTER — Other Ambulatory Visit: Payer: Self-pay

## 2019-09-02 ENCOUNTER — Emergency Department: Payer: Medicare Other

## 2019-09-02 ENCOUNTER — Emergency Department
Admission: EM | Admit: 2019-09-02 | Discharge: 2019-09-02 | Disposition: A | Discharge status: Skilled Nursing Facility | Payer: Medicare Other | Attending: Emergency Medicine | Admitting: Emergency Medicine

## 2019-09-02 ENCOUNTER — Encounter: Payer: Self-pay | Admitting: Emergency Medicine

## 2019-09-02 DIAGNOSIS — Z743 Need for continuous supervision: Secondary | ICD-10-CM | POA: Diagnosis not present

## 2019-09-02 DIAGNOSIS — M5489 Other dorsalgia: Secondary | ICD-10-CM | POA: Diagnosis not present

## 2019-09-02 DIAGNOSIS — Z7901 Long term (current) use of anticoagulants: Secondary | ICD-10-CM | POA: Insufficient documentation

## 2019-09-02 DIAGNOSIS — M545 Low back pain: Secondary | ICD-10-CM | POA: Insufficient documentation

## 2019-09-02 DIAGNOSIS — G459 Transient cerebral ischemic attack, unspecified: Secondary | ICD-10-CM

## 2019-09-02 DIAGNOSIS — Z79899 Other long term (current) drug therapy: Secondary | ICD-10-CM | POA: Diagnosis not present

## 2019-09-02 DIAGNOSIS — R0902 Hypoxemia: Secondary | ICD-10-CM | POA: Diagnosis not present

## 2019-09-02 DIAGNOSIS — R531 Weakness: Secondary | ICD-10-CM | POA: Diagnosis not present

## 2019-09-02 DIAGNOSIS — I10 Essential (primary) hypertension: Secondary | ICD-10-CM | POA: Insufficient documentation

## 2019-09-02 DIAGNOSIS — R479 Unspecified speech disturbances: Secondary | ICD-10-CM | POA: Diagnosis not present

## 2019-09-02 DIAGNOSIS — R279 Unspecified lack of coordination: Secondary | ICD-10-CM | POA: Diagnosis not present

## 2019-09-02 DIAGNOSIS — R4701 Aphasia: Secondary | ICD-10-CM | POA: Diagnosis present

## 2019-09-02 DIAGNOSIS — Z86011 Personal history of benign neoplasm of the brain: Secondary | ICD-10-CM | POA: Diagnosis not present

## 2019-09-02 LAB — URINALYSIS, COMPLETE (UACMP) WITH MICROSCOPIC
Bilirubin Urine: NEGATIVE
Glucose, UA: NEGATIVE mg/dL
Hgb urine dipstick: NEGATIVE
Ketones, ur: NEGATIVE mg/dL
Leukocytes,Ua: NEGATIVE
Nitrite: NEGATIVE
Protein, ur: NEGATIVE mg/dL
Specific Gravity, Urine: 1.006 (ref 1.005–1.030)
pH: 7 (ref 5.0–8.0)

## 2019-09-02 LAB — COMPREHENSIVE METABOLIC PANEL
ALT: 16 U/L (ref 0–44)
AST: 19 U/L (ref 15–41)
Albumin: 4 g/dL (ref 3.5–5.0)
Alkaline Phosphatase: 46 U/L (ref 38–126)
Anion gap: 7 (ref 5–15)
BUN: 21 mg/dL (ref 8–23)
CO2: 27 mmol/L (ref 22–32)
Calcium: 8.7 mg/dL — ABNORMAL LOW (ref 8.9–10.3)
Chloride: 100 mmol/L (ref 98–111)
Creatinine, Ser: 0.57 mg/dL (ref 0.44–1.00)
GFR calc Af Amer: >60 mL/min (ref >60)
GFR calc non Af Amer: >60 mL/min (ref >60)
Glucose, Bld: 105 mg/dL — ABNORMAL HIGH (ref 70–99)
Potassium: 4.3 mmol/L (ref 3.5–5.1)
Sodium: 134 mmol/L — ABNORMAL LOW (ref 135–145)
Total Bilirubin: 0.8 mg/dL (ref 0.3–1.2)
Total Protein: 6.5 g/dL (ref 6.5–8.1)

## 2019-09-02 LAB — CBC
HCT: 41.4 % (ref 36.0–46.0)
Hemoglobin: 13.5 g/dL (ref 12.0–15.0)
MCH: 29.8 pg (ref 26.0–34.0)
MCHC: 32.6 g/dL (ref 30.0–36.0)
MCV: 91.4 fL (ref 80.0–100.0)
Platelets: 613 10*3/uL — ABNORMAL HIGH (ref 150–400)
RBC: 4.53 MIL/uL (ref 3.87–5.11)
RDW: 11.9 % (ref 11.5–15.5)
WBC: 11 10*3/uL — ABNORMAL HIGH (ref 4.0–10.5)
nRBC: 0 % (ref 0.0–0.2)

## 2019-09-02 LAB — TROPONIN I (HIGH SENSITIVITY): Troponin I (High Sensitivity): 3 ng/L (ref <18)

## 2019-09-02 NOTE — ED Notes (Signed)
Pt given 2 more warm blankets.

## 2019-09-02 NOTE — ED Provider Notes (Signed)
[facility] Emergency Department Provider Note  Time seen: 12:21 PM  I have reviewed the triage vital signs and the nursing notes.   HISTORY  Chief Complaint Aphasia   HPI Shelly Padilla is a 85 y.o. female with a past medical history of atrial fibrillation, hypertension, prior CVA, presents to the emergency department for a aphasia.  According to EMS report patient lives at [facility] facility, they were called out due to expressive aphasia, states the patient was saying words that did not make any sense.  EMS states upon their arrival patient was back to normal.  Upon arrival to the emergency department patient is awake alert and oriented x4, does not feel like she is having any speech difficulty at this time.  Denies any medical complaints currently.   Past Medical History:  Diagnosis Date  . Atrial fibrillation ([place])   . Brain tumor ([place])    meningioma  . Glaucoma   . H/O paroxysmal supraventricular tachycardia    documented by Holter Monitor  . Hemorrhoids   . Hiatal hernia   . History of ovarian cyst   . HTN (hypertension)   . Insomnia   . Palpitations   . Thrombocytosis [facility])     Patient Active Problem List   Diagnosis Date Noted  . Acute CVA (cerebrovascular accident) ([place]) 04/27/2019  . Altered mental status 11/27/2018  . Atrial fibrillation, chronic 11/27/2018  . AMS (altered mental status) 11/27/2018  . MDD (major depressive disorder), single episode, moderate ([place]) 09/08/2018  . Duodenitis 07/14/2018  . 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 Carotid artery disease ([place]) 03/03/2018  . TIA (transient ischemic attack) 11/26/2017  . Chronic venous insufficiency 06/09/2016  . Right inguinal hernia 06/09/2016  . DD (diverticular disease) 04/10/2016  . Glaucoma 04/10/2016  . Hemorrhoid 04/10/2016  . Bilateral ovarian cysts 03/18/2016  . Thrombocytosis ([place])   . Preventative health care 06/28/2015  . Varicose veins 01/31/2015  . Advance  directive discussed with patient 12/26/2014  . Meningioma ([place]) 10/01/2014  . Insomnia 05/18/2014  . Episodic mood disorder ([place]) 10/18/2012  . Paroxysmal supraventricular tachycardia ([place]) 03/08/2012  . HTN (hypertension) 03/08/2012    Past Surgical History:  Procedure Laterality Date  . CATARACT EXTRACTION     left eye  . Leg vein stripping  1970  . TONSILLECTOMY  1929    Prior to Admission medications   Medication Sig Start Date End Date Taking? Authorizing Provider  acetaminophen (TYLENOL) 500 MG tablet Take 1,000 mg by mouth 3 (three) times daily.    Yes [provider]  apixaban (ELIQUIS) 2.5 MG TABS tablet Take 2.5 mg by mouth 2 (two) times daily.   Yes [provider]  atorvastatin (LIPITOR) 10 MG tablet Take 1 tablet (10 mg total) by mouth daily. 11/30/18 11/30/19 Yes Gouru, Illene Silver, MD  famotidine (PEPCID) 20 MG tablet Take 1 tablet (20 mg total) by mouth 2 (two) times daily. Patient taking differently: Take 20 mg by mouth at bedtime.  07/04/18  Yes Paulette Blanch, MD  memantine (NAMENDA) 5 MG tablet Take 5 mg by mouth 2 (two) times daily.   Yes [provider]  metoprolol tartrate (LOPRESSOR) 25 MG tablet Take 25 mg by mouth 2 (two) times daily.   Yes [provider]  Multiple Vitamins-Minerals (CENTRUM SILVER PO) Take 1 tablet by mouth daily.    Yes [provider]  polyethylene glycol (MIRALAX / GLYCOLAX) packet Take 17 g by mouth daily.    Yes [provider]  ramipril (ALTACE) 10 MG capsule TAKE ONE CAPSULE BY MOUTH ONCE DAILY Patient taking differently: Take 10 mg by mouth daily.  10/07/16  Yes Gollan, Kathlene November, MD  sennosides-docusate sodium (SENOKOT-S) 8.6-50 MG tablet Take 1 tablet by mouth daily as needed for constipation.    Yes [provider]  timolol (TIMOPTIC) 0.5 % ophthalmic solution Place 1 drop into both eyes every evening.    Yes [provider]  traMADol (ULTRAM) 50 MG tablet Take 50 mg  by mouth every 6 (six) hours as needed for moderate pain.   Yes [provider]  aspirin EC 81 MG EC tablet Take 1 tablet (81 mg total) by mouth daily. Patient not taking: Reported on 09/02/2019 11/30/18   Nicholes Mango, MD  metoprolol succinate (TOPROL-XL) 50 MG 24 hr tablet Take 1 tablet (50 mg total) by mouth daily with supper. Take with or immediately following a meal. Patient not taking: Reported on 09/02/2019 09/28/17 09/02/19  Minna Merritts, MD    Allergies  Allergen Reactions  . Amoxil [Amoxicillin] Anxiety  . Codeine   . Pacerone  [Amiodarone Hcl] Nausea And Vomiting  . Sulfa Drugs Cross Reactors   . Amlodipine Other (See Comments)    Mild edema    Family History  Problem Relation Age of Onset  . Heart attack Father   . Heart failure Brother     Social History Social History   Tobacco Use  . Smoking status: Never Smoker  . Smokeless tobacco: Never Used  Substance Use Topics  . Alcohol use: Yes    Alcohol/week: 0.0 standard drinks    Comment: occasionally wine  . Drug use: No    Review of Systems Constitutional: Negative for fever. Cardiovascular: Negative for chest pain. Respiratory: Negative for shortness of breath. Gastrointestinal: Negative for abdominal pain Neurological: Speech difficulty earlier this morning.  None currently.  Denies any weakness or numbness. All other ROS negative  ____________________________________________   PHYSICAL EXAM:  VITAL SIGNS: ED Triage Vitals  Enc Vitals Group     BP 09/02/19 1156 (!) 190/101     Pulse Rate 09/02/19 1156 82     Resp 09/02/19 1156 19     Temp 09/02/19 1156 98.6 F (37 C)     Temp Source 09/02/19 1156 Oral     SpO2 09/02/19 1156 97 %     Weight 09/02/19 1158 143 lb 4.8 oz (65 kg)     Height 09/02/19 1158 5\' 7"  (1.702 m)     Head Circumference --      Peak Flow --      Pain Score 09/02/19 1158 6     Pain Loc --      Pain Edu? --      Excl. in [place]? --    Constitutional: Alert and  oriented. Well appearing and in no distress. Eyes: Normal exam ENT      Head: Normocephalic and atraumatic.      Mouth/Throat: Mucous membranes are moist. Cardiovascular: Normal rate, regular rhythm.  Respiratory: Normal respiratory effort without tachypnea nor retractions. Breath sounds are clear  Gastrointestinal: Soft and nontender. No distention.   Musculoskeletal: Nontender with normal range of motion in all extremities.  Neurologic:  Normal speech and language. No gross focal neurologic deficits  Skin:  Skin is warm, dry and intact.  Psychiatric: Mood and affect are normal.   ____________________________________________    EKG  EKG viewed and interpreted by myself shows sinus tachycardia 100 bpm with  a narrow QRS, normal axis, normal intervals, nonspecific ST changes.  ____________________________________________    RADIOLOGY  CT head shows stable findings no acute abnormality.  ____________________________________________   INITIAL IMPRESSION / ASSESSMENT AND PLAN / ED COURSE  Pertinent labs & imaging results that were available during my care of the patient were reviewed by me and considered in my medical decision making (see chart for details).   Patient presents to the emergency department for difficulty speaking this morning which has since resolved.  Patient is back to normal with a normal neurological exam normal speech currently.  Overall the patient appears very well.  Differential would include CVA, ICH, TIA, metabolic abnormality or infectious etiology.  We will check labs, CT scan of the head and continue to closely monitor.  Overall the patient continues to appear extremely well, she has been able to ambulate without issue.  No difficulty speaking.  CT scan shows no acute abnormalities.  Lab work is largely [place].  I discussed with the patient the likelihood of a TIA and offered to admit her to the hospital.  Patient strongly wishes to go home, states she  would not want to be on a blood thinner regardless of the findings.  Patient appears to have capacity to make her own medical decisions.  I believe the choice to be discharged home is reasonable given no current symptoms and her age.  Patient states she will follow-up with her doctor.  Kamrin Allendorf was evaluated in Emergency Department on 09/02/2019 for the symptoms described in the history of present illness. She was evaluated in the context of the global COVID-19 pandemic, which necessitated consideration that the patient might be at risk for infection with the SARS-CoV-2 virus that causes COVID-19. Institutional protocols and algorithms that pertain to the evaluation of patients at risk for COVID-19 are in a state of rapid change based on information released by regulatory bodies including the CDC and federal and state organizations. These policies and algorithms were followed during the patient's care in the ED.  ____________________________________________   FINAL CLINICAL IMPRESSION(S) / ED DIAGNOSES  TIA   Harvest Dark, MD 09/02/19 1420

## 2019-09-02 NOTE — ED Notes (Addendum)
This RN spoke with Customer service manager from [place] [phone]. RN reports expressive aphasia at 10:45am, c/o general weakness and extreme lower back pain an unable to ambulate w/out assistance. Zeni Migues son's phone number [phone]

## 2019-09-02 NOTE — ED Triage Notes (Signed)
Pt from [facility] via [employer]. Per EMS, staff reported pt with expressive aphasia, upon arrival to [facility] pt A/Ox4, no changes in speech noted by EMS> EMS VS 98 oral; 134CBG; 190/110; HR 70. Hx of UTI's and Afib.

## 2019-09-02 NOTE — ED Notes (Signed)
Pt oob to br with 1 mod assist

## 2019-09-02 NOTE — ED Notes (Signed)
Per gail, rn [facility], pt to be transported back by ems as TL has no transportation on weekends.

## 2019-09-02 NOTE — ED Notes (Signed)
Pt oob to br with 1 mod assist.

## 2019-09-02 NOTE — ED Notes (Signed)
Pt oob x 3 to br with moderate 1 person assist. No weakness or deficits notes.

## 2019-09-02 NOTE — ED Notes (Signed)
Called [facility] for transport to [place]

## 2019-09-02 NOTE — ED Notes (Signed)
EMS at bedside

## 2019-09-04 DIAGNOSIS — G459 Transient cerebral ischemic attack, unspecified: Secondary | ICD-10-CM | POA: Diagnosis not present

## 2019-09-04 DIAGNOSIS — I10 Essential (primary) hypertension: Secondary | ICD-10-CM | POA: Diagnosis not present

## 2019-09-12 DIAGNOSIS — R4701 Aphasia: Secondary | ICD-10-CM | POA: Diagnosis not present

## 2019-09-15 DIAGNOSIS — Z03818 Encounter for observation for suspected exposure to other biological agents ruled out: Secondary | ICD-10-CM | POA: Diagnosis not present

## 2019-09-19 DIAGNOSIS — K219 Gastro-esophageal reflux disease without esophagitis: Secondary | ICD-10-CM | POA: Diagnosis not present

## 2019-09-19 DIAGNOSIS — Z8673 Personal history of transient ischemic attack (TIA), and cerebral infarction without residual deficits: Secondary | ICD-10-CM | POA: Diagnosis not present

## 2019-09-19 DIAGNOSIS — R278 Other lack of coordination: Secondary | ICD-10-CM | POA: Diagnosis not present

## 2019-09-19 DIAGNOSIS — R2681 Unsteadiness on feet: Secondary | ICD-10-CM | POA: Diagnosis not present

## 2019-09-19 DIAGNOSIS — I69319 Unspecified symptoms and signs involving cognitive functions following cerebral infarction: Secondary | ICD-10-CM | POA: Diagnosis not present

## 2019-09-19 DIAGNOSIS — I482 Chronic atrial fibrillation, unspecified: Secondary | ICD-10-CM | POA: Diagnosis not present

## 2019-09-19 DIAGNOSIS — F0150 Vascular dementia without behavioral disturbance: Secondary | ICD-10-CM | POA: Diagnosis not present

## 2019-09-19 DIAGNOSIS — Z741 Need for assistance with personal care: Secondary | ICD-10-CM | POA: Diagnosis not present

## 2019-09-19 DIAGNOSIS — I6930 Unspecified sequelae of cerebral infarction: Secondary | ICD-10-CM | POA: Diagnosis not present

## 2019-09-19 DIAGNOSIS — R4189 Other symptoms and signs involving cognitive functions and awareness: Secondary | ICD-10-CM | POA: Diagnosis not present

## 2019-09-20 DIAGNOSIS — I69319 Unspecified symptoms and signs involving cognitive functions following cerebral infarction: Secondary | ICD-10-CM | POA: Diagnosis not present

## 2019-09-20 DIAGNOSIS — R278 Other lack of coordination: Secondary | ICD-10-CM | POA: Diagnosis not present

## 2019-09-20 DIAGNOSIS — Z741 Need for assistance with personal care: Secondary | ICD-10-CM | POA: Diagnosis not present

## 2019-09-20 DIAGNOSIS — I6930 Unspecified sequelae of cerebral infarction: Secondary | ICD-10-CM | POA: Diagnosis not present

## 2019-09-20 DIAGNOSIS — K219 Gastro-esophageal reflux disease without esophagitis: Secondary | ICD-10-CM | POA: Diagnosis not present

## 2019-09-20 DIAGNOSIS — R2681 Unsteadiness on feet: Secondary | ICD-10-CM | POA: Diagnosis not present

## 2019-09-21 DIAGNOSIS — R278 Other lack of coordination: Secondary | ICD-10-CM | POA: Diagnosis not present

## 2019-09-21 DIAGNOSIS — K219 Gastro-esophageal reflux disease without esophagitis: Secondary | ICD-10-CM | POA: Diagnosis not present

## 2019-09-21 DIAGNOSIS — I69319 Unspecified symptoms and signs involving cognitive functions following cerebral infarction: Secondary | ICD-10-CM | POA: Diagnosis not present

## 2019-09-21 DIAGNOSIS — R2681 Unsteadiness on feet: Secondary | ICD-10-CM | POA: Diagnosis not present

## 2019-09-21 DIAGNOSIS — Z741 Need for assistance with personal care: Secondary | ICD-10-CM | POA: Diagnosis not present

## 2019-09-21 DIAGNOSIS — I6930 Unspecified sequelae of cerebral infarction: Secondary | ICD-10-CM | POA: Diagnosis not present

## 2019-09-22 DIAGNOSIS — R2681 Unsteadiness on feet: Secondary | ICD-10-CM | POA: Diagnosis not present

## 2019-09-22 DIAGNOSIS — K219 Gastro-esophageal reflux disease without esophagitis: Secondary | ICD-10-CM | POA: Diagnosis not present

## 2019-09-22 DIAGNOSIS — I69319 Unspecified symptoms and signs involving cognitive functions following cerebral infarction: Secondary | ICD-10-CM | POA: Diagnosis not present

## 2019-09-22 DIAGNOSIS — Z741 Need for assistance with personal care: Secondary | ICD-10-CM | POA: Diagnosis not present

## 2019-09-22 DIAGNOSIS — R278 Other lack of coordination: Secondary | ICD-10-CM | POA: Diagnosis not present

## 2019-09-22 DIAGNOSIS — I6930 Unspecified sequelae of cerebral infarction: Secondary | ICD-10-CM | POA: Diagnosis not present

## 2019-09-25 DIAGNOSIS — I69319 Unspecified symptoms and signs involving cognitive functions following cerebral infarction: Secondary | ICD-10-CM | POA: Diagnosis not present

## 2019-09-25 DIAGNOSIS — R278 Other lack of coordination: Secondary | ICD-10-CM | POA: Diagnosis not present

## 2019-09-25 DIAGNOSIS — R2681 Unsteadiness on feet: Secondary | ICD-10-CM | POA: Diagnosis not present

## 2019-09-25 DIAGNOSIS — K219 Gastro-esophageal reflux disease without esophagitis: Secondary | ICD-10-CM | POA: Diagnosis not present

## 2019-09-25 DIAGNOSIS — Z741 Need for assistance with personal care: Secondary | ICD-10-CM | POA: Diagnosis not present

## 2019-09-25 DIAGNOSIS — I6930 Unspecified sequelae of cerebral infarction: Secondary | ICD-10-CM | POA: Diagnosis not present

## 2019-09-26 DIAGNOSIS — I69319 Unspecified symptoms and signs involving cognitive functions following cerebral infarction: Secondary | ICD-10-CM | POA: Diagnosis not present

## 2019-09-26 DIAGNOSIS — R278 Other lack of coordination: Secondary | ICD-10-CM | POA: Diagnosis not present

## 2019-09-26 DIAGNOSIS — R2681 Unsteadiness on feet: Secondary | ICD-10-CM | POA: Diagnosis not present

## 2019-09-26 DIAGNOSIS — K219 Gastro-esophageal reflux disease without esophagitis: Secondary | ICD-10-CM | POA: Diagnosis not present

## 2019-09-26 DIAGNOSIS — Z741 Need for assistance with personal care: Secondary | ICD-10-CM | POA: Diagnosis not present

## 2019-09-26 DIAGNOSIS — I6930 Unspecified sequelae of cerebral infarction: Secondary | ICD-10-CM | POA: Diagnosis not present

## 2019-09-27 DIAGNOSIS — K219 Gastro-esophageal reflux disease without esophagitis: Secondary | ICD-10-CM | POA: Diagnosis not present

## 2019-09-27 DIAGNOSIS — R278 Other lack of coordination: Secondary | ICD-10-CM | POA: Diagnosis not present

## 2019-09-27 DIAGNOSIS — Z741 Need for assistance with personal care: Secondary | ICD-10-CM | POA: Diagnosis not present

## 2019-09-27 DIAGNOSIS — R2681 Unsteadiness on feet: Secondary | ICD-10-CM | POA: Diagnosis not present

## 2019-09-27 DIAGNOSIS — I69319 Unspecified symptoms and signs involving cognitive functions following cerebral infarction: Secondary | ICD-10-CM | POA: Diagnosis not present

## 2019-09-27 DIAGNOSIS — I6930 Unspecified sequelae of cerebral infarction: Secondary | ICD-10-CM | POA: Diagnosis not present

## 2019-09-28 DIAGNOSIS — R278 Other lack of coordination: Secondary | ICD-10-CM | POA: Diagnosis not present

## 2019-09-28 DIAGNOSIS — Z741 Need for assistance with personal care: Secondary | ICD-10-CM | POA: Diagnosis not present

## 2019-09-28 DIAGNOSIS — I69319 Unspecified symptoms and signs involving cognitive functions following cerebral infarction: Secondary | ICD-10-CM | POA: Diagnosis not present

## 2019-09-28 DIAGNOSIS — I6930 Unspecified sequelae of cerebral infarction: Secondary | ICD-10-CM | POA: Diagnosis not present

## 2019-09-28 DIAGNOSIS — R2681 Unsteadiness on feet: Secondary | ICD-10-CM | POA: Diagnosis not present

## 2019-09-28 DIAGNOSIS — K219 Gastro-esophageal reflux disease without esophagitis: Secondary | ICD-10-CM | POA: Diagnosis not present

## 2019-09-29 DIAGNOSIS — I69319 Unspecified symptoms and signs involving cognitive functions following cerebral infarction: Secondary | ICD-10-CM | POA: Diagnosis not present

## 2019-09-29 DIAGNOSIS — I6930 Unspecified sequelae of cerebral infarction: Secondary | ICD-10-CM | POA: Diagnosis not present

## 2019-09-29 DIAGNOSIS — Z741 Need for assistance with personal care: Secondary | ICD-10-CM | POA: Diagnosis not present

## 2019-09-29 DIAGNOSIS — R278 Other lack of coordination: Secondary | ICD-10-CM | POA: Diagnosis not present

## 2019-09-29 DIAGNOSIS — R2681 Unsteadiness on feet: Secondary | ICD-10-CM | POA: Diagnosis not present

## 2019-09-29 DIAGNOSIS — K219 Gastro-esophageal reflux disease without esophagitis: Secondary | ICD-10-CM | POA: Diagnosis not present

## 2019-10-02 DIAGNOSIS — F321 Major depressive disorder, single episode, moderate: Secondary | ICD-10-CM | POA: Diagnosis not present

## 2019-10-04 DIAGNOSIS — F39 Unspecified mood [affective] disorder: Secondary | ICD-10-CM | POA: Diagnosis not present

## 2019-10-04 DIAGNOSIS — I4891 Unspecified atrial fibrillation: Secondary | ICD-10-CM | POA: Diagnosis not present

## 2019-10-04 DIAGNOSIS — I10 Essential (primary) hypertension: Secondary | ICD-10-CM | POA: Diagnosis not present

## 2019-10-04 DIAGNOSIS — F0150 Vascular dementia without behavioral disturbance: Secondary | ICD-10-CM | POA: Diagnosis not present

## 2019-10-04 DIAGNOSIS — M545 Low back pain: Secondary | ICD-10-CM

## 2019-10-04 DIAGNOSIS — K219 Gastro-esophageal reflux disease without esophagitis: Secondary | ICD-10-CM

## 2019-10-06 DIAGNOSIS — I69319 Unspecified symptoms and signs involving cognitive functions following cerebral infarction: Secondary | ICD-10-CM | POA: Diagnosis not present

## 2019-10-06 DIAGNOSIS — R278 Other lack of coordination: Secondary | ICD-10-CM | POA: Diagnosis not present

## 2019-10-06 DIAGNOSIS — Z741 Need for assistance with personal care: Secondary | ICD-10-CM | POA: Diagnosis not present

## 2019-10-06 DIAGNOSIS — I6930 Unspecified sequelae of cerebral infarction: Secondary | ICD-10-CM | POA: Diagnosis not present

## 2019-10-06 DIAGNOSIS — R2681 Unsteadiness on feet: Secondary | ICD-10-CM | POA: Diagnosis not present

## 2019-10-06 DIAGNOSIS — K219 Gastro-esophageal reflux disease without esophagitis: Secondary | ICD-10-CM | POA: Diagnosis not present

## 2019-10-23 DIAGNOSIS — M6281 Muscle weakness (generalized): Secondary | ICD-10-CM | POA: Diagnosis not present

## 2019-10-23 DIAGNOSIS — I6930 Unspecified sequelae of cerebral infarction: Secondary | ICD-10-CM | POA: Diagnosis not present

## 2019-10-23 DIAGNOSIS — R2681 Unsteadiness on feet: Secondary | ICD-10-CM | POA: Diagnosis not present

## 2019-10-23 DIAGNOSIS — Z741 Need for assistance with personal care: Secondary | ICD-10-CM | POA: Diagnosis not present

## 2019-10-23 DIAGNOSIS — Z8673 Personal history of transient ischemic attack (TIA), and cerebral infarction without residual deficits: Secondary | ICD-10-CM | POA: Diagnosis not present

## 2019-10-23 DIAGNOSIS — F419 Anxiety disorder, unspecified: Secondary | ICD-10-CM | POA: Diagnosis not present

## 2019-10-23 DIAGNOSIS — F0150 Vascular dementia without behavioral disturbance: Secondary | ICD-10-CM | POA: Diagnosis not present

## 2019-10-23 DIAGNOSIS — R4189 Other symptoms and signs involving cognitive functions and awareness: Secondary | ICD-10-CM | POA: Diagnosis not present

## 2019-10-23 DIAGNOSIS — I69319 Unspecified symptoms and signs involving cognitive functions following cerebral infarction: Secondary | ICD-10-CM | POA: Diagnosis not present

## 2019-10-23 DIAGNOSIS — R278 Other lack of coordination: Secondary | ICD-10-CM | POA: Diagnosis not present

## 2019-10-23 DIAGNOSIS — U071 COVID-19: Secondary | ICD-10-CM | POA: Diagnosis not present

## 2019-10-24 DIAGNOSIS — U071 COVID-19: Secondary | ICD-10-CM | POA: Diagnosis not present

## 2019-10-24 DIAGNOSIS — Z8673 Personal history of transient ischemic attack (TIA), and cerebral infarction without residual deficits: Secondary | ICD-10-CM | POA: Diagnosis not present

## 2019-10-24 DIAGNOSIS — M6281 Muscle weakness (generalized): Secondary | ICD-10-CM | POA: Diagnosis not present

## 2019-10-24 DIAGNOSIS — R278 Other lack of coordination: Secondary | ICD-10-CM | POA: Diagnosis not present

## 2019-10-24 DIAGNOSIS — F0150 Vascular dementia without behavioral disturbance: Secondary | ICD-10-CM | POA: Diagnosis not present

## 2019-10-24 DIAGNOSIS — F419 Anxiety disorder, unspecified: Secondary | ICD-10-CM | POA: Diagnosis not present

## 2019-10-25 DIAGNOSIS — R278 Other lack of coordination: Secondary | ICD-10-CM | POA: Diagnosis not present

## 2019-10-25 DIAGNOSIS — M6281 Muscle weakness (generalized): Secondary | ICD-10-CM | POA: Diagnosis not present

## 2019-10-25 DIAGNOSIS — Z8673 Personal history of transient ischemic attack (TIA), and cerebral infarction without residual deficits: Secondary | ICD-10-CM | POA: Diagnosis not present

## 2019-10-25 DIAGNOSIS — F419 Anxiety disorder, unspecified: Secondary | ICD-10-CM | POA: Diagnosis not present

## 2019-10-25 DIAGNOSIS — U071 COVID-19: Secondary | ICD-10-CM | POA: Diagnosis not present

## 2019-10-25 DIAGNOSIS — F0150 Vascular dementia without behavioral disturbance: Secondary | ICD-10-CM | POA: Diagnosis not present

## 2019-10-26 DIAGNOSIS — Z8673 Personal history of transient ischemic attack (TIA), and cerebral infarction without residual deficits: Secondary | ICD-10-CM | POA: Diagnosis not present

## 2019-10-26 DIAGNOSIS — R278 Other lack of coordination: Secondary | ICD-10-CM | POA: Diagnosis not present

## 2019-10-26 DIAGNOSIS — F419 Anxiety disorder, unspecified: Secondary | ICD-10-CM | POA: Diagnosis not present

## 2019-10-26 DIAGNOSIS — M6281 Muscle weakness (generalized): Secondary | ICD-10-CM | POA: Diagnosis not present

## 2019-10-26 DIAGNOSIS — U071 COVID-19: Secondary | ICD-10-CM | POA: Diagnosis not present

## 2019-10-26 DIAGNOSIS — F0150 Vascular dementia without behavioral disturbance: Secondary | ICD-10-CM | POA: Diagnosis not present

## 2019-10-27 DIAGNOSIS — F419 Anxiety disorder, unspecified: Secondary | ICD-10-CM | POA: Diagnosis not present

## 2019-10-27 DIAGNOSIS — R278 Other lack of coordination: Secondary | ICD-10-CM | POA: Diagnosis not present

## 2019-10-27 DIAGNOSIS — Z8673 Personal history of transient ischemic attack (TIA), and cerebral infarction without residual deficits: Secondary | ICD-10-CM | POA: Diagnosis not present

## 2019-10-27 DIAGNOSIS — F0150 Vascular dementia without behavioral disturbance: Secondary | ICD-10-CM | POA: Diagnosis not present

## 2019-10-27 DIAGNOSIS — M6281 Muscle weakness (generalized): Secondary | ICD-10-CM | POA: Diagnosis not present

## 2019-10-27 DIAGNOSIS — U071 COVID-19: Secondary | ICD-10-CM | POA: Diagnosis not present

## 2019-10-30 DIAGNOSIS — R278 Other lack of coordination: Secondary | ICD-10-CM | POA: Diagnosis not present

## 2019-10-30 DIAGNOSIS — U071 COVID-19: Secondary | ICD-10-CM | POA: Diagnosis not present

## 2019-10-30 DIAGNOSIS — F0150 Vascular dementia without behavioral disturbance: Secondary | ICD-10-CM | POA: Diagnosis not present

## 2019-10-30 DIAGNOSIS — F419 Anxiety disorder, unspecified: Secondary | ICD-10-CM | POA: Diagnosis not present

## 2019-10-30 DIAGNOSIS — Z8673 Personal history of transient ischemic attack (TIA), and cerebral infarction without residual deficits: Secondary | ICD-10-CM | POA: Diagnosis not present

## 2019-10-30 DIAGNOSIS — M6281 Muscle weakness (generalized): Secondary | ICD-10-CM | POA: Diagnosis not present

## 2019-11-01 DIAGNOSIS — U071 COVID-19: Secondary | ICD-10-CM | POA: Diagnosis not present

## 2019-11-01 DIAGNOSIS — F0150 Vascular dementia without behavioral disturbance: Secondary | ICD-10-CM | POA: Diagnosis not present

## 2019-11-01 DIAGNOSIS — R278 Other lack of coordination: Secondary | ICD-10-CM | POA: Diagnosis not present

## 2019-11-01 DIAGNOSIS — F419 Anxiety disorder, unspecified: Secondary | ICD-10-CM | POA: Diagnosis not present

## 2019-11-01 DIAGNOSIS — M6281 Muscle weakness (generalized): Secondary | ICD-10-CM | POA: Diagnosis not present

## 2019-11-01 DIAGNOSIS — Z8673 Personal history of transient ischemic attack (TIA), and cerebral infarction without residual deficits: Secondary | ICD-10-CM | POA: Diagnosis not present

## 2019-11-02 DIAGNOSIS — Z8673 Personal history of transient ischemic attack (TIA), and cerebral infarction without residual deficits: Secondary | ICD-10-CM | POA: Diagnosis not present

## 2019-11-02 DIAGNOSIS — U071 COVID-19: Secondary | ICD-10-CM | POA: Diagnosis not present

## 2019-11-02 DIAGNOSIS — R278 Other lack of coordination: Secondary | ICD-10-CM | POA: Diagnosis not present

## 2019-11-02 DIAGNOSIS — F419 Anxiety disorder, unspecified: Secondary | ICD-10-CM | POA: Diagnosis not present

## 2019-11-02 DIAGNOSIS — F0150 Vascular dementia without behavioral disturbance: Secondary | ICD-10-CM | POA: Diagnosis not present

## 2019-11-02 DIAGNOSIS — M6281 Muscle weakness (generalized): Secondary | ICD-10-CM | POA: Diagnosis not present

## 2019-11-03 DIAGNOSIS — F419 Anxiety disorder, unspecified: Secondary | ICD-10-CM | POA: Diagnosis not present

## 2019-11-03 DIAGNOSIS — U071 COVID-19: Secondary | ICD-10-CM | POA: Diagnosis not present

## 2019-11-03 DIAGNOSIS — R278 Other lack of coordination: Secondary | ICD-10-CM | POA: Diagnosis not present

## 2019-11-03 DIAGNOSIS — F0150 Vascular dementia without behavioral disturbance: Secondary | ICD-10-CM | POA: Diagnosis not present

## 2019-11-03 DIAGNOSIS — Z8673 Personal history of transient ischemic attack (TIA), and cerebral infarction without residual deficits: Secondary | ICD-10-CM | POA: Diagnosis not present

## 2019-11-03 DIAGNOSIS — M6281 Muscle weakness (generalized): Secondary | ICD-10-CM | POA: Diagnosis not present

## 2019-11-06 DIAGNOSIS — Z8673 Personal history of transient ischemic attack (TIA), and cerebral infarction without residual deficits: Secondary | ICD-10-CM | POA: Diagnosis not present

## 2019-11-06 DIAGNOSIS — U071 COVID-19: Secondary | ICD-10-CM | POA: Diagnosis not present

## 2019-11-06 DIAGNOSIS — F0150 Vascular dementia without behavioral disturbance: Secondary | ICD-10-CM | POA: Diagnosis not present

## 2019-11-06 DIAGNOSIS — R278 Other lack of coordination: Secondary | ICD-10-CM | POA: Diagnosis not present

## 2019-11-06 DIAGNOSIS — F419 Anxiety disorder, unspecified: Secondary | ICD-10-CM | POA: Diagnosis not present

## 2019-11-06 DIAGNOSIS — M6281 Muscle weakness (generalized): Secondary | ICD-10-CM | POA: Diagnosis not present

## 2019-11-07 DIAGNOSIS — F0150 Vascular dementia without behavioral disturbance: Secondary | ICD-10-CM | POA: Diagnosis not present

## 2019-11-07 DIAGNOSIS — R278 Other lack of coordination: Secondary | ICD-10-CM | POA: Diagnosis not present

## 2019-11-07 DIAGNOSIS — F419 Anxiety disorder, unspecified: Secondary | ICD-10-CM | POA: Diagnosis not present

## 2019-11-07 DIAGNOSIS — Z8673 Personal history of transient ischemic attack (TIA), and cerebral infarction without residual deficits: Secondary | ICD-10-CM | POA: Diagnosis not present

## 2019-11-07 DIAGNOSIS — U071 COVID-19: Secondary | ICD-10-CM | POA: Diagnosis not present

## 2019-11-07 DIAGNOSIS — M6281 Muscle weakness (generalized): Secondary | ICD-10-CM | POA: Diagnosis not present

## 2019-11-08 DIAGNOSIS — M6281 Muscle weakness (generalized): Secondary | ICD-10-CM | POA: Diagnosis not present

## 2019-11-08 DIAGNOSIS — F419 Anxiety disorder, unspecified: Secondary | ICD-10-CM | POA: Diagnosis not present

## 2019-11-08 DIAGNOSIS — F0150 Vascular dementia without behavioral disturbance: Secondary | ICD-10-CM | POA: Diagnosis not present

## 2019-11-08 DIAGNOSIS — Z8673 Personal history of transient ischemic attack (TIA), and cerebral infarction without residual deficits: Secondary | ICD-10-CM | POA: Diagnosis not present

## 2019-11-08 DIAGNOSIS — R278 Other lack of coordination: Secondary | ICD-10-CM | POA: Diagnosis not present

## 2019-11-08 DIAGNOSIS — U071 COVID-19: Secondary | ICD-10-CM | POA: Diagnosis not present

## 2019-11-15 DIAGNOSIS — H538 Other visual disturbances: Secondary | ICD-10-CM | POA: Diagnosis not present

## 2019-11-30 DIAGNOSIS — I10 Essential (primary) hypertension: Secondary | ICD-10-CM

## 2019-11-30 DIAGNOSIS — F0150 Vascular dementia without behavioral disturbance: Secondary | ICD-10-CM

## 2019-11-30 DIAGNOSIS — K219 Gastro-esophageal reflux disease without esophagitis: Secondary | ICD-10-CM

## 2019-11-30 DIAGNOSIS — F321 Major depressive disorder, single episode, moderate: Secondary | ICD-10-CM

## 2019-11-30 DIAGNOSIS — I480 Paroxysmal atrial fibrillation: Secondary | ICD-10-CM

## 2020-01-08 DIAGNOSIS — H539 Unspecified visual disturbance: Secondary | ICD-10-CM | POA: Diagnosis not present

## 2020-01-11 ENCOUNTER — Telehealth: Payer: Self-pay | Admitting: Licensed Clinical Social Worker

## 2020-01-11 NOTE — Telephone Encounter (Signed)
Palliative Care SW left a vm at [facility] to obtain information regarding patient.  Also left a vm for patient's son, Cecilie Lowers.

## 2020-01-22 DIAGNOSIS — Z23 Encounter for immunization: Secondary | ICD-10-CM | POA: Diagnosis not present

## 2020-01-25 DIAGNOSIS — D320 Benign neoplasm of cerebral meninges: Secondary | ICD-10-CM

## 2020-01-25 DIAGNOSIS — F0150 Vascular dementia without behavioral disturbance: Secondary | ICD-10-CM | POA: Diagnosis not present

## 2020-01-25 DIAGNOSIS — F321 Major depressive disorder, single episode, moderate: Secondary | ICD-10-CM | POA: Diagnosis not present

## 2020-01-25 DIAGNOSIS — D696 Thrombocytopenia, unspecified: Secondary | ICD-10-CM

## 2020-01-25 DIAGNOSIS — I10 Essential (primary) hypertension: Secondary | ICD-10-CM | POA: Diagnosis not present

## 2020-01-25 DIAGNOSIS — I480 Paroxysmal atrial fibrillation: Secondary | ICD-10-CM | POA: Diagnosis not present

## 2020-01-25 DIAGNOSIS — K219 Gastro-esophageal reflux disease without esophagitis: Secondary | ICD-10-CM

## 2020-01-31 DIAGNOSIS — R4189 Other symptoms and signs involving cognitive functions and awareness: Secondary | ICD-10-CM | POA: Diagnosis not present

## 2020-01-31 DIAGNOSIS — R278 Other lack of coordination: Secondary | ICD-10-CM | POA: Diagnosis not present

## 2020-01-31 DIAGNOSIS — F419 Anxiety disorder, unspecified: Secondary | ICD-10-CM | POA: Diagnosis not present

## 2020-01-31 DIAGNOSIS — U071 COVID-19: Secondary | ICD-10-CM | POA: Diagnosis not present

## 2020-01-31 DIAGNOSIS — Z741 Need for assistance with personal care: Secondary | ICD-10-CM | POA: Diagnosis not present

## 2020-01-31 DIAGNOSIS — R2681 Unsteadiness on feet: Secondary | ICD-10-CM | POA: Diagnosis not present

## 2020-01-31 DIAGNOSIS — F39 Unspecified mood [affective] disorder: Secondary | ICD-10-CM | POA: Diagnosis not present

## 2020-01-31 DIAGNOSIS — F0150 Vascular dementia without behavioral disturbance: Secondary | ICD-10-CM | POA: Diagnosis not present

## 2020-01-31 DIAGNOSIS — I6930 Unspecified sequelae of cerebral infarction: Secondary | ICD-10-CM | POA: Diagnosis not present

## 2020-01-31 DIAGNOSIS — Z8673 Personal history of transient ischemic attack (TIA), and cerebral infarction without residual deficits: Secondary | ICD-10-CM | POA: Diagnosis not present

## 2020-01-31 DIAGNOSIS — M6281 Muscle weakness (generalized): Secondary | ICD-10-CM | POA: Diagnosis not present

## 2020-01-31 DIAGNOSIS — I69319 Unspecified symptoms and signs involving cognitive functions following cerebral infarction: Secondary | ICD-10-CM | POA: Diagnosis not present

## 2020-01-31 DIAGNOSIS — I482 Chronic atrial fibrillation, unspecified: Secondary | ICD-10-CM | POA: Diagnosis not present

## 2020-02-01 DIAGNOSIS — I69319 Unspecified symptoms and signs involving cognitive functions following cerebral infarction: Secondary | ICD-10-CM | POA: Diagnosis not present

## 2020-02-01 DIAGNOSIS — Z8673 Personal history of transient ischemic attack (TIA), and cerebral infarction without residual deficits: Secondary | ICD-10-CM | POA: Diagnosis not present

## 2020-02-01 DIAGNOSIS — Z741 Need for assistance with personal care: Secondary | ICD-10-CM | POA: Diagnosis not present

## 2020-02-01 DIAGNOSIS — U071 COVID-19: Secondary | ICD-10-CM | POA: Diagnosis not present

## 2020-02-01 DIAGNOSIS — R278 Other lack of coordination: Secondary | ICD-10-CM | POA: Diagnosis not present

## 2020-02-01 DIAGNOSIS — R2681 Unsteadiness on feet: Secondary | ICD-10-CM | POA: Diagnosis not present

## 2020-02-02 DIAGNOSIS — Z8673 Personal history of transient ischemic attack (TIA), and cerebral infarction without residual deficits: Secondary | ICD-10-CM | POA: Diagnosis not present

## 2020-02-02 DIAGNOSIS — R2681 Unsteadiness on feet: Secondary | ICD-10-CM | POA: Diagnosis not present

## 2020-02-02 DIAGNOSIS — U071 COVID-19: Secondary | ICD-10-CM | POA: Diagnosis not present

## 2020-02-02 DIAGNOSIS — Z741 Need for assistance with personal care: Secondary | ICD-10-CM | POA: Diagnosis not present

## 2020-02-02 DIAGNOSIS — I69319 Unspecified symptoms and signs involving cognitive functions following cerebral infarction: Secondary | ICD-10-CM | POA: Diagnosis not present

## 2020-02-02 DIAGNOSIS — R278 Other lack of coordination: Secondary | ICD-10-CM | POA: Diagnosis not present

## 2020-02-05 DIAGNOSIS — I69319 Unspecified symptoms and signs involving cognitive functions following cerebral infarction: Secondary | ICD-10-CM | POA: Diagnosis not present

## 2020-02-05 DIAGNOSIS — U071 COVID-19: Secondary | ICD-10-CM | POA: Diagnosis not present

## 2020-02-05 DIAGNOSIS — R278 Other lack of coordination: Secondary | ICD-10-CM | POA: Diagnosis not present

## 2020-02-05 DIAGNOSIS — Z741 Need for assistance with personal care: Secondary | ICD-10-CM | POA: Diagnosis not present

## 2020-02-05 DIAGNOSIS — R2681 Unsteadiness on feet: Secondary | ICD-10-CM | POA: Diagnosis not present

## 2020-02-05 DIAGNOSIS — Z8673 Personal history of transient ischemic attack (TIA), and cerebral infarction without residual deficits: Secondary | ICD-10-CM | POA: Diagnosis not present

## 2020-02-06 DIAGNOSIS — Z741 Need for assistance with personal care: Secondary | ICD-10-CM | POA: Diagnosis not present

## 2020-02-06 DIAGNOSIS — Z8673 Personal history of transient ischemic attack (TIA), and cerebral infarction without residual deficits: Secondary | ICD-10-CM | POA: Diagnosis not present

## 2020-02-06 DIAGNOSIS — R2681 Unsteadiness on feet: Secondary | ICD-10-CM | POA: Diagnosis not present

## 2020-02-06 DIAGNOSIS — I69319 Unspecified symptoms and signs involving cognitive functions following cerebral infarction: Secondary | ICD-10-CM | POA: Diagnosis not present

## 2020-02-06 DIAGNOSIS — R278 Other lack of coordination: Secondary | ICD-10-CM | POA: Diagnosis not present

## 2020-02-06 DIAGNOSIS — U071 COVID-19: Secondary | ICD-10-CM | POA: Diagnosis not present

## 2020-02-10 DIAGNOSIS — I69319 Unspecified symptoms and signs involving cognitive functions following cerebral infarction: Secondary | ICD-10-CM | POA: Diagnosis not present

## 2020-02-10 DIAGNOSIS — R278 Other lack of coordination: Secondary | ICD-10-CM | POA: Diagnosis not present

## 2020-02-10 DIAGNOSIS — R2681 Unsteadiness on feet: Secondary | ICD-10-CM | POA: Diagnosis not present

## 2020-02-10 DIAGNOSIS — Z741 Need for assistance with personal care: Secondary | ICD-10-CM | POA: Diagnosis not present

## 2020-02-10 DIAGNOSIS — Z8673 Personal history of transient ischemic attack (TIA), and cerebral infarction without residual deficits: Secondary | ICD-10-CM | POA: Diagnosis not present

## 2020-02-10 DIAGNOSIS — U071 COVID-19: Secondary | ICD-10-CM | POA: Diagnosis not present

## 2020-04-05 DIAGNOSIS — I10 Essential (primary) hypertension: Secondary | ICD-10-CM

## 2020-04-05 DIAGNOSIS — F0150 Vascular dementia without behavioral disturbance: Secondary | ICD-10-CM

## 2020-04-05 DIAGNOSIS — K219 Gastro-esophageal reflux disease without esophagitis: Secondary | ICD-10-CM

## 2020-04-05 DIAGNOSIS — D329 Benign neoplasm of meninges, unspecified: Secondary | ICD-10-CM

## 2020-04-05 DIAGNOSIS — I4891 Unspecified atrial fibrillation: Secondary | ICD-10-CM

## 2020-04-05 DIAGNOSIS — D696 Thrombocytopenia, unspecified: Secondary | ICD-10-CM

## 2020-04-05 DIAGNOSIS — F331 Major depressive disorder, recurrent, moderate: Secondary | ICD-10-CM

## 2020-04-10 DIAGNOSIS — M79671 Pain in right foot: Secondary | ICD-10-CM

## 2020-04-10 DIAGNOSIS — B351 Tinea unguium: Secondary | ICD-10-CM

## 2020-06-06 DIAGNOSIS — I10 Essential (primary) hypertension: Secondary | ICD-10-CM | POA: Diagnosis not present

## 2020-06-06 DIAGNOSIS — F0150 Vascular dementia without behavioral disturbance: Secondary | ICD-10-CM

## 2020-06-06 DIAGNOSIS — F324 Major depressive disorder, single episode, in partial remission: Secondary | ICD-10-CM | POA: Diagnosis not present

## 2020-06-06 DIAGNOSIS — K219 Gastro-esophageal reflux disease without esophagitis: Secondary | ICD-10-CM

## 2020-06-06 DIAGNOSIS — I480 Paroxysmal atrial fibrillation: Secondary | ICD-10-CM | POA: Diagnosis not present

## 2020-06-20 DIAGNOSIS — F3289 Other specified depressive episodes: Secondary | ICD-10-CM

## 2020-06-28 DIAGNOSIS — B351 Tinea unguium: Secondary | ICD-10-CM

## 2020-07-24 DIAGNOSIS — D320 Benign neoplasm of cerebral meninges: Secondary | ICD-10-CM

## 2020-07-24 DIAGNOSIS — F329 Major depressive disorder, single episode, unspecified: Secondary | ICD-10-CM

## 2020-07-24 DIAGNOSIS — I10 Essential (primary) hypertension: Secondary | ICD-10-CM | POA: Diagnosis not present

## 2020-07-24 DIAGNOSIS — I4891 Unspecified atrial fibrillation: Secondary | ICD-10-CM | POA: Diagnosis not present

## 2020-07-24 DIAGNOSIS — D696 Thrombocytopenia, unspecified: Secondary | ICD-10-CM

## 2020-07-24 DIAGNOSIS — F0150 Vascular dementia without behavioral disturbance: Secondary | ICD-10-CM | POA: Diagnosis not present

## 2020-07-24 DIAGNOSIS — K219 Gastro-esophageal reflux disease without esophagitis: Secondary | ICD-10-CM | POA: Diagnosis not present

## 2020-08-06 DIAGNOSIS — R42 Dizziness and giddiness: Secondary | ICD-10-CM | POA: Diagnosis not present

## 2020-08-20 DIAGNOSIS — R42 Dizziness and giddiness: Secondary | ICD-10-CM | POA: Diagnosis not present

## 2020-10-03 DIAGNOSIS — K219 Gastro-esophageal reflux disease without esophagitis: Secondary | ICD-10-CM

## 2020-10-03 DIAGNOSIS — I10 Essential (primary) hypertension: Secondary | ICD-10-CM | POA: Diagnosis not present

## 2020-10-03 DIAGNOSIS — F324 Major depressive disorder, single episode, in partial remission: Secondary | ICD-10-CM | POA: Diagnosis not present

## 2020-10-03 DIAGNOSIS — I480 Paroxysmal atrial fibrillation: Secondary | ICD-10-CM

## 2020-10-03 DIAGNOSIS — F0150 Vascular dementia without behavioral disturbance: Secondary | ICD-10-CM | POA: Diagnosis not present

## 2020-10-03 DIAGNOSIS — D75839 Thrombocytosis, unspecified: Secondary | ICD-10-CM

## 2020-11-27 DIAGNOSIS — I4891 Unspecified atrial fibrillation: Secondary | ICD-10-CM

## 2020-11-27 DIAGNOSIS — M545 Low back pain, unspecified: Secondary | ICD-10-CM | POA: Diagnosis not present

## 2020-11-27 DIAGNOSIS — F324 Major depressive disorder, single episode, in partial remission: Secondary | ICD-10-CM

## 2020-11-27 DIAGNOSIS — I10 Essential (primary) hypertension: Secondary | ICD-10-CM | POA: Diagnosis not present

## 2020-11-27 DIAGNOSIS — K219 Gastro-esophageal reflux disease without esophagitis: Secondary | ICD-10-CM | POA: Diagnosis not present

## 2020-11-27 DIAGNOSIS — D696 Thrombocytopenia, unspecified: Secondary | ICD-10-CM

## 2020-11-27 DIAGNOSIS — D329 Benign neoplasm of meninges, unspecified: Secondary | ICD-10-CM

## 2020-11-27 DIAGNOSIS — F0150 Vascular dementia without behavioral disturbance: Secondary | ICD-10-CM | POA: Diagnosis not present

## 2020-12-09 DIAGNOSIS — L821 Other seborrheic keratosis: Secondary | ICD-10-CM | POA: Diagnosis not present

## 2020-12-24 ENCOUNTER — Telehealth: Payer: Self-pay | Admitting: *Deleted

## 2020-12-24 NOTE — Telephone Encounter (Signed)
Shelly Padilla called to let Dr. Silvio Padilla know they received a letter from Shelly Padilla stating they would no longer cover Shelly Padilla.  Alternatives are Shelly Padilla ointment or Shelly Padilla.  Shelly Padilla I did not see Shelly Padilla on Shelly Padilla's current medication but I would send message to Dr. Silvio Padilla to see if he needs to prescribe an alternative.  Patient resides as Shelly Padilla.

## 2020-12-25 NOTE — Telephone Encounter (Signed)
Please forward this message to [place] can handle it (she is in [facility])

## 2020-12-26 NOTE — Telephone Encounter (Signed)
Faxed this note to [facility] [employer]

## 2021-02-03 DIAGNOSIS — D329 Benign neoplasm of meninges, unspecified: Secondary | ICD-10-CM

## 2021-02-03 DIAGNOSIS — F0150 Vascular dementia without behavioral disturbance: Secondary | ICD-10-CM

## 2021-02-03 DIAGNOSIS — D473 Essential (hemorrhagic) thrombocythemia: Secondary | ICD-10-CM

## 2021-02-03 DIAGNOSIS — F324 Major depressive disorder, single episode, in partial remission: Secondary | ICD-10-CM

## 2021-02-03 DIAGNOSIS — I480 Paroxysmal atrial fibrillation: Secondary | ICD-10-CM | POA: Diagnosis not present

## 2021-02-24 DIAGNOSIS — R10813 Right lower quadrant abdominal tenderness: Secondary | ICD-10-CM | POA: Diagnosis not present

## 2021-03-12 DIAGNOSIS — B351 Tinea unguium: Secondary | ICD-10-CM | POA: Diagnosis not present

## 2021-03-28 DIAGNOSIS — D75839 Thrombocytosis, unspecified: Secondary | ICD-10-CM

## 2021-03-28 DIAGNOSIS — D329 Benign neoplasm of meninges, unspecified: Secondary | ICD-10-CM

## 2021-03-28 DIAGNOSIS — F329 Major depressive disorder, single episode, unspecified: Secondary | ICD-10-CM | POA: Diagnosis not present

## 2021-03-28 DIAGNOSIS — F0150 Vascular dementia without behavioral disturbance: Secondary | ICD-10-CM

## 2021-03-28 DIAGNOSIS — I4891 Unspecified atrial fibrillation: Secondary | ICD-10-CM | POA: Diagnosis not present

## 2021-03-28 DIAGNOSIS — I10 Essential (primary) hypertension: Secondary | ICD-10-CM | POA: Diagnosis not present

## 2021-03-28 DIAGNOSIS — M545 Low back pain, unspecified: Secondary | ICD-10-CM

## 2021-04-03 DIAGNOSIS — L570 Actinic keratosis: Secondary | ICD-10-CM | POA: Diagnosis not present

## 2021-04-21 ENCOUNTER — Emergency Department: Payer: Medicare Other

## 2021-04-21 ENCOUNTER — Inpatient Hospital Stay
Admission: EM | Admit: 2021-04-21 | Discharge: 2021-04-29 | DRG: 956 | Disposition: A | Discharge status: Skilled Nursing Facility | Payer: Medicare Other | Source: Skilled Nursing Facility | Attending: Hospitalist | Admitting: Hospitalist

## 2021-04-21 DIAGNOSIS — F32A Depression, unspecified: Secondary | ICD-10-CM | POA: Diagnosis present

## 2021-04-21 DIAGNOSIS — E785 Hyperlipidemia, unspecified: Secondary | ICD-10-CM | POA: Diagnosis present

## 2021-04-21 DIAGNOSIS — G936 Cerebral edema: Secondary | ICD-10-CM | POA: Diagnosis present

## 2021-04-21 DIAGNOSIS — R443 Hallucinations, unspecified: Secondary | ICD-10-CM | POA: Diagnosis not present

## 2021-04-21 DIAGNOSIS — Z96649 Presence of unspecified artificial hip joint: Secondary | ICD-10-CM

## 2021-04-21 DIAGNOSIS — J90 Pleural effusion, not elsewhere classified: Secondary | ICD-10-CM | POA: Diagnosis present

## 2021-04-21 DIAGNOSIS — Z8249 Family history of ischemic heart disease and other diseases of the circulatory system: Secondary | ICD-10-CM

## 2021-04-21 DIAGNOSIS — S72001A Fracture of unspecified part of neck of right femur, initial encounter for closed fracture: Secondary | ICD-10-CM | POA: Diagnosis not present

## 2021-04-21 DIAGNOSIS — I609 Nontraumatic subarachnoid hemorrhage, unspecified: Secondary | ICD-10-CM | POA: Diagnosis not present

## 2021-04-21 DIAGNOSIS — I452 Bifascicular block: Secondary | ICD-10-CM | POA: Diagnosis present

## 2021-04-21 DIAGNOSIS — Z8673 Personal history of transient ischemic attack (TIA), and cerebral infarction without residual deficits: Secondary | ICD-10-CM | POA: Diagnosis not present

## 2021-04-21 DIAGNOSIS — F0390 Unspecified dementia without behavioral disturbance: Secondary | ICD-10-CM | POA: Diagnosis present

## 2021-04-21 DIAGNOSIS — Z515 Encounter for palliative care: Secondary | ICD-10-CM

## 2021-04-21 DIAGNOSIS — W19XXXA Unspecified fall, initial encounter: Secondary | ICD-10-CM

## 2021-04-21 DIAGNOSIS — S0101XA Laceration without foreign body of scalp, initial encounter: Secondary | ICD-10-CM | POA: Diagnosis present

## 2021-04-21 DIAGNOSIS — S066X9A Traumatic subarachnoid hemorrhage with loss of consciousness of unspecified duration, initial encounter: Secondary | ICD-10-CM | POA: Diagnosis present

## 2021-04-21 DIAGNOSIS — Z79899 Other long term (current) drug therapy: Secondary | ICD-10-CM

## 2021-04-21 DIAGNOSIS — R809 Proteinuria, unspecified: Secondary | ICD-10-CM | POA: Diagnosis present

## 2021-04-21 DIAGNOSIS — G4700 Insomnia, unspecified: Secondary | ICD-10-CM | POA: Diagnosis present

## 2021-04-21 DIAGNOSIS — S72011A Unspecified intracapsular fracture of right femur, initial encounter for closed fracture: Principal | ICD-10-CM | POA: Diagnosis present

## 2021-04-21 DIAGNOSIS — E440 Moderate protein-calorie malnutrition: Secondary | ICD-10-CM | POA: Diagnosis present

## 2021-04-21 DIAGNOSIS — M25551 Pain in right hip: Secondary | ICD-10-CM | POA: Diagnosis present

## 2021-04-21 DIAGNOSIS — Y92129 Unspecified place in nursing home as the place of occurrence of the external cause: Secondary | ICD-10-CM

## 2021-04-21 DIAGNOSIS — D75839 Thrombocytosis, unspecified: Secondary | ICD-10-CM | POA: Diagnosis present

## 2021-04-21 DIAGNOSIS — J811 Chronic pulmonary edema: Secondary | ICD-10-CM | POA: Diagnosis not present

## 2021-04-21 DIAGNOSIS — I480 Paroxysmal atrial fibrillation: Secondary | ICD-10-CM | POA: Diagnosis present

## 2021-04-21 DIAGNOSIS — Z7901 Long term (current) use of anticoagulants: Secondary | ICD-10-CM

## 2021-04-21 DIAGNOSIS — Z888 Allergy status to other drugs, medicaments and biological substances status: Secondary | ICD-10-CM

## 2021-04-21 DIAGNOSIS — Z6822 Body mass index (BMI) 22.0-22.9, adult: Secondary | ICD-10-CM

## 2021-04-21 DIAGNOSIS — R1310 Dysphagia, unspecified: Secondary | ICD-10-CM | POA: Diagnosis not present

## 2021-04-21 DIAGNOSIS — Z01810 Encounter for preprocedural cardiovascular examination: Secondary | ICD-10-CM

## 2021-04-21 DIAGNOSIS — S0990XA Unspecified injury of head, initial encounter: Secondary | ICD-10-CM

## 2021-04-21 DIAGNOSIS — Z882 Allergy status to sulfonamides status: Secondary | ICD-10-CM

## 2021-04-21 DIAGNOSIS — Z885 Allergy status to narcotic agent status: Secondary | ICD-10-CM

## 2021-04-21 DIAGNOSIS — G459 Transient cerebral ischemic attack, unspecified: Secondary | ICD-10-CM | POA: Diagnosis not present

## 2021-04-21 DIAGNOSIS — J9601 Acute respiratory failure with hypoxia: Secondary | ICD-10-CM | POA: Diagnosis not present

## 2021-04-21 DIAGNOSIS — R0902 Hypoxemia: Secondary | ICD-10-CM

## 2021-04-21 DIAGNOSIS — Z66 Do not resuscitate: Secondary | ICD-10-CM | POA: Diagnosis present

## 2021-04-21 DIAGNOSIS — Z9842 Cataract extraction status, left eye: Secondary | ICD-10-CM

## 2021-04-21 DIAGNOSIS — I482 Chronic atrial fibrillation, unspecified: Secondary | ICD-10-CM

## 2021-04-21 DIAGNOSIS — Z7189 Other specified counseling: Secondary | ICD-10-CM | POA: Diagnosis not present

## 2021-04-21 DIAGNOSIS — F05 Delirium due to known physiological condition: Secondary | ICD-10-CM | POA: Diagnosis not present

## 2021-04-21 DIAGNOSIS — D72829 Elevated white blood cell count, unspecified: Secondary | ICD-10-CM | POA: Diagnosis not present

## 2021-04-21 DIAGNOSIS — K219 Gastro-esophageal reflux disease without esophagitis: Secondary | ICD-10-CM | POA: Diagnosis present

## 2021-04-21 DIAGNOSIS — Z86011 Personal history of benign neoplasm of the brain: Secondary | ICD-10-CM

## 2021-04-21 DIAGNOSIS — I471 Supraventricular tachycardia: Secondary | ICD-10-CM | POA: Diagnosis not present

## 2021-04-21 DIAGNOSIS — I10 Essential (primary) hypertension: Secondary | ICD-10-CM | POA: Diagnosis present

## 2021-04-21 DIAGNOSIS — Z20822 Contact with and (suspected) exposure to covid-19: Secondary | ICD-10-CM | POA: Diagnosis present

## 2021-04-21 HISTORY — DX: Paroxysmal atrial fibrillation: I48.0

## 2021-04-21 HISTORY — DX: Personal history of other medical treatment: Z92.89

## 2021-04-21 LAB — URINALYSIS, COMPLETE (UACMP) WITH MICROSCOPIC
Bacteria, UA: NONE SEEN
Bilirubin Urine: NEGATIVE
Glucose, UA: NEGATIVE mg/dL
Hgb urine dipstick: NEGATIVE
Ketones, ur: NEGATIVE mg/dL
Leukocytes,Ua: NEGATIVE
Nitrite: NEGATIVE
Protein, ur: 30 mg/dL — AB
Specific Gravity, Urine: 1.014 (ref 1.005–1.030)
Squamous Epithelial / HPF: NONE SEEN (ref 0–5)
pH: 7 (ref 5.0–8.0)

## 2021-04-21 LAB — BASIC METABOLIC PANEL
Anion gap: 10 (ref 5–15)
BUN: 16 mg/dL (ref 8–23)
CO2: 28 mmol/L (ref 22–32)
Calcium: 8.9 mg/dL (ref 8.9–10.3)
Chloride: 96 mmol/L — ABNORMAL LOW (ref 98–111)
Creatinine, Ser: 0.38 mg/dL — ABNORMAL LOW (ref 0.44–1.00)
GFR, Estimated: >60 mL/min (ref >60)
Glucose, Bld: 138 mg/dL — ABNORMAL HIGH (ref 70–99)
Potassium: 3.6 mmol/L (ref 3.5–5.1)
Sodium: 134 mmol/L — ABNORMAL LOW (ref 135–145)

## 2021-04-21 LAB — TYPE AND SCREEN
ABO/RH(D): B POS
Antibody Screen: NEGATIVE

## 2021-04-21 LAB — PROTIME-INR
INR: 1.1 (ref 0.8–1.2)
Prothrombin Time: 13.8 seconds (ref 11.4–15.2)

## 2021-04-21 LAB — CBC WITH DIFFERENTIAL/PLATELET
Abs Immature Granulocytes: 0.28 10*3/uL — ABNORMAL HIGH (ref 0.00–0.07)
Basophils Absolute: 0.1 10*3/uL (ref 0.0–0.1)
Basophils Relative: 0 %
Eosinophils Absolute: 0.1 10*3/uL (ref 0.0–0.5)
Eosinophils Relative: 0 %
HCT: 46 % (ref 36.0–46.0)
Hemoglobin: 15.2 g/dL — ABNORMAL HIGH (ref 12.0–15.0)
Immature Granulocytes: 2 %
Lymphocytes Relative: 5 %
Lymphs Abs: 0.9 10*3/uL (ref 0.7–4.0)
MCH: 30.1 pg (ref 26.0–34.0)
MCHC: 33 g/dL (ref 30.0–36.0)
MCV: 91.1 fL (ref 80.0–100.0)
Monocytes Absolute: 1.3 10*3/uL — ABNORMAL HIGH (ref 0.1–1.0)
Monocytes Relative: 8 %
Neutro Abs: 14.6 10*3/uL — ABNORMAL HIGH (ref 1.7–7.7)
Neutrophils Relative %: 85 %
Platelets: 833 10*3/uL — ABNORMAL HIGH (ref 150–400)
RBC: 5.05 MIL/uL (ref 3.87–5.11)
RDW: 12.1 % (ref 11.5–15.5)
WBC: 17.3 10*3/uL — ABNORMAL HIGH (ref 4.0–10.5)
nRBC: 0 % (ref 0.0–0.2)

## 2021-04-21 LAB — RESP PANEL BY RT-PCR (FLU A&B, COVID) ARPGX2
Influenza A by PCR: NEGATIVE
Influenza B by PCR: NEGATIVE
SARS Coronavirus 2 by RT PCR: NEGATIVE

## 2021-04-21 LAB — APTT: aPTT: 40 seconds — ABNORMAL HIGH (ref 24–36)

## 2021-04-21 MED ORDER — OXYCODONE-ACETAMINOPHEN 5-325 MG PO TABS
1.0000 | ORAL_TABLET | ORAL | Status: DC | PRN
  Administered 2021-04-22: 1 via ORAL
  Filled 2021-04-21: qty 1

## 2021-04-21 MED ORDER — SENNOSIDES-DOCUSATE SODIUM 8.6-50 MG PO TABS: 1.0000 | ORAL_TABLET | Freq: Every evening | ORAL | Status: DC | PRN

## 2021-04-21 MED ORDER — ATORVASTATIN CALCIUM 10 MG PO TABS
10.0000 mg | ORAL_TABLET | Freq: Every day | ORAL | Status: DC
  Administered 2021-04-23 – 2021-04-27 (×4): 10 mg via ORAL
  Filled 2021-04-21 (×6): qty 1

## 2021-04-21 MED ORDER — CEFAZOLIN SODIUM-DEXTROSE 2-4 GM/100ML-% IV SOLN
2.0000 g | INTRAVENOUS | Status: AC
  Administered 2021-04-22: 2 g via INTRAVENOUS

## 2021-04-21 MED ORDER — ESCITALOPRAM OXALATE 10 MG PO TABS
5.0000 mg | ORAL_TABLET | ORAL | Status: DC
  Administered 2021-04-23 – 2021-04-25 (×2): 5 mg via ORAL
  Filled 2021-04-21 (×4): qty 0.5

## 2021-04-21 MED ORDER — METHOCARBAMOL 500 MG PO TABS
500.0000 mg | ORAL_TABLET | Freq: Three times a day (TID) | ORAL | Status: DC | PRN
Start: 2021-04-21 — End: 2021-04-29
  Filled 2021-04-21: qty 1

## 2021-04-21 MED ORDER — DILTIAZEM HCL ER COATED BEADS 120 MG PO CP24
120.0000 mg | ORAL_CAPSULE | Freq: Every day | ORAL | Status: DC
  Administered 2021-04-23 – 2021-04-27 (×4): 120 mg via ORAL
  Filled 2021-04-21 (×9): qty 1

## 2021-04-21 MED ORDER — ACETAMINOPHEN 325 MG PO TABS: 650.0000 mg | ORAL_TABLET | Freq: Four times a day (QID) | ORAL | Status: DC | PRN

## 2021-04-21 MED ORDER — SODIUM CHLORIDE 0.9 % IV SOLN
12.5000 mg | Freq: Three times a day (TID) | INTRAVENOUS | Status: DC | PRN
  Administered 2021-04-21: 12.5 mg via INTRAVENOUS
  Filled 2021-04-21: qty 12.5

## 2021-04-21 MED ORDER — SODIUM CHLORIDE 0.9 % IV SOLN: INTRAVENOUS | Status: DC

## 2021-04-21 MED ORDER — POLYETHYLENE GLYCOL 3350 17 G PO PACK
17.0000 g | PACK | Freq: Every day | ORAL | Status: DC
  Administered 2021-04-23 – 2021-04-27 (×4): 17 g via ORAL
  Filled 2021-04-21 (×5): qty 1

## 2021-04-21 MED ORDER — RAMIPRIL 10 MG PO CAPS
10.0000 mg | ORAL_CAPSULE | Freq: Every day | ORAL | Status: DC
  Administered 2021-04-23 – 2021-04-29 (×5): 10 mg via ORAL
  Filled 2021-04-21 (×8): qty 1

## 2021-04-21 MED ORDER — METOPROLOL SUCCINATE ER 25 MG PO TB24
25.0000 mg | ORAL_TABLET | Freq: Every day | ORAL | Status: DC
  Administered 2021-04-22 – 2021-04-27 (×5): 25 mg via ORAL
  Filled 2021-04-21 (×7): qty 1

## 2021-04-21 MED ORDER — ADULT MULTIVITAMIN W/MINERALS CH
1.0000 | ORAL_TABLET | Freq: Every day | ORAL | Status: DC
  Administered 2021-04-23 – 2021-04-27 (×4): 1 via ORAL
  Filled 2021-04-21 (×5): qty 1

## 2021-04-21 MED ORDER — MORPHINE SULFATE (PF) 2 MG/ML IV SOLN
0.5000 mg | INTRAVENOUS | Status: DC | PRN
  Administered 2021-04-21 – 2021-04-23 (×4): 0.5 mg via INTRAVENOUS
  Filled 2021-04-21 (×4): qty 1

## 2021-04-21 MED ORDER — ONDANSETRON HCL 4 MG/2ML IJ SOLN
4.0000 mg | INTRAMUSCULAR | Status: AC
  Administered 2021-04-21: 4 mg via INTRAVENOUS
  Filled 2021-04-21: qty 2

## 2021-04-21 MED ORDER — MEMANTINE HCL 5 MG PO TABS
5.0000 mg | ORAL_TABLET | Freq: Two times a day (BID) | ORAL | Status: DC
  Administered 2021-04-23 – 2021-04-29 (×10): 5 mg via ORAL
  Filled 2021-04-21 (×12): qty 1

## 2021-04-21 MED ORDER — NYSTATIN 100000 UNIT/GM EX OINT
1.0000 "application " | TOPICAL_OINTMENT | Freq: Two times a day (BID) | CUTANEOUS | Status: DC
  Administered 2021-04-21 – 2021-04-29 (×15): 1 via TOPICAL
  Filled 2021-04-21 (×2): qty 15

## 2021-04-21 MED ORDER — HYDRALAZINE HCL 20 MG/ML IJ SOLN
5.0000 mg | INTRAMUSCULAR | Status: DC | PRN
  Administered 2021-04-23 – 2021-04-29 (×6): 5 mg via INTRAVENOUS
  Filled 2021-04-21 (×6): qty 1

## 2021-04-21 MED ORDER — MORPHINE SULFATE (PF) 4 MG/ML IV SOLN
4.0000 mg | Freq: Once | INTRAVENOUS | Status: AC
  Administered 2021-04-21: 4 mg via INTRAVENOUS
  Filled 2021-04-21: qty 1

## 2021-04-21 MED ORDER — SENNOSIDES-DOCUSATE SODIUM 8.6-50 MG PO TABS: 1.0000 | ORAL_TABLET | Freq: Every day | ORAL | Status: DC | PRN

## 2021-04-21 MED ORDER — TIMOLOL MALEATE 0.5 % OP SOLN
1.0000 [drp] | Freq: Every evening | OPHTHALMIC | Status: DC
  Administered 2021-04-23 – 2021-04-28 (×6): 1 [drp] via OPHTHALMIC
  Filled 2021-04-21: qty 5

## 2021-04-21 MED ORDER — ONDANSETRON HCL 4 MG/2ML IJ SOLN
4.0000 mg | Freq: Three times a day (TID) | INTRAMUSCULAR | Status: DC | PRN
  Administered 2021-04-21 – 2021-04-22 (×2): 4 mg via INTRAVENOUS
  Filled 2021-04-21 (×2): qty 2

## 2021-04-21 MED ORDER — ESCITALOPRAM OXALATE 10 MG PO TABS
10.0000 mg | ORAL_TABLET | ORAL | Status: DC
  Administered 2021-04-24 – 2021-04-27 (×2): 10 mg via ORAL
  Filled 2021-04-21 (×6): qty 1

## 2021-04-21 MED ORDER — FAMOTIDINE 20 MG PO TABS
20.0000 mg | ORAL_TABLET | Freq: Every day | ORAL | Status: DC
  Administered 2021-04-21 – 2021-04-27 (×6): 20 mg via ORAL
  Filled 2021-04-21 (×6): qty 1

## 2021-04-21 NOTE — H&P (Signed)
History and Physical    Shelly Padilla VZC:[id number] DOB: 1920/01/07 DOA: 04/21/2021  Referring MD/NP/PA:   PCP: Shelly Schwalbe, MD   Patient coming from:  The patient is coming from SNF.  At baseline, pt is dependent for most of ADL.        Chief Complaint: fall, right hip pain  HPI: Shelly Padilla is a 87 y.o. female with medical history significant of HTN, HLD, A fib on Eliquis, TIA, stroke, dementia, thrombocytosis, brain meningioma, depression, GERD, duodenitis, who presents with fall and right hip pain.  Pt has hx of dementia, her mental status was reportedly at baseline, but still cannot provide accurate medical history, therefore, most of the history is obtained by discussing the case with ED physician, per EMS report, and with the nursing staff.  History is limited.  Per report, patient had unwitnessed fall in facility.  She complains of lower back pain and right hip pain.  Denies chest pain or abdominal pain.  No active respiratory distress, cough, nausea, vomiting, diarrhea noted.  No facial droop or slurred speech.  Denies symptoms of UTI.  ED Course: pt was found to have WBC 17.5, INR 1.1, pending COVID-19 PCR, electrolytes renal function okay, pending urinalysis, temperature normal, blood pressure 2 4/106, 144/57, heart rate 87, RR 18, oxygen saturation 94% on room air. X-ray of right hip showed acute, displaced subcapital right femoral neck fracture. Chest x-ray negative.  CT of C-spine is negative for acute issues.  CT of head showed questionable SAH.  X-ray of the T-spine and L-spine negative for acute fracture. Patient is admitted to MedSurg bed as inpatient.  Dr. Adriana Simas of neurosurgery, Dr. Mariah Milling of cardiology and Dr. Joice Lofts of Ortho are consulted.  Review of Systems: Could not be reviewed accurately due to dementia  Allergy:  Allergies  Allergen Reactions  . Amoxil [Amoxicillin] Anxiety  . Codeine   . Pacerone  [Amiodarone Hcl] Nausea And Vomiting  . Sulfa Drugs  Cross Reactors   . Amlodipine Other (See Comments)    Mild edema    Past Medical History:  Diagnosis Date  . Atrial fibrillation (HCC)   . Brain tumor (HCC)    meningioma  . Glaucoma   . H/O paroxysmal supraventricular tachycardia    documented by Holter Monitor  . Hemorrhoids   . Hiatal hernia   . History of ovarian cyst   . HTN (hypertension)   . Insomnia   . Palpitations   . Thrombocytosis     Past Surgical History:  Procedure Laterality Date  . CATARACT EXTRACTION     left eye  . Leg vein stripping  1970  . TONSILLECTOMY  1929    Social History:  reports that she has never smoked. She has never used smokeless tobacco. She reports current alcohol use. She reports that she does not use drugs.  Family History:  Family History  Problem Relation Age of Onset  . Heart attack Father   . Heart failure Brother      Prior to Admission medications   Medication Sig Start Date End Date Taking? Authorizing Provider  acetaminophen (TYLENOL) 500 MG tablet Take 1,000 mg by mouth 3 (three) times daily.     [provider]  apixaban (ELIQUIS) 2.5 MG TABS tablet Take 2.5 mg by mouth 2 (two) times daily.    [provider]  aspirin EC 81 MG EC tablet Take 1 tablet (81 mg total) by mouth daily. Patient not taking: Reported on 09/02/2019 11/30/18  Nicholes Mango, MD  atorvastatin (LIPITOR) 10 MG tablet Take 1 tablet (10 mg total) by mouth daily. 11/30/18 11/30/19  Nicholes Mango, MD  famotidine (PEPCID) 20 MG tablet Take 1 tablet (20 mg total) by mouth 2 (two) times daily. Patient taking differently: Take 20 mg by mouth at bedtime.  07/04/18   Paulette Blanch, MD  memantine (NAMENDA) 5 MG tablet Take 5 mg by mouth 2 (two) times daily.    [provider]  metoprolol succinate (TOPROL-XL) 50 MG 24 hr tablet Take 1 tablet (50 mg total) by mouth daily with supper. Take with or immediately following a meal. Patient not taking: Reported on 09/02/2019 09/28/17 09/02/19   Minna Merritts, MD  metoprolol tartrate (LOPRESSOR) 25 MG tablet Take 25 mg by mouth 2 (two) times daily.    [provider]  Multiple Vitamins-Minerals (CENTRUM SILVER PO) Take 1 tablet by mouth daily.     [provider]  polyethylene glycol (MIRALAX / GLYCOLAX) packet Take 17 g by mouth daily.     [provider]  ramipril (ALTACE) 10 MG capsule TAKE ONE CAPSULE BY MOUTH ONCE DAILY Patient taking differently: Take 10 mg by mouth daily.  10/07/16   Minna Merritts, MD  sennosides-docusate sodium (SENOKOT-S) 8.6-50 MG tablet Take 1 tablet by mouth daily as needed for constipation.     [provider]  timolol (TIMOPTIC) 0.5 % ophthalmic solution Place 1 drop into both eyes every evening.     [provider]  traMADol (ULTRAM) 50 MG tablet Take 50 mg by mouth every 6 (six) hours as needed for moderate pain.    [provider]    Physical Exam: Vitals:   04/21/21 0913 04/21/21 1005 04/21/21 1030 04/21/21 1130  BP: (!) 144/57 110/76 99/84 (!) 128/57  Pulse: 87 90 91   Resp: 17  (!) 26 18  Temp:      TempSrc:      SpO2: 94% 96% 100%    General: Not in acute distress HEENT:       Eyes: PERRL, EOMI, no scleral icterus.       ENT: No discharge from the ears and nose, no pharynx injection, no tonsillar enlargement.        Neck: No JVD, no bruit, no mass felt. Heme: No neck lymph node enlargement. Cardiac: S1/S2, irregularly irregular rhythm, No murmurs, No gallops or rubs. Respiratory: No rales, wheezing, rhonchi or rubs. GI: Soft, nondistended, nontender, no rebound pain, no organomegaly, BS present. GU: No hematuria Ext: 1+ pitting leg edema bilaterally. 1+DP/PT pulse bilaterally. Musculoskeletal: Has tenderness in the right hip Skin: No rashes.  Neuro: Alert, knows her own name, not orientated to time and place, cranial nerves II-XII grossly intact, moves all extremities  Psych: Patient is not psychotic, no suicidal or  hemocidal ideation.  Labs on Admission: I have personally reviewed following labs and imaging studies  CBC: Recent Labs  Lab 04/21/21 0637  WBC 17.3*  NEUTROABS 14.6*  HGB 15.2*  HCT 46.0  MCV 91.1  PLT 751*   Basic Metabolic Panel: Recent Labs  Lab 04/21/21 0637  NA 134*  K 3.6  CL 96*  CO2 28  GLUCOSE 138*  BUN 16  CREATININE 0.38*  CALCIUM 8.9   GFR: CrCl cannot be calculated (Unknown ideal weight.). Liver Function Tests: No results for input(s): AST, ALT, ALKPHOS, BILITOT, PROT, ALBUMIN in the last 168 hours. No results for input(s): LIPASE, AMYLASE in the last 168 hours. No results  for input(s): AMMONIA in the last 168 hours. Coagulation Profile: Recent Labs  Lab 04/21/21 0637  INR 1.1   Cardiac Enzymes: No results for input(s): CKTOTAL, CKMB, CKMBINDEX, TROPONINI in the last 168 hours. BNP (last 3 results) No results for input(s): PROBNP in the last 8760 hours. HbA1C: No results for input(s): HGBA1C in the last 72 hours. CBG: No results for input(s): GLUCAP in the last 168 hours. Lipid Profile: No results for input(s): CHOL, HDL, LDLCALC, TRIG, CHOLHDL, LDLDIRECT in the last 72 hours. Thyroid Function Tests: No results for input(s): TSH, T4TOTAL, FREET4, T3FREE, THYROIDAB in the last 72 hours. Anemia Panel: No results for input(s): VITAMINB12, FOLATE, FERRITIN, TIBC, IRON, RETICCTPCT in the last 72 hours. Urine analysis:    Component Value Date/Time   COLORURINE YELLOW (A) 04/21/2021 0709   APPEARANCEUR CLEAR (A) 04/21/2021 0709   APPEARANCEUR Clear 08/02/2014 1525   LABSPEC 1.014 04/21/2021 0709   LABSPEC 1.006 08/02/2014 1525   PHURINE 7.0 04/21/2021 0709   GLUCOSEU NEGATIVE 04/21/2021 0709   GLUCOSEU Negative 08/02/2014 1525   HGBUR NEGATIVE 04/21/2021 0709   BILIRUBINUR NEGATIVE 04/21/2021 0709   BILIRUBINUR negative 01/30/2015 1602   BILIRUBINUR Negative 08/02/2014 1525   KETONESUR NEGATIVE 04/21/2021 0709   PROTEINUR 30 (A) 04/21/2021  0709   UROBILINOGEN 0.2 01/30/2015 1602   NITRITE NEGATIVE 04/21/2021 0709   LEUKOCYTESUR NEGATIVE 04/21/2021 0709   LEUKOCYTESUR 1+ 08/02/2014 1525   Sepsis Labs: @LABRCNTIP (procalcitonin:4,lacticidven:4) )No results found for this or any previous visit (from the past 240 hour(s)).   Radiological Exams on Admission: DG Chest 1 View  Result Date: 04/21/2021 CLINICAL DATA:  Pain after fall. EXAM: CHEST  1 VIEW COMPARISON:  Prior chest radiographs 11/27/2018 and earlier. FINDINGS: Heart size within normal limits. No appreciable airspace consolidation. No evidence of pleural effusion or pneumothorax. No acute bony abnormality identified. IMPRESSION: No evidence of active cardiopulmonary disease. Electronically Signed   By: Kellie Simmering DO   On: 04/21/2021 07:53   DG Thoracic Spine 2 View  Result Date: 04/21/2021 CLINICAL DATA:  Pain after fall. EXAM: THORACIC SPINE 2 VIEWS COMPARISON:  Prior chest radiographs 11/27/2018 and earlier. FINDINGS: Mild thoracic dextrocurvature. No significant spondylolisthesis. No appreciable thoracic vertebral compression fracture. Thoracic spondylosis with advanced multilevel disc space narrowing and multilevel ventrolateral osteophytes. IMPRESSION: No appreciable thoracic vertebral compression fracture. Mild thoracic dextrocurvature. Advanced thoracic spondylosis. Electronically Signed   By: Kellie Simmering DO   On: 04/21/2021 07:55   DG Lumbar Spine 2-3 Views  Result Date: 04/21/2021 CLINICAL DATA:  Pain after fall. EXAM: LUMBAR SPINE - 2-3 VIEW COMPARISON:  CT abdomen/pelvis 11/27/2018. FINDINGS: Five lumbar vertebrae. Mild lumbar levocurvature.  Trace L2-L3 grade 1 retrolisthesis. No lumbar vertebral compression fracture. Advanced lumbar spondylosis with multilevel disc space narrowing, anterior and posterior osteophytes and multilevel facet arthrosis IMPRESSION: No lumbar vertebral compression fracture. Mild lumbar levocurvature. Trace L2-L3 grade 1 retrolisthesis.  Advanced lumbar spondylosis. Electronically Signed   By: Kellie Simmering DO   On: 04/21/2021 07:57   CT HEAD WO CONTRAST  Result Date: 04/21/2021 CLINICAL DATA:  Head trauma, minor. Neck trauma. Additional history provided: Unwitnessed fall, patient reports low back pain, right hip pain, small laceration noted to back of scalp. History of atrial fibrillation on Eliquis. EXAM: CT HEAD WITHOUT CONTRAST CT CERVICAL SPINE WITHOUT CONTRAST TECHNIQUE: Multidetector CT imaging of the head and cervical spine was performed following the standard protocol without intravenous contrast. Multiplanar CT image reconstructions of the cervical spine were also generated. COMPARISON:  Prior head CT 09/02/2019. Prior brain MRI 11/29/2018. Cervical spine CT 02/19/2016. FINDINGS: CT HEAD FINDINGS Brain: Mild-to-moderate generalized cerebral atrophy. Small volume acute subarachnoid hemorrhage overlying the right frontal lobe (series 3, image 19) (series 4, image 31) (series 3, image 20). Redemonstrated partially calcified extra-axial dural-based mass overlying the right temporal occipital lobes compatible with meningioma. The mass has increased in size as compared to the prior head CT of 09/02/2019, now measuring 3.5 x 2.7 cm in transaxial (previously 3.0 x 2.4 cm). As before, there is mass effect upon the underlying right temporal occipital lobes with partial effacement of the right lateral ventricle. Mild surrounding vasogenic edema. No midline shift. Chronic lacunar infarct within the right basal ganglia, new from the prior head CT of 09/02/2019). Stable background moderate chronic small vessel ischemic disease within the cerebral white matter. A known chronic lacunar infarct within the left cerebellar hemisphere was better appreciated on the prior brain MRI of 11/29/2018. Unchanged mild extra-axial CSF density prominence overlying the left cerebellar hemisphere. No demarcated cortical infarct. Vascular: Calcifications. Skull: Normal.  Negative for fracture or focal lesion. Sinuses/Orbits: Visualized orbits show no acute finding. Minimal bilateral ethmoid sinus mucosal thickening. Other: Trace left mastoid effusion. Right frontotemporal scalp soft tissue swelling CT CERVICAL SPINE FINDINGS Alignment: Straightening of the expected cervical lordosis. No significant spondylolisthesis. Skull base and vertebrae: The basion-dental and atlanto-dental intervals are maintained.No evidence of acute fracture to the cervical spine. Soft tissues and spinal canal: No prevertebral fluid or swelling. No visible canal hematoma. Disc levels: Cervical spondylosis with advanced multilevel disc space narrowing, disc bulges, endplate spurring, uncovertebral hypertrophy and facet arthrosis. No appreciable high-grade spinal canal stenosis. Multilevel bony neural foraminal narrowing. Upper chest: No consolidation within the imaged lung apices. No visible pneumothorax. Other: 2.4 cm left thyroid lobe nodule. Thyroid ultrasound follow-up is not acquired given the patient's advanced age. These results were called by telephone at the time of interpretation on 04/21/2021 at 8:22 am to provider Dr. Corky Downs, who verbally acknowledged these results. IMPRESSION: CT head: 1. Small volume acute subarachnoid hemorrhage overlying the right frontal lobe. 2. Right frontotemporal scalp soft tissue swelling. 3. A meningioma overlying the right temporal occipital lobes has increased in size as compared to the head CT of 09/02/2019, now measuring 3.5 x 2.7 cm in transaxial dimensions. As before, there is mass effect upon the underlying right temporal occipital lobes with partial effacement of the right lateral ventricle and mild surrounding vasogenic edema. 4. Chronic right basal ganglia lacunar infarct, new from the prior exam. 5. Otherwise stable non-contrast CT appearance of the brain with generalized cerebral atrophy and chronic small vessel ischemic disease with chronic lacunar infarcts,  as described. 6. Trace left mastoid effusion. CT cervical spine: 1. No evidence of acute fracture to the cervical spine. 2. Nonspecific straightening of the expected cervical lordosis. 3. Cervical spondylosis, as described Electronically Signed   By: Kellie Simmering DO   On: 04/21/2021 08:24   CT CERVICAL SPINE WO CONTRAST  Result Date: 04/21/2021 CLINICAL DATA:  Head trauma, minor. Neck trauma. Additional history provided: Unwitnessed fall, patient reports low back pain, right hip pain, small laceration noted to back of scalp. History of atrial fibrillation on Eliquis. EXAM: CT HEAD WITHOUT CONTRAST CT CERVICAL SPINE WITHOUT CONTRAST TECHNIQUE: Multidetector CT imaging of the head and cervical spine was performed following the standard protocol without intravenous contrast. Multiplanar CT image reconstructions of the cervical spine were also generated. COMPARISON:  Prior head CT 09/02/2019. Prior brain MRI 11/29/2018.  Cervical spine CT 02/19/2016. FINDINGS: CT HEAD FINDINGS Brain: Mild-to-moderate generalized cerebral atrophy. Small volume acute subarachnoid hemorrhage overlying the right frontal lobe (series 3, image 19) (series 4, image 31) (series 3, image 20). Redemonstrated partially calcified extra-axial dural-based mass overlying the right temporal occipital lobes compatible with meningioma. The mass has increased in size as compared to the prior head CT of 09/02/2019, now measuring 3.5 x 2.7 cm in transaxial (previously 3.0 x 2.4 cm). As before, there is mass effect upon the underlying right temporal occipital lobes with partial effacement of the right lateral ventricle. Mild surrounding vasogenic edema. No midline shift. Chronic lacunar infarct within the right basal ganglia, new from the prior head CT of 09/02/2019). Stable background moderate chronic small vessel ischemic disease within the cerebral white matter. A known chronic lacunar infarct within the left cerebellar hemisphere was better appreciated  on the prior brain MRI of 11/29/2018. Unchanged mild extra-axial CSF density prominence overlying the left cerebellar hemisphere. No demarcated cortical infarct. Vascular: Calcifications. Skull: Normal. Negative for fracture or focal lesion. Sinuses/Orbits: Visualized orbits show no acute finding. Minimal bilateral ethmoid sinus mucosal thickening. Other: Trace left mastoid effusion. Right frontotemporal scalp soft tissue swelling CT CERVICAL SPINE FINDINGS Alignment: Straightening of the expected cervical lordosis. No significant spondylolisthesis. Skull base and vertebrae: The basion-dental and atlanto-dental intervals are maintained.No evidence of acute fracture to the cervical spine. Soft tissues and spinal canal: No prevertebral fluid or swelling. No visible canal hematoma. Disc levels: Cervical spondylosis with advanced multilevel disc space narrowing, disc bulges, endplate spurring, uncovertebral hypertrophy and facet arthrosis. No appreciable high-grade spinal canal stenosis. Multilevel bony neural foraminal narrowing. Upper chest: No consolidation within the imaged lung apices. No visible pneumothorax. Other: 2.4 cm left thyroid lobe nodule. Thyroid ultrasound follow-up is not acquired given the patient's advanced age. These results were called by telephone at the time of interpretation on 04/21/2021 at 8:22 am to provider Dr. Corky Downs, who verbally acknowledged these results. IMPRESSION: CT head: 1. Small volume acute subarachnoid hemorrhage overlying the right frontal lobe. 2. Right frontotemporal scalp soft tissue swelling. 3. A meningioma overlying the right temporal occipital lobes has increased in size as compared to the head CT of 09/02/2019, now measuring 3.5 x 2.7 cm in transaxial dimensions. As before, there is mass effect upon the underlying right temporal occipital lobes with partial effacement of the right lateral ventricle and mild surrounding vasogenic edema. 4. Chronic right basal ganglia  lacunar infarct, new from the prior exam. 5. Otherwise stable non-contrast CT appearance of the brain with generalized cerebral atrophy and chronic small vessel ischemic disease with chronic lacunar infarcts, as described. 6. Trace left mastoid effusion. CT cervical spine: 1. No evidence of acute fracture to the cervical spine. 2. Nonspecific straightening of the expected cervical lordosis. 3. Cervical spondylosis, as described Electronically Signed   By: Kellie Simmering DO   On: 04/21/2021 08:24   DG Hip Unilat With Pelvis 2-3 Views Right  Result Date: 04/21/2021 CLINICAL DATA:  Pain after fall. EXAM: DG HIP (WITH OR WITHOUT PELVIS) 2-3V RIGHT COMPARISON:  Radiographs of the right hip 11/26/2018. FINDINGS: Acute, displaced subcapital right femoral neck fracture. No other acute fracture is identified. Bilateral femoroacetabular joint degenerative changes. IMPRESSION: Acute, displaced subcapital right femoral neck fracture. Electronically Signed   By: Kellie Simmering DO   On: 04/21/2021 08:05     EKG: I have personally reviewed.  Atrial fibrillation, bifascicular block, early R wave progression  Assessment/Plan Principal Problem:   Closed  right hip fracture (HCC) Active Problems:   HTN (hypertension)   Thrombocytosis   TIA (transient ischemic attack)   Atrial fibrillation, chronic ([place])   Fall   SAH (subarachnoid hemorrhage) (HCC)   Leukocytosis   GERD (gastroesophageal reflux disease)   HLD (hyperlipidemia)   Depression  Closed right hip fracture ([place]): X-ray of right hip showed acute, displaced subcapital right femoral neck fracture.Orthopedic surgeon, Dr. Roland Rack is consulted was consulted. Pt has high risk for surgery.  Dr. Rockey Situ of cardiology is consulted for presurgical clearance.  I talked to patient's son by phone, per her son, if risk is too high, he would prefer conservative treatment.  - will admit to Med-surg bed - Pain control: morphine prn and percocet - When necessary Zofran for  nausea - Robaxin for muscle spasm - type and cross - INR/PTT - PT/OT when able to (not ordered now)  HTN (hypertension) -As needed IV hydralazine -Metoprolol -Ramipril  Thrombocytosis: This is chronic issue.  Platelet 833 -Follow-up by CBC  TIA (transient ischemic attack) and history of stroke -Continue Lipitor -hold Eliquis   Atrial fibrillation, chronic (HCC) -Metoprolol -hold Eliquis  Fall -PT/OT when able to  Depression -Continue home medications  SAH (subarachnoid hemorrhage) [facility]): Neurosurgery is consulted.  Per Dr. Lacinda Axon, [facility] is likely a overcall by radiologist, it is ok for eliquis -Frequent neurochecks -repeat CT-head tomorrow morning  Leukocytosis: WBC 17.5, no fever.  No source of infection identified.  Likely reactive -f/u UA  GERD (gastroesophageal reflux disease) -Pepcid  Dementia: -[place]  HLD (hyperlipidemia) -Lipitor         DVT ppx: SCD Code Status: DNR (pt has DNR form from [facility]) Family Communication:  Yes, patient's son by phone Disposition Plan:  Anticipate discharge back to previous environment Consults called:   Dr. Lacinda Axon of neurosurgery, Dr. Rockey Situ of cardiology and Dr. Roland Rack of Ortho are consulted. Admission status and Level of care: Med-Surg:   as inpt      Status is: Inpatient  Remains inpatient appropriate because:Inpatient level of care appropriate due to severity of illness   Dispo: The patient is from: SNF              Anticipated d/c is to: SNF              [name]            Date of Service 04/21/2021    Ivor Costa [facility]   If 7PM-7AM, please contact night-coverage [email or web address] 04/21/2021, 12:03 PM

## 2021-04-21 NOTE — Consult Note (Signed)
Neurosurgery-New Consultation Evaluation 04/21/2021 Shelly Padilla [id number]  Identifying Statement: Shelly Padilla is a 87 y.o. female from [place] [address] with reported fall  Physician Requesting Consultation: [facility]  History of Present Illness: Shelly Padilla was brought to the emergency department after an unwitnessed fall.  Here she is complaining of right hip and low back pain.  She does not provide any other pertinent history.  She is on Eliquis for atrial fibrillation.  She currently is denying any headaches.  No family is available in the emergency department.  As part of the work-up, CT of the head did show concern for small amount of a right frontal subarachnoid hemorrhage.  There is a known right parietal meningioma.  Neurosurgery is consulted given the concern for hemorrhage.  Past Medical History:  Past Medical History:  Diagnosis Date  . Atrial fibrillation ([place])   . Brain tumor ([place])    meningioma  . Glaucoma   . H/O paroxysmal supraventricular tachycardia    documented by Holter Monitor  . Hemorrhoids   . Hiatal hernia   . History of ovarian cyst   . HTN (hypertension)   . Insomnia   . Palpitations   . Thrombocytosis     Social History: Social History   Socioeconomic History  . Marital status: Widowed    Spouse name: Not on file  . Number of children: 1  . Years of education: Not on file  . Highest education level: Not on file  Occupational History  . Occupation: retired    Comment: Network engineer  Tobacco Use  . Smoking status: Never Smoker  . Smokeless tobacco: Never Used  Substance and Sexual Activity  . Alcohol use: Yes    Alcohol/week: 0.0 standard drinks    Comment: occasionally wine  . Drug use: No  . Sexual activity: Not on file  Other Topics Concern  . Not on file  Social History Narrative   Primary care giver of husband with Alzheimer's--now in memory care      Has living will   Son is health care POA   Has DNR   No tube  feeds if cognitively unaware   Social Determinants of Health   Financial Resource Strain: Not on file  Food Insecurity: Not on file  Transportation Needs: Not on file  Physical Activity: Not on file  Stress: Not on file  Social Connections: Not on file  Intimate Partner Violence: Not on file   Family History: Family History  Problem Relation Age of Onset  . Heart attack Father   . Heart failure Brother     Review of Systems:  Review of Systems -unable to obtain a thorough ROS given patient's mentation.  Physical Exam: BP (!) 117/56   Pulse 92   Temp (!) 97.5 F (36.4 C) (Oral)   Resp 17   SpO2 97%  There is no height or weight on file to calculate BMI. There is no height or weight on file to calculate BSA. General appearance: Awake, cooperative, does not focus on questioning.   Head: Normocephalic, atraumatic Eyes: Normal, EOM intact Oropharynx: Appears dry  Neurologic exam:  Mental status: alertness: alert, orientation: Oriented to person, she would not state place.  Affect: normal Speech: Speech is clear but on attempting to test naming, patient did not fully cooperate. Facial strength appears symmetric Motor: She is moving all extremities to command Gait: Not tested  Laboratory: Results for orders placed or performed during the hospital encounter of 04/21/21  CBC WITH  DIFFERENTIAL  Result Value Ref Range   WBC 17.3 (H) 4.0 - 10.5 K/uL   RBC 5.05 3.87 - 5.11 MIL/uL   Hemoglobin 15.2 (H) 12.0 - 15.0 g/dL   HCT 46.0 36.0 - 46.0 %   MCV 91.1 80.0 - 100.0 fL   MCH 30.1 26.0 - 34.0 pg   MCHC 33.0 30.0 - 36.0 g/dL   RDW 12.1 11.5 - 15.5 %   Platelets 833 (H) 150 - 400 K/uL   nRBC 0.0 0.0 - 0.2 %   Neutrophils Relative % 85 %   Neutro Abs 14.6 (H) 1.7 - 7.7 K/uL   Lymphocytes Relative 5 %   Lymphs Abs 0.9 0.7 - 4.0 K/uL   Monocytes Relative 8 %   Monocytes Absolute 1.3 (H) 0.1 - 1.0 K/uL   Eosinophils Relative 0 %   Eosinophils Absolute 0.1 0.0 - 0.5 K/uL    Basophils Relative 0 %   Basophils Absolute 0.1 0.0 - 0.1 K/uL   Immature Granulocytes 2 %   Abs Immature Granulocytes 0.28 (H) 0.00 - 0.07 K/uL  Protime-INR  Result Value Ref Range   Prothrombin Time 13.8 11.4 - 15.2 seconds   INR 1.1 0.8 - 1.2  Basic metabolic panel  Result Value Ref Range   Sodium 134 (L) 135 - 145 mmol/L   Potassium 3.6 3.5 - 5.1 mmol/L   Chloride 96 (L) 98 - 111 mmol/L   CO2 28 22 - 32 mmol/L   Glucose, Bld 138 (H) 70 - 99 mg/dL   BUN 16 8 - 23 mg/dL   Creatinine, Ser 0.38 (L) 0.44 - 1.00 mg/dL   Calcium 8.9 8.9 - 10.3 mg/dL   GFR, Estimated >60 >60 mL/min   Anion gap 10 5 - 15  APTT  Result Value Ref Range   aPTT 40 (H) 24 - 36 seconds  Urinalysis, Complete w Microscopic  Result Value Ref Range   Color, Urine YELLOW (A) YELLOW   APPearance CLEAR (A) CLEAR   Specific Gravity, Urine 1.014 1.005 - 1.030   pH 7.0 5.0 - 8.0   Glucose, UA NEGATIVE NEGATIVE mg/dL   Hgb urine dipstick NEGATIVE NEGATIVE   Bilirubin Urine NEGATIVE NEGATIVE   Ketones, ur NEGATIVE NEGATIVE mg/dL   Protein, ur 30 (A) NEGATIVE mg/dL   Nitrite NEGATIVE NEGATIVE   Leukocytes,Ua NEGATIVE NEGATIVE   RBC / HPF 0-5 0 - 5 RBC/hpf   WBC, UA 0-5 0 - 5 WBC/hpf   Bacteria, UA NONE SEEN NONE SEEN   Squamous Epithelial / LPF NONE SEEN 0 - 5   Mucus PRESENT    Hyaline Casts, UA PRESENT    I personally reviewed labs  Imaging: CT head:1. Small volume acute subarachnoid hemorrhage overlying the right frontal lobe. 2. Right frontotemporal scalp soft tissue swelling. 3. A meningioma overlying the right temporal occipital lobes has increased in size as compared to the head CT of 09/02/2019, now measuring 3.5 x 2.7 cm in transaxial dimensions. As before, there is mass effect upon the underlying right temporal occipital lobes with partial effacement of the right lateral ventricle and mild surrounding vasogenic edema. 4. Chronic right basal ganglia lacunar infarct, new from the prior  exam.  Impression/Plan:  Shelly Padilla is here after an unwitnessed fall with a hip fracture.  On the CT scan of the head there is concern for a small amount of acute subarachnoid hemorrhage in the right frontal lobe.  Portion of this appears to be a calcification and therefore there is  at most, minimal traumatic hemorrhage.  She does have the known right posterior meningioma.  Given this minimal amount, patient is cleared to continue with her Eliquis.  I do not see any need for any reversal agents.  She is cleared to proceed with surgery if that is desired for the hip fracture.  Can repeat a CT scan of the head in 24 hours to ensure stability.   1.  Diagnosis: Small traumatic SAH  2.  Plan -Repeat CT scan recommended for 24 hours after initial scan -No neurosurgical intervention needed at this time

## 2021-04-21 NOTE — ED Triage Notes (Signed)
Pt to ED via EMS from [facility] for unwitnessed fall. Pt c/o lower back pain and right hip pain. Small laceration noted to back of scalp. Hx a.fib, on eliquis. Per EMS/Staff at facility, pt at her baseline mentation with hx of dementia.

## 2021-04-21 NOTE — ED Notes (Signed)
Shelly Padilla is present at bedside, point of contact from facility, has taken care of patient for several years. Available via phone at [phone].

## 2021-04-21 NOTE — ED Provider Notes (Signed)
[facility] Emergency Department Provider Note   ____________________________________________    I have reviewed the triage vital signs and the nursing notes.   HISTORY  Chief Complaint Fall  History severely limited by dementia.   HPI Shelly Padilla is a 87 y.o. female with history of atrial fibrillation on Eliquis who presents after an unwitnessed fall.  Primary complaining of right hip pain and low back pain.  Is reportedly at her baseline mentation.  No further history available at this time  Past Medical History:  Diagnosis Date  . Atrial fibrillation ([place])   . Brain tumor ([place])    meningioma  . Glaucoma   . H/O paroxysmal supraventricular tachycardia    documented by Holter Monitor  . Hemorrhoids   . Hiatal hernia   . History of ovarian cyst   . HTN (hypertension)   . Insomnia   . Palpitations   . Thrombocytosis     Patient Active Problem List   Diagnosis Date Noted  . Acute CVA (cerebrovascular accident) ([place]) 04/27/2019  . Altered mental status 11/27/2018  . Atrial fibrillation, chronic ([place]) 11/27/2018  . AMS (altered mental status) 11/27/2018  . MDD (major depressive disorder), single episode, moderate ([place]) 09/08/2018  . Duodenitis 07/14/2018  . 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 Carotid artery disease ([place]) 03/03/2018  . TIA (transient ischemic attack) 11/26/2017  . Chronic venous insufficiency 06/09/2016  . Right inguinal hernia 06/09/2016  . DD (diverticular disease) 04/10/2016  . Glaucoma 04/10/2016  . Hemorrhoid 04/10/2016  . Bilateral ovarian cysts 03/18/2016  . Thrombocytosis   . Preventative health care 06/28/2015  . Varicose veins 01/31/2015  . Advance directive discussed with patient 12/26/2014  . Meningioma ([place]) 10/01/2014  . Insomnia 05/18/2014  . Episodic mood disorder ([place]) 10/18/2012  . Paroxysmal supraventricular tachycardia ([place]) 03/08/2012  . HTN (hypertension) 03/08/2012    Past Surgical History:   Procedure Laterality Date  . CATARACT EXTRACTION     left eye  . Leg vein stripping  1970  . TONSILLECTOMY  1929    Prior to Admission medications   Medication Sig Start Date End Date Taking? Authorizing Provider  acetaminophen (TYLENOL) 500 MG tablet Take 1,000 mg by mouth 3 (three) times daily.     [provider]  apixaban (ELIQUIS) 2.5 MG TABS tablet Take 2.5 mg by mouth 2 (two) times daily.    [provider]  aspirin EC 81 MG EC tablet Take 1 tablet (81 mg total) by mouth daily. Patient not taking: Reported on 09/02/2019 11/30/18   Nicholes Mango, MD  atorvastatin (LIPITOR) 10 MG tablet Take 1 tablet (10 mg total) by mouth daily. 11/30/18 11/30/19  Nicholes Mango, MD  famotidine (PEPCID) 20 MG tablet Take 1 tablet (20 mg total) by mouth 2 (two) times daily. Patient taking differently: Take 20 mg by mouth at bedtime.  07/04/18   Paulette Blanch, MD  memantine (NAMENDA) 5 MG tablet Take 5 mg by mouth 2 (two) times daily.    [provider]  metoprolol succinate (TOPROL-XL) 50 MG 24 hr tablet Take 1 tablet (50 mg total) by mouth daily with supper. Take with or immediately following a meal. Patient not taking: Reported on 09/02/2019 09/28/17 09/02/19  Minna Merritts, MD  metoprolol tartrate (LOPRESSOR) 25 MG tablet Take 25 mg by mouth 2 (two) times daily.    [provider]  Multiple Vitamins-Minerals (CENTRUM SILVER PO) Take 1 tablet by mouth daily.     [provider]  polyethylene glycol (MIRALAX / GLYCOLAX) packet Take 17 g by mouth daily.     [provider]  ramipril (ALTACE) 10 MG capsule TAKE ONE CAPSULE BY MOUTH ONCE DAILY Patient taking differently: Take 10 mg by mouth daily.  10/07/16   Minna Merritts, MD  sennosides-docusate sodium (SENOKOT-S) 8.6-50 MG tablet Take 1 tablet by mouth daily as needed for constipation.     [provider]  timolol (TIMOPTIC) 0.5 % ophthalmic solution Place 1 drop into both eyes every  evening.     [provider]  traMADol (ULTRAM) 50 MG tablet Take 50 mg by mouth every 6 (six) hours as needed for moderate pain.    [provider]     Allergies Amoxil [amoxicillin], Codeine, Pacerone  [amiodarone hcl], Sulfa drugs cross reactors, and Amlodipine  Family History  Problem Relation Age of Onset  . Heart attack Father   . Heart failure Brother     Social History Social History   Tobacco Use  . Smoking status: Never Smoker  . Smokeless tobacco: Never Used  Substance Use Topics  . Alcohol use: Yes    Alcohol/week: 0.0 standard drinks    Comment: occasionally wine  . Drug use: No    Review of Systems limited by dementia   Cardiovascular: Denies chest pain.  Gastrointestinal: No abdominal pain.      Musculoskeletal: Right hip pain, low back Skin: Small laceration to the skin    ____________________________________________   PHYSICAL EXAM:  VITAL SIGNS: ED Triage Vitals [04/21/21 0640]  Enc Vitals Group     BP (!) 204/106     Pulse Rate 96     Resp 12     Temp (!) 97.5 F (36.4 C)     Temp Source Oral     SpO2 95 %     Weight      Height      Head Circumference      Peak Flow      Pain Score      Pain Loc      Pain Edu?      Excl. in [place]?     Constitutional: Alert  Eyes: Conjunctivae are normal.  Head: Small laceration to the posterior scalp, bleeding Nose: No facial injury  Neck:  Painless ROM Cardiovascular: Normal rate.   Good peripheral circulation. Respiratory: Normal respiratory effort.  No retractions.  Gastrointestinal: Soft and nontender. No distention.    Musculoskeletal: Pain with axial load on the right hip, tenderness along the right lateral hip, no skin penetration.  Mild tenderness over the L3-L4 vertebrae, no bruising Neurologic:  Normal speech and language. No gross focal neurologic deficits are appreciated.  Skin:  Skin is warm, dry Psychiatric: Mood and affect are normal. Speech and behavior are  normal.  ____________________________________________   LABS (all labs ordered are listed, but only abnormal results are displayed)  Labs Reviewed  CBC WITH DIFFERENTIAL/PLATELET - Abnormal; Notable for the following components:      Result Value   WBC 17.3 (*)    Hemoglobin 15.2 (*)    Platelets 833 (*)    Neutro Abs 14.6 (*)    Monocytes Absolute 1.3 (*)    Abs Immature Granulocytes 0.28 (*)    All other components within normal limits  BASIC METABOLIC PANEL - Abnormal; Notable for the following components:   Sodium 134 (*)    Chloride 96 (*)    Glucose, Bld 138 (*)    Creatinine, Ser 0.38 (*)  All other components within normal limits  RESP PANEL BY RT-PCR (FLU A&B, COVID) ARPGX2  PROTIME-INR  TYPE AND SCREEN   ____________________________________________  EKG  ED ECG REPORT I, Lavonia Drafts, the attending physician, personally viewed and interpreted this ECG.  Date: 04/21/2021  Rhythm: normal sinus rhythm QRS Axis: normal Intervals: normal L bundle branch block ST/T Wave abnormalities: normal   ____________________________________________  RADIOLOGY  CT head reviewed by me, small subdural hematoma, counted by radiology ____________________________________________   PROCEDURES  Procedure(s) performed: No  Procedures   Critical Care performed: yes  CRITICAL CARE Performed by: Lavonia Drafts   Total critical care time 30  minutes  Critical care time was exclusive of separately billable procedures and treating other patients.  Critical care was necessary to treat or prevent imminent or life-threatening deterioration.  Critical care was time spent personally by me on the following activities: development of treatment plan with patient and/or surrogate as well as nursing, discussions with consultants, evaluation of patient's response to treatment, examination of patient, obtaining history from patient or surrogate, ordering and performing treatments  and interventions, ordering and review of laboratory studies, ordering and review of radiographic studies, pulse oximetry and re-evaluation of patient's condition.  ____________________________________________   INITIAL IMPRESSION / ASSESSMENT AND PLAN / ED COURSE  Pertinent labs & imaging results that were available during my care of the patient were reviewed by me and considered in my medical decision making (see chart for details).  Patient with dementia presents after mechanical fall.  On Eliquis for atrial fibrillation.  Evidence of minor head injury.  Will send for CT head cervical spine, complaining of low back pain as well, lumbar thoracic x-rays, right hip x-ray  Contacted by radiologist notified of small subdural hematoma, have paged neurosurgery to discuss.  Patient also with right hip fracture  Discussed with Dr. Lacinda Axon of neurosurgery who notes holding blood thinner and keeping patient here for hip surgery is reasonable, he suspects there is no subarachnoid  I will discuss with Dr. Roland Rack of orthopedics and consult the hospitalist service    ____________________________________________   FINAL CLINICAL IMPRESSION(S) / ED DIAGNOSES  Final diagnoses:  Fall, initial encounter  Injury of head, initial encounter  Closed fracture of right hip, initial encounter [facility])        Note:  This document was prepared using Dragon voice recognition software and may include unintentional dictation errors.   Lavonia Drafts, MD 04/21/21 [phone]

## 2021-04-21 NOTE — Consult Note (Signed)
Cardiology Consult    Patient ID: Shelly Padilla MRN: [id number], DOB/AGE: [birth year]   Admit date: 04/21/2021 Date of Consult: 04/21/2021  Primary Physician: Venia Carbon, MD Primary Cardiologist: Ida Rogue, MD Requesting Provider: Mora Bellman, MD  Patient Profile    Shelly Padilla is a 87 y.o. female with a history of PAF on eliquis, PSVT, HTN, and dementia, who is being seen today for preoperative cardiovascular risk evaluation at the request of Dr. Blaine Hamper.  Past Medical History   Past Medical History:  Diagnosis Date  . Brain tumor ([place])    meningioma  . Glaucoma   . H/O paroxysmal supraventricular tachycardia    documented by Holter Monitor  . Hemorrhoids   . Hiatal hernia   . History of echocardiogram    a. 11/2017 Echo: EF 60-65%, no rwma, triv AI.  Marland Kitchen History of ovarian cyst   . HTN (hypertension)   . Insomnia   . PAF (paroxysmal atrial fibrillation) ([facility])    a. CHA2DS2VASc = 4-->Eliquis.  . Palpitations   . Thrombocytosis     Past Surgical History:  Procedure Laterality Date  . CATARACT EXTRACTION     left eye  . Leg vein stripping  1970  . TONSILLECTOMY  1929     Allergies  Allergies  Allergen Reactions  . Amoxil [Amoxicillin] Anxiety  . Codeine   . Pacerone  [Amiodarone Hcl] Nausea And Vomiting  . Sulfa Drugs Cross Reactors   . Amlodipine Other (See Comments)    Mild edema    History of Present Illness    87 year old female with a prior history of paroxysmal atrial fibrillation on Eliquis, PSVT, hypertension, and dementia.  She lives at [facility].  Her son is located in [place], [place], and I have spoken with him today as well.  Patient has a history of dementia and has no recollection of why she was taken to the emergency department.  Her only complaint is of mild low back pain but otherwise, she notes that she feels well.  She is disoriented to time and place.  Notes indicate that patient was taken to the emergency department earlier  today with complaints of lower back pain and right hip pain following an unwitnessed fall.  She was also noted to have a small laceration on the back of her scalp.  Hip films show an acute displaced subcapital right femoral neck fracture.  CT imaging of the head and spine showed a small volume acute subarachnoid hemorrhage overlying the right frontal lobe with right frontotemporal scalp soft tissue swelling.  She has meningioma of the right temporal occipital lodes which has increased in size compared to 2 years ago.  There is mass-effect upon the underlying right temporal occipital lobes with partial effacement of the right lateral ventricle and mild surrounding vasogenic edema, which was present previously as well.  A chronic right basal ganglia lacunar infarct was noted, which was new since prior examination.  No acute fractures were noted.  He has been seen by neurosurgery with recommendation for follow-up CT imaging in 24 hours.  We have been asked to evaluate for preoperative evaluation related to probable hip surgery.  Patient denies any recent history of chest pain or dyspnea.  Home Medications      Prior to Admission medications   Medication Sig Start Date End Date Taking? Authorizing Provider  acetaminophen (TYLENOL) 500 MG tablet Take 1,000 mg by mouth 3 (three) times daily.   Yes [provider]  apixaban (ELIQUIS) 2.5  MG TABS tablet Take 2.5 mg by mouth 2 (two) times daily.   Yes [provider]  diltiazem (CARDIZEM CD) 120 MG 24 hr capsule Take 120 mg by mouth daily. 03/26/21  Yes [provider]  escitalopram (LEXAPRO) 10 MG tablet Take 10 mg by mouth. Take 10 mg by mouth every Monday, Wednesday, Friday, and Sunday in the evening 03/27/21  Yes [provider]  escitalopram (LEXAPRO) 5 MG tablet Take 5 mg by mouth. Take 5 mg by mouth every Tuesday, Thursday, and Saturday in the evening   Yes [provider]  famotidine (PEPCID) 20 MG tablet Take 1  tablet (20 mg total) by mouth 2 (two) times daily. Patient taking differently: Take 20 mg by mouth at bedtime. 07/04/18  Yes Paulette Blanch, MD  memantine (NAMENDA) 5 MG tablet Take 5 mg by mouth 2 (two) times daily.   Yes [provider]  metoprolol succinate (TOPROL-XL) 25 MG 24 hr tablet Take 25 mg by mouth daily.   Yes [provider]  Multiple Vitamins-Minerals (CENTRUM SILVER PO) Take 1 tablet by mouth daily.    Yes [provider]  nystatin ointment (MYCOSTATIN) Apply 1 application topically in the morning and at bedtime. Apply 1 application topically to the groin area every day and evening shift for yeast/rash/redness 01/09/21  Yes [provider]  polyethylene glycol (MIRALAX / GLYCOLAX) packet Take 17 g by mouth daily.    Yes [provider]  ramipril (ALTACE) 10 MG capsule TAKE ONE CAPSULE BY MOUTH ONCE DAILY Patient taking differently: Take 10 mg by mouth daily. 10/07/16  Yes Gollan, Kathlene November, MD  timolol (TIMOPTIC) 0.5 % ophthalmic solution Place 1 drop into both eyes every evening.    Yes [provider]  aspirin EC 81 MG EC tablet Take 1 tablet (81 mg total) by mouth daily. Patient not taking: No sig reported 11/30/18   Nicholes Mango, MD  atorvastatin (LIPITOR) 10 MG tablet Take 1 tablet (10 mg total) by mouth daily. 11/30/18 11/30/19  Nicholes Mango, MD  metoprolol tartrate (LOPRESSOR) 25 MG tablet Take 25 mg by mouth 2 (two) times daily. Patient not taking: Reported on 04/21/2021    [provider]  sennosides-docusate sodium (SENOKOT-S) 8.6-50 MG tablet Take 1 tablet by mouth daily as needed for constipation.     [provider]  traMADol (ULTRAM) 50 MG tablet Take 50 mg by mouth every 6 (six) hours as needed for moderate pain.    [provider]    Family History    Family History  Problem Relation Age of Onset  . Heart attack Father   . Heart failure Brother    She indicated that her mother is  deceased. She indicated that her father is deceased. She indicated that the status of her brother is unknown.   Social History    Social History   Socioeconomic History  . Marital status: Widowed    Spouse name: Not on file  . Number of children: 1  . Years of education: Not on file  . Highest education level: Not on file  Occupational History  . Occupation: retired    Comment: Network engineer  Tobacco Use  . Smoking status: Never Smoker  . Smokeless tobacco: Never Used  Substance and Sexual Activity  . Alcohol use: Yes    Alcohol/week: 0.0 standard drinks    Comment: occasionally wine  . Drug use: No  . Sexual activity: Not on file  Other Topics Concern  .  Not on file  Social History Narrative   Primary care giver of husband with Alzheimer's--now in memory care      Has living will   Son is health care POA   Has DNR   No tube feeds if cognitively unaware   Social Determinants of Health   Financial Resource Strain: Not on file  Food Insecurity: Not on file  Transportation Needs: Not on file  Physical Activity: Not on file  Stress: Not on file  Social Connections: Not on file  Intimate Partner Violence: Not on file     Review of Systems    General:  No chills, fever, night sweats or weight changes.  Cardiovascular:  No chest pain, dyspnea on exertion, edema, orthopnea, palpitations, paroxysmal nocturnal dyspnea. Dermatological: No rash, lesions/masses Respiratory: No cough, dyspnea Urologic: No hematuria, dysuria Abdominal:   No nausea, vomiting, diarrhea, bright red blood per rectum, melena, or hematemesis Neurologic:  No visual changes, wkns, changes in mental status. MSK:  +++ low back pain All other systems reviewed and are otherwise negative except as noted above.  Physical Exam    Blood pressure 134/84, pulse 100, temperature (!) 97.5 F (36.4 C), temperature source Oral, resp. rate 16, SpO2 99 %.  General: Pleasant, NAD Psych: Normal affect. Neuro:  Oriented to self. Moves all extremities spontaneously. HEENT: Normal  Neck: Supple without bruits or JVD. Lungs:  Resp regular and unlabored, CTA. Heart: RRR no s3, s4, or murmurs. Abdomen: Soft, non-tender, non-distended, BS + x 4.  Extremities: No clubbing, cyanosis or edema. DP/PT1+ and equal bilaterally.  Labs       Lab Results  Component Value Date   WBC 17.3 (H) 04/21/2021   HGB 15.2 (H) 04/21/2021   HCT 46.0 04/21/2021   MCV 91.1 04/21/2021   PLT 833 (H) 04/21/2021    Recent Labs  Lab 04/21/21 0637  NA 134*  K 3.6  CL 96*  CO2 28  BUN 16  CREATININE 0.38*  CALCIUM 8.9  GLUCOSE 138*   Lab Results  Component Value Date   CHOL 153 11/30/2018   HDL 44 11/30/2018   LDLCALC 87 11/30/2018   TRIG 109 11/30/2018     Radiology Studies    DG Chest 1 View  Result Date: 04/21/2021 CLINICAL DATA:  Pain after fall. EXAM: CHEST  1 VIEW COMPARISON:  Prior chest radiographs 11/27/2018 and earlier. FINDINGS: Heart size within normal limits. No appreciable airspace consolidation. No evidence of pleural effusion or pneumothorax. No acute bony abnormality identified. IMPRESSION: No evidence of active cardiopulmonary disease. Electronically Signed   By: Kellie Simmering DO   On: 04/21/2021 07:53   DG Thoracic Spine 2 View  Result Date: 04/21/2021 CLINICAL DATA:  Pain after fall. EXAM: THORACIC SPINE 2 VIEWS COMPARISON:  Prior chest radiographs 11/27/2018 and earlier. FINDINGS: Mild thoracic dextrocurvature. No significant spondylolisthesis. No appreciable thoracic vertebral compression fracture. Thoracic spondylosis with advanced multilevel disc space narrowing and multilevel ventrolateral osteophytes. IMPRESSION: No appreciable thoracic vertebral compression fracture. Mild thoracic dextrocurvature. Advanced thoracic spondylosis. Electronically Signed   By: Kellie Simmering DO   On: 04/21/2021 07:55   DG Lumbar Spine 2-3 Views  Result Date: 04/21/2021 CLINICAL DATA:  Pain after fall. EXAM:  LUMBAR SPINE - 2-3 VIEW COMPARISON:  CT abdomen/pelvis 11/27/2018. FINDINGS: Five lumbar vertebrae. Mild lumbar levocurvature.  Trace L2-L3 grade 1 retrolisthesis. No lumbar vertebral compression fracture. Advanced lumbar spondylosis with multilevel disc space narrowing, anterior and posterior osteophytes and multilevel facet arthrosis IMPRESSION: No lumbar  vertebral compression fracture. Mild lumbar levocurvature. Trace L2-L3 grade 1 retrolisthesis. Advanced lumbar spondylosis. Electronically Signed   By: Kellie Simmering DO   On: 04/21/2021 07:57   CT HEAD WO CONTRAST  Result Date: 04/21/2021 CLINICAL DATA:  Head trauma, minor. Neck trauma. Additional history provided: Unwitnessed fall, patient reports low back pain, right hip pain, small laceration noted to back of scalp. History of atrial fibrillation on Eliquis. EXAM: CT HEAD WITHOUT CONTRAST CT CERVICAL SPINE WITHOUT CONTRAST TECHNIQUE: Multidetector CT imaging of the head and cervical spine was performed following the standard protocol without intravenous contrast. Multiplanar CT image reconstructions of the cervical spine were also generated. COMPARISON:  Prior head CT 09/02/2019. Prior brain MRI 11/29/2018. Cervical spine CT 02/19/2016. FINDINGS: CT HEAD FINDINGS Brain: Mild-to-moderate generalized cerebral atrophy. Small volume acute subarachnoid hemorrhage overlying the right frontal lobe (series 3, image 19) (series 4, image 31) (series 3, image 20). Redemonstrated partially calcified extra-axial dural-based mass overlying the right temporal occipital lobes compatible with meningioma. The mass has increased in size as compared to the prior head CT of 09/02/2019, now measuring 3.5 x 2.7 cm in transaxial (previously 3.0 x 2.4 cm). As before, there is mass effect upon the underlying right temporal occipital lobes with partial effacement of the right lateral ventricle. Mild surrounding vasogenic edema. No midline shift. Chronic lacunar infarct within the  right basal ganglia, new from the prior head CT of 09/02/2019). Stable background moderate chronic small vessel ischemic disease within the cerebral white matter. A known chronic lacunar infarct within the left cerebellar hemisphere was better appreciated on the prior brain MRI of 11/29/2018. Unchanged mild extra-axial CSF density prominence overlying the left cerebellar hemisphere. No demarcated cortical infarct. Vascular: Calcifications. Skull: Normal. Negative for fracture or focal lesion. Sinuses/Orbits: Visualized orbits show no acute finding. Minimal bilateral ethmoid sinus mucosal thickening. Other: Trace left mastoid effusion. Right frontotemporal scalp soft tissue swelling CT CERVICAL SPINE FINDINGS Alignment: Straightening of the expected cervical lordosis. No significant spondylolisthesis. Skull base and vertebrae: The basion-dental and atlanto-dental intervals are maintained.No evidence of acute fracture to the cervical spine. Soft tissues and spinal canal: No prevertebral fluid or swelling. No visible canal hematoma. Disc levels: Cervical spondylosis with advanced multilevel disc space narrowing, disc bulges, endplate spurring, uncovertebral hypertrophy and facet arthrosis. No appreciable high-grade spinal canal stenosis. Multilevel bony neural foraminal narrowing. Upper chest: No consolidation within the imaged lung apices. No visible pneumothorax. Other: 2.4 cm left thyroid lobe nodule. Thyroid ultrasound follow-up is not acquired given the patient's advanced age. These results were called by telephone at the time of interpretation on 04/21/2021 at 8:22 am to provider Dr. Corky Downs, who verbally acknowledged these results. IMPRESSION: CT head: 1. Small volume acute subarachnoid hemorrhage overlying the right frontal lobe. 2. Right frontotemporal scalp soft tissue swelling. 3. A meningioma overlying the right temporal occipital lobes has increased in size as compared to the head CT of 09/02/2019, now  measuring 3.5 x 2.7 cm in transaxial dimensions. As before, there is mass effect upon the underlying right temporal occipital lobes with partial effacement of the right lateral ventricle and mild surrounding vasogenic edema. 4. Chronic right basal ganglia lacunar infarct, new from the prior exam. 5. Otherwise stable non-contrast CT appearance of the brain with generalized cerebral atrophy and chronic small vessel ischemic disease with chronic lacunar infarcts, as described. 6. Trace left mastoid effusion. CT cervical spine: 1. No evidence of acute fracture to the cervical spine. 2. Nonspecific straightening of the expected cervical lordosis.  3. Cervical spondylosis, as described Electronically Signed   By: Kellie Simmering DO   On: 04/21/2021 08:24   CT CERVICAL SPINE WO CONTRAST  Result Date: 04/21/2021 CLINICAL DATA:  Head trauma, minor. Neck trauma. Additional history provided: Unwitnessed fall, patient reports low back pain, right hip pain, small laceration noted to back of scalp. History of atrial fibrillation on Eliquis. EXAM: CT HEAD WITHOUT CONTRAST CT CERVICAL SPINE WITHOUT CONTRAST TECHNIQUE: Multidetector CT imaging of the head and cervical spine was performed following the standard protocol without intravenous contrast. Multiplanar CT image reconstructions of the cervical spine were also generated. COMPARISON:  Prior head CT 09/02/2019. Prior brain MRI 11/29/2018. Cervical spine CT 02/19/2016. FINDINGS: CT HEAD FINDINGS Brain: Mild-to-moderate generalized cerebral atrophy. Small volume acute subarachnoid hemorrhage overlying the right frontal lobe (series 3, image 19) (series 4, image 31) (series 3, image 20). Redemonstrated partially calcified extra-axial dural-based mass overlying the right temporal occipital lobes compatible with meningioma. The mass has increased in size as compared to the prior head CT of 09/02/2019, now measuring 3.5 x 2.7 cm in transaxial (previously 3.0 x 2.4 cm). As before, there  is mass effect upon the underlying right temporal occipital lobes with partial effacement of the right lateral ventricle. Mild surrounding vasogenic edema. No midline shift. Chronic lacunar infarct within the right basal ganglia, new from the prior head CT of 09/02/2019). Stable background moderate chronic small vessel ischemic disease within the cerebral white matter. A known chronic lacunar infarct within the left cerebellar hemisphere was better appreciated on the prior brain MRI of 11/29/2018. Unchanged mild extra-axial CSF density prominence overlying the left cerebellar hemisphere. No demarcated cortical infarct. Vascular: Calcifications. Skull: Normal. Negative for fracture or focal lesion. Sinuses/Orbits: Visualized orbits show no acute finding. Minimal bilateral ethmoid sinus mucosal thickening. Other: Trace left mastoid effusion. Right frontotemporal scalp soft tissue swelling CT CERVICAL SPINE FINDINGS Alignment: Straightening of the expected cervical lordosis. No significant spondylolisthesis. Skull base and vertebrae: The basion-dental and atlanto-dental intervals are maintained.No evidence of acute fracture to the cervical spine. Soft tissues and spinal canal: No prevertebral fluid or swelling. No visible canal hematoma. Disc levels: Cervical spondylosis with advanced multilevel disc space narrowing, disc bulges, endplate spurring, uncovertebral hypertrophy and facet arthrosis. No appreciable high-grade spinal canal stenosis. Multilevel bony neural foraminal narrowing. Upper chest: No consolidation within the imaged lung apices. No visible pneumothorax. Other: 2.4 cm left thyroid lobe nodule. Thyroid ultrasound follow-up is not acquired given the patient's advanced age. These results were called by telephone at the time of interpretation on 04/21/2021 at 8:22 am to provider Dr. Corky Downs, who verbally acknowledged these results. IMPRESSION: CT head: 1. Small volume acute subarachnoid hemorrhage overlying  the right frontal lobe. 2. Right frontotemporal scalp soft tissue swelling. 3. A meningioma overlying the right temporal occipital lobes has increased in size as compared to the head CT of 09/02/2019, now measuring 3.5 x 2.7 cm in transaxial dimensions. As before, there is mass effect upon the underlying right temporal occipital lobes with partial effacement of the right lateral ventricle and mild surrounding vasogenic edema. 4. Chronic right basal ganglia lacunar infarct, new from the prior exam. 5. Otherwise stable non-contrast CT appearance of the brain with generalized cerebral atrophy and chronic small vessel ischemic disease with chronic lacunar infarcts, as described. 6. Trace left mastoid effusion. CT cervical spine: 1. No evidence of acute fracture to the cervical spine. 2. Nonspecific straightening of the expected cervical lordosis. 3. Cervical spondylosis, as described Electronically Signed  By: Kellie Simmering DO   On: 04/21/2021 08:24   DG Hip Unilat With Pelvis 2-3 Views Right  Result Date: 04/21/2021 CLINICAL DATA:  Pain after fall. EXAM: DG HIP (WITH OR WITHOUT PELVIS) 2-3V RIGHT COMPARISON:  Radiographs of the right hip 11/26/2018. FINDINGS: Acute, displaced subcapital right femoral neck fracture. No other acute fracture is identified. Bilateral femoroacetabular joint degenerative changes. IMPRESSION: Acute, displaced subcapital right femoral neck fracture. Electronically Signed   By: Kellie Simmering DO   On: 04/21/2021 08:05    ECG & Cardiac Imaging    Regular sinus rhythm, 91, left axis deviation, PACs, right bundle branch block,?  Prior inferior infarct- personally reviewed.  Assessment & Plan    1.  Right femoral neck fracture/preoperative cardiovascular evaluation: Patient presented following unwitnessed fall with finding of right femoral neck fracture as well as a small subarachnoid hemorrhage noted on CT.  Patient is on chronic Eliquis therapy in the setting of paroxysmal atrial  fibrillation.  Patient has significant dementia and is unaware why she is in the emergency department.  She denies chest pain or dyspnea and at this time, only notes low back pain.  From a cardiac perspective, based on age alone, she is relatively high risk for complications related to surgery and anesthesia.  NSQIP risk calculates out to roughly 6-7%.  She will not require any additional ischemic testing prior to surgery as this is unlikely to change her outcome.  We will need to watch closely for postoperative arrhythmias given prior history of A. Fib.  Continue beta-blocker and statin therapy.  I have reached out to her son.  He seems to be in favor of proceeding with surgery if that is felt to be appropriate by the surgical team.  2.  Paroxysmal atrial fibrillation: In sinus rhythm on arrival.  Eliquis on hold in the setting of subarachnoid hemorrhage noted on CT and potential for orthopedic surgery.  Recommend continuation of beta-blocker throughout the perioperative period if appropriate.  3.  Subarachnoid hemorrhage: Noted in the setting of fall.  Seen by neurosurgery with plan for follow-up imaging in 24 hours.  Eliquis discontinued.  4.  Essential hypertension: Stable on beta-blocker and calcium channel blocker therapy.  Signed, Murray Hodgkins, NP 04/21/2021, 2:57 PM  For questions or updates, please contact   Please consult [email or web address] for contact info under Cardiology/STEMI.

## 2021-04-21 NOTE — ED Notes (Signed)
Spoke with patient's sister in law, Rondel Baton, phone [phone] regarding patient status and the need to get in touch with her son who is POA. Her son reportedly lives in [place], she will attempt to contact him as well.

## 2021-04-21 NOTE — ED Notes (Signed)
Assisted with fracture pan, warm blankets provided

## 2021-04-21 NOTE — Consult Note (Signed)
ORTHOPAEDIC CONSULTATION  REQUESTING PHYSICIAN: Ivor Costa, MD  Chief Complaint:   Right hip pain.  History of Present Illness: Shelly Padilla is a 87 y.o. female with multiple medical problems including hypertension, paroxysmal several atrial fibrillation, dementia, and a history of a brain tumor (meningioma) who lives in a rehab facility [facility]).  Apparently, the patient sustained an unwitnessed fall last night or early this morning.  When found on the ground, she complained of right hip and back pain.  She was brought to the emergency room where x-rays have demonstrated a valgus impacted right femoral neck fracture.  A head CT was performed which showed evidence of a 3.5 x 2.7 cm meningioma with mass-effect upon the underlying right temporal and occipital lobes.  There also is a "small volume acute subarachnoid hemorrhage overlying the right frontal lobe."  This was evaluated by the neurosurgeon who has recommended a follow-up CT scan in 24 hours.  The patient also has been seen by cardiology who feels that the patient is high risk for surgery, but that no further work-up or treatment is required to optimize the patient prior to any surgical intervention.  The patient is a poor historian and is unable to provide any details as to how she fell or whether there were any pre-existing symptoms which may have precipitated her fall.  Past Medical History:  Diagnosis Date  . Brain tumor ([place])    meningioma  . Glaucoma   . H/O paroxysmal supraventricular tachycardia    documented by Holter Monitor  . Hemorrhoids   . Hiatal hernia   . History of echocardiogram    a. 11/2017 Echo: EF 60-65%, no rwma, triv AI.  Marland Kitchen History of ovarian cyst   . HTN (hypertension)   . Insomnia   . PAF (paroxysmal atrial fibrillation) ([facility])    a. CHA2DS2VASc = 4-->Eliquis.  . Palpitations   . Thrombocytosis    Past Surgical History:  Procedure  Laterality Date  . CATARACT EXTRACTION     left eye  . Leg vein stripping  1970  . TONSILLECTOMY  1929   Social History   Socioeconomic History  . Marital status: Widowed    Spouse name: Not on file  . Number of children: 1  . Years of education: Not on file  . Highest education level: Not on file  Occupational History  . Occupation: retired    Comment: Network engineer  Tobacco Use  . Smoking status: Never Smoker  . Smokeless tobacco: Never Used  Substance and Sexual Activity  . Alcohol use: Yes    Alcohol/week: 0.0 standard drinks    Comment: occasionally wine  . Drug use: No  . Sexual activity: Not on file  Other Topics Concern  . Not on file  Social History Narrative   Primary care giver of husband with Alzheimer's--now in memory care      Has living will   Son is health care POA   Has DNR   No tube feeds if cognitively unaware   Social Determinants of Health   Financial Resource Strain: Not on file  Food Insecurity: Not on file  Transportation Needs: Not on file  Physical Activity: Not on file  Stress: Not on file  Social Connections: Not on file   Family History  Problem Relation Age of Onset  . Heart attack Father   . Heart failure Brother    Allergies  Allergen Reactions  . Amoxil [Amoxicillin] Anxiety  . Codeine   . Pacerone  [facility]  Hcl] Nausea And Vomiting  . Sulfa Drugs Cross Reactors   . Amlodipine Other (See Comments)    Mild edema   Prior to Admission medications   Medication Sig Start Date End Date Taking? Authorizing Provider  acetaminophen (TYLENOL) 500 MG tablet Take 1,000 mg by mouth 3 (three) times daily.   Yes [provider]  apixaban (ELIQUIS) 2.5 MG TABS tablet Take 2.5 mg by mouth 2 (two) times daily.   Yes [provider]  diltiazem (CARDIZEM CD) 120 MG 24 hr capsule Take 120 mg by mouth daily. 03/26/21  Yes [provider]  escitalopram (LEXAPRO) 10 MG tablet Take 10 mg by mouth. Take 10 mg by mouth  every Monday, Wednesday, Friday, and Sunday in the evening 03/27/21  Yes [provider]  escitalopram (LEXAPRO) 5 MG tablet Take 5 mg by mouth. Take 5 mg by mouth every Tuesday, Thursday, and Saturday in the evening   Yes [provider]  famotidine (PEPCID) 20 MG tablet Take 1 tablet (20 mg total) by mouth 2 (two) times daily. Patient taking differently: Take 20 mg by mouth at bedtime. 07/04/18  Yes Paulette Blanch, MD  memantine (NAMENDA) 5 MG tablet Take 5 mg by mouth 2 (two) times daily.   Yes [provider]  metoprolol succinate (TOPROL-XL) 25 MG 24 hr tablet Take 25 mg by mouth daily.   Yes [provider]  Multiple Vitamins-Minerals (CENTRUM SILVER PO) Take 1 tablet by mouth daily.    Yes [provider]  nystatin ointment (MYCOSTATIN) Apply 1 application topically in the morning and at bedtime. Apply 1 application topically to the groin area every day and evening shift for yeast/rash/redness 01/09/21  Yes [provider]  polyethylene glycol (MIRALAX / GLYCOLAX) packet Take 17 g by mouth daily.    Yes [provider]  ramipril (ALTACE) 10 MG capsule TAKE ONE CAPSULE BY MOUTH ONCE DAILY Patient taking differently: Take 10 mg by mouth daily. 10/07/16  Yes Gollan, Kathlene November, MD  timolol (TIMOPTIC) 0.5 % ophthalmic solution Place 1 drop into both eyes every evening.    Yes [provider]  aspirin EC 81 MG EC tablet Take 1 tablet (81 mg total) by mouth daily. Patient not taking: No sig reported 11/30/18   Nicholes Mango, MD  atorvastatin (LIPITOR) 10 MG tablet Take 1 tablet (10 mg total) by mouth daily. 11/30/18 11/30/19  Nicholes Mango, MD  metoprolol tartrate (LOPRESSOR) 25 MG tablet Take 25 mg by mouth 2 (two) times daily. Patient not taking: Reported on 04/21/2021    [provider]  sennosides-docusate sodium (SENOKOT-S) 8.6-50 MG tablet Take 1 tablet by mouth daily as needed for constipation.     [provider]  traMADol (ULTRAM) 50 MG tablet Take 50 mg by mouth every 6 (six) hours as needed for moderate pain.    [provider]   DG Chest 1 View  Result Date: 04/21/2021 CLINICAL DATA:  Pain after fall. EXAM: CHEST  1 VIEW COMPARISON:  Prior chest radiographs 11/27/2018 and earlier. FINDINGS: Heart size within normal limits. No appreciable airspace consolidation. No evidence of pleural effusion or pneumothorax. No acute bony abnormality identified. IMPRESSION: No evidence of active cardiopulmonary disease. Electronically Signed   By: Kellie Simmering DO   On: 04/21/2021 07:53   DG Thoracic Spine 2 View  Result Date: 04/21/2021 CLINICAL DATA:  Pain after fall. EXAM: THORACIC SPINE 2 VIEWS COMPARISON:  Prior chest radiographs 11/27/2018 and earlier. FINDINGS: Mild thoracic  dextrocurvature. No significant spondylolisthesis. No appreciable thoracic vertebral compression fracture. Thoracic spondylosis with advanced multilevel disc space narrowing and multilevel ventrolateral osteophytes. IMPRESSION: No appreciable thoracic vertebral compression fracture. Mild thoracic dextrocurvature. Advanced thoracic spondylosis. Electronically Signed   By: Kellie Simmering DO   On: 04/21/2021 07:55   DG Lumbar Spine 2-3 Views  Result Date: 04/21/2021 CLINICAL DATA:  Pain after fall. EXAM: LUMBAR SPINE - 2-3 VIEW COMPARISON:  CT abdomen/pelvis 11/27/2018. FINDINGS: Five lumbar vertebrae. Mild lumbar levocurvature.  Trace L2-L3 grade 1 retrolisthesis. No lumbar vertebral compression fracture. Advanced lumbar spondylosis with multilevel disc space narrowing, anterior and posterior osteophytes and multilevel facet arthrosis IMPRESSION: No lumbar vertebral compression fracture. Mild lumbar levocurvature. Trace L2-L3 grade 1 retrolisthesis. Advanced lumbar spondylosis. Electronically Signed   By: Kellie Simmering DO   On: 04/21/2021 07:57   CT HEAD WO CONTRAST  Result Date: 04/21/2021 CLINICAL DATA:  Head trauma, minor. Neck trauma.  Additional history provided: Unwitnessed fall, patient reports low back pain, right hip pain, small laceration noted to back of scalp. History of atrial fibrillation on Eliquis. EXAM: CT HEAD WITHOUT CONTRAST CT CERVICAL SPINE WITHOUT CONTRAST TECHNIQUE: Multidetector CT imaging of the head and cervical spine was performed following the standard protocol without intravenous contrast. Multiplanar CT image reconstructions of the cervical spine were also generated. COMPARISON:  Prior head CT 09/02/2019. Prior brain MRI 11/29/2018. Cervical spine CT 02/19/2016. FINDINGS: CT HEAD FINDINGS Brain: Mild-to-moderate generalized cerebral atrophy. Small volume acute subarachnoid hemorrhage overlying the right frontal lobe (series 3, image 19) (series 4, image 31) (series 3, image 20). Redemonstrated partially calcified extra-axial dural-based mass overlying the right temporal occipital lobes compatible with meningioma. The mass has increased in size as compared to the prior head CT of 09/02/2019, now measuring 3.5 x 2.7 cm in transaxial (previously 3.0 x 2.4 cm). As before, there is mass effect upon the underlying right temporal occipital lobes with partial effacement of the right lateral ventricle. Mild surrounding vasogenic edema. No midline shift. Chronic lacunar infarct within the right basal ganglia, new from the prior head CT of 09/02/2019). Stable background moderate chronic small vessel ischemic disease within the cerebral white matter. A known chronic lacunar infarct within the left cerebellar hemisphere was better appreciated on the prior brain MRI of 11/29/2018. Unchanged mild extra-axial CSF density prominence overlying the left cerebellar hemisphere. No demarcated cortical infarct. Vascular: Calcifications. Skull: Normal. Negative for fracture or focal lesion. Sinuses/Orbits: Visualized orbits show no acute finding. Minimal bilateral ethmoid sinus mucosal thickening. Other: Trace left mastoid effusion. Right  frontotemporal scalp soft tissue swelling CT CERVICAL SPINE FINDINGS Alignment: Straightening of the expected cervical lordosis. No significant spondylolisthesis. Skull base and vertebrae: The basion-dental and atlanto-dental intervals are maintained.No evidence of acute fracture to the cervical spine. Soft tissues and spinal canal: No prevertebral fluid or swelling. No visible canal hematoma. Disc levels: Cervical spondylosis with advanced multilevel disc space narrowing, disc bulges, endplate spurring, uncovertebral hypertrophy and facet arthrosis. No appreciable high-grade spinal canal stenosis. Multilevel bony neural foraminal narrowing. Upper chest: No consolidation within the imaged lung apices. No visible pneumothorax. Other: 2.4 cm left thyroid lobe nodule. Thyroid ultrasound follow-up is not acquired given the patient's advanced age. These results were called by telephone at the time of interpretation on 04/21/2021 at 8:22 am to provider Dr. Corky Downs, who verbally acknowledged these results. IMPRESSION: CT head: 1. Small volume acute subarachnoid hemorrhage overlying the right frontal lobe. 2. Right frontotemporal scalp soft tissue swelling. 3. A meningioma overlying the  right temporal occipital lobes has increased in size as compared to the head CT of 09/02/2019, now measuring 3.5 x 2.7 cm in transaxial dimensions. As before, there is mass effect upon the underlying right temporal occipital lobes with partial effacement of the right lateral ventricle and mild surrounding vasogenic edema. 4. Chronic right basal ganglia lacunar infarct, new from the prior exam. 5. Otherwise stable non-contrast CT appearance of the brain with generalized cerebral atrophy and chronic small vessel ischemic disease with chronic lacunar infarcts, as described. 6. Trace left mastoid effusion. CT cervical spine: 1. No evidence of acute fracture to the cervical spine. 2. Nonspecific straightening of the expected cervical lordosis. 3.  Cervical spondylosis, as described Electronically Signed   By: Kellie Simmering DO   On: 04/21/2021 08:24   CT CERVICAL SPINE WO CONTRAST  Result Date: 04/21/2021 CLINICAL DATA:  Head trauma, minor. Neck trauma. Additional history provided: Unwitnessed fall, patient reports low back pain, right hip pain, small laceration noted to back of scalp. History of atrial fibrillation on Eliquis. EXAM: CT HEAD WITHOUT CONTRAST CT CERVICAL SPINE WITHOUT CONTRAST TECHNIQUE: Multidetector CT imaging of the head and cervical spine was performed following the standard protocol without intravenous contrast. Multiplanar CT image reconstructions of the cervical spine were also generated. COMPARISON:  Prior head CT 09/02/2019. Prior brain MRI 11/29/2018. Cervical spine CT 02/19/2016. FINDINGS: CT HEAD FINDINGS Brain: Mild-to-moderate generalized cerebral atrophy. Small volume acute subarachnoid hemorrhage overlying the right frontal lobe (series 3, image 19) (series 4, image 31) (series 3, image 20). Redemonstrated partially calcified extra-axial dural-based mass overlying the right temporal occipital lobes compatible with meningioma. The mass has increased in size as compared to the prior head CT of 09/02/2019, now measuring 3.5 x 2.7 cm in transaxial (previously 3.0 x 2.4 cm). As before, there is mass effect upon the underlying right temporal occipital lobes with partial effacement of the right lateral ventricle. Mild surrounding vasogenic edema. No midline shift. Chronic lacunar infarct within the right basal ganglia, new from the prior head CT of 09/02/2019). Stable background moderate chronic small vessel ischemic disease within the cerebral white matter. A known chronic lacunar infarct within the left cerebellar hemisphere was better appreciated on the prior brain MRI of 11/29/2018. Unchanged mild extra-axial CSF density prominence overlying the left cerebellar hemisphere. No demarcated cortical infarct. Vascular: Calcifications.  Skull: Normal. Negative for fracture or focal lesion. Sinuses/Orbits: Visualized orbits show no acute finding. Minimal bilateral ethmoid sinus mucosal thickening. Other: Trace left mastoid effusion. Right frontotemporal scalp soft tissue swelling CT CERVICAL SPINE FINDINGS Alignment: Straightening of the expected cervical lordosis. No significant spondylolisthesis. Skull base and vertebrae: The basion-dental and atlanto-dental intervals are maintained.No evidence of acute fracture to the cervical spine. Soft tissues and spinal canal: No prevertebral fluid or swelling. No visible canal hematoma. Disc levels: Cervical spondylosis with advanced multilevel disc space narrowing, disc bulges, endplate spurring, uncovertebral hypertrophy and facet arthrosis. No appreciable high-grade spinal canal stenosis. Multilevel bony neural foraminal narrowing. Upper chest: No consolidation within the imaged lung apices. No visible pneumothorax. Other: 2.4 cm left thyroid lobe nodule. Thyroid ultrasound follow-up is not acquired given the patient's advanced age. These results were called by telephone at the time of interpretation on 04/21/2021 at 8:22 am to provider Dr. Corky Downs, who verbally acknowledged these results. IMPRESSION: CT head: 1. Small volume acute subarachnoid hemorrhage overlying the right frontal lobe. 2. Right frontotemporal scalp soft tissue swelling. 3. A meningioma overlying the right temporal occipital lobes has increased in size  as compared to the head CT of 09/02/2019, now measuring 3.5 x 2.7 cm in transaxial dimensions. As before, there is mass effect upon the underlying right temporal occipital lobes with partial effacement of the right lateral ventricle and mild surrounding vasogenic edema. 4. Chronic right basal ganglia lacunar infarct, new from the prior exam. 5. Otherwise stable non-contrast CT appearance of the brain with generalized cerebral atrophy and chronic small vessel ischemic disease with chronic  lacunar infarcts, as described. 6. Trace left mastoid effusion. CT cervical spine: 1. No evidence of acute fracture to the cervical spine. 2. Nonspecific straightening of the expected cervical lordosis. 3. Cervical spondylosis, as described Electronically Signed   By: Kellie Simmering DO   On: 04/21/2021 08:24   DG Hip Unilat With Pelvis 2-3 Views Right  Result Date: 04/21/2021 CLINICAL DATA:  Pain after fall. EXAM: DG HIP (WITH OR WITHOUT PELVIS) 2-3V RIGHT COMPARISON:  Radiographs of the right hip 11/26/2018. FINDINGS: Acute, displaced subcapital right femoral neck fracture. No other acute fracture is identified. Bilateral femoroacetabular joint degenerative changes. IMPRESSION: Acute, displaced subcapital right femoral neck fracture. Electronically Signed   By: Kellie Simmering DO   On: 04/21/2021 08:05    Positive ROS: All other systems have been reviewed and were otherwise negative with the exception of those mentioned in the HPI and as above.  Physical Exam: General:  Alert, no acute distress Psychiatric:  Patient is not competent for consent, but exhibits normal mood and affect   Cardiovascular:  No pedal edema Respiratory:  No wheezing, non-labored breathing GI:  Abdomen is soft and non-tender Skin:  No lesions in the area of chief complaint Neurologic:  Sensation intact distally Lymphatic:  No axillary or cervical lymphadenopathy  Orthopedic Exam:  Orthopedic examination is limited to the right hip and lower extremity.  The right lower extremity appears to be slightly internally rotated as compared to the left but does appear to be out to length.  Skin inspection over the right hip is unremarkable.  No swelling, erythema, ecchymosis, abrasions, or other skin abnormalities are identified.  She has mild tenderness to palpation over the lateral aspect of the right hip.  She has more severe pain with any attempted active or passive motion of the right hip.  She is neurovascularly intact to the right  lower extremity and foot, demonstrating the ability to actively dorsiflex and plantarflex her toes and ankle.  Sensations intact light touch to all distributions.  She has good capillary refill to her right foot.  X-rays:  Recent x-rays of the pelvis and right hip are available for review and have been reviewed by myself.  These films demonstrate a valgus impacted right femoral neck fracture with some posterior displacement.  No significant degenerative changes are noted.  No lytic lesions or other acute bony abnormalities are identified.  Assessment: Closed displaced right femoral neck fracture.  Plan: The treatment options, including both surgical and nonsurgical choices, have been discussed with the patient and her son, Shelly Padilla, who is the patient's power of attorney and healthcare proxy.  The patient's son would like to proceed with surgical intervention to include a right hip hemiarthroplasty.  This procedure has been discussed in detail with the patient's son, as were the potential risks (including bleeding, infection, nerve and/or blood vessel injury, persistent or recurrent pain, loosening or failure of the components, leg length inequality, dislocation, need for further surgery, blood clots, strokes, heart attacks or arrhythmias, pneumonia, etc.) and benefits.  The patient's son  states his understanding and agrees to proceed.  He agrees to a blood transfusion on her behalf if necessary.  A formal telephone consent has been obtained.  Thank you for asking me to participate in the care of this pleasant yet unfortunate woman.  I will be happy to follow her with you.   Pascal Lux, MD  Beeper #:  [phone]  04/21/2021 4:51 PM

## 2021-04-21 NOTE — ED Provider Notes (Signed)
Emergency Medicine Provider Triage Evaluation Note  Shelly Padilla , a 87 y.o. female  was evaluated in triage.  Pt complains of unwitnessed fall.  Patient has history of dementia and is unable to provide any history.  Reportedly she was found on the ground and complaining of back pain.  She reportedly takes Eliquis.  She is also reporting pain in her right hip.  Review of Systems  Positive: Unwitnessed fall, dementia, back and hip pain Negative: Vomiting, chest pain, shortness of breath  Physical Exam  BP (!) 165/83 (BP Location: Left Arm)   Pulse 86   Temp (!) 97.5 F (36.4 C) (Oral)   Resp 12   SpO2 95%  Gen:   Awake, appears to be in pain, especially with any movement. Resp:  Normal effort  MSK:   Patient does not want to move due to pain.  Reports pain in the right hip or proximal femur with passive range of motion of the right lower extremity.  Also reports severe pain in her lower back when trying to sit up. Other:  No headache or neck pain but the patient has contusions to the right side of her head.  Medical Decision Making  Medically screening exam initiated at 6:33 AM.  Appropriate orders placed.  Adrean Findlay was informed that the remainder of the evaluation will be completed by another provider, this initial triage assessment does not replace that evaluation, and the importance of remaining in the ED until their evaluation is complete.  I suspect an orthopedic injury, most likely of the proximal femur, also possibly of the lower back.  Given her dementia, unwitnessed fall, and history of Eliquis, I ordered CT head, CT cervical spine, and x-rays of the thoracic spine, lumbar spine, and pelvis/right hip.  Patient is in a great deal of discomfort and I ordered morphine 4 L IV and Zofran 4 mg IV.  Also ordered standard laboratory work-up for probable orthopedic injury.  Patient is on continuous pulse oximeter since we are giving her morphine and she is 87 years old.   Hinda Kehr, MD 04/21/21 [phone]

## 2021-04-21 NOTE — Progress Notes (Signed)
Pt admitted to room 136, pt oriented to self. Pt instructed on how to use call bell and placed in room close to nursing station. Pt resting comfortably in bed.   04/21/21 1738  Vitals  Temp 97.8 F (36.6 C)  Temp Source Oral  BP 112/65  MAP (mmHg) 80  BP Location Left Arm  BP Method Automatic  Patient Position (if appropriate) Lying  Pulse Rate 91  Resp 16  MEWS COLOR  MEWS Score Color Green  Oxygen Therapy  SpO2 93 %  O2 Device Room [employer]

## 2021-04-21 NOTE — ED Notes (Signed)
High fall precautions in place, pt moved to room 6 from [facility]. Purewick placed for urinary management. Stretcher locked in low position with side rails up x2, call light in reach.  I did speak with pt's son, Cecilie Lowers, and discussed [place]. He requests to speak with MD, Dr Corky Downs aware and will call when available.

## 2021-04-21 NOTE — ED Notes (Signed)
Attempted to call pt's son, Cecilie Lowers, at [phone]. Unable to leave voicemail.

## 2021-04-22 ENCOUNTER — Inpatient Hospital Stay: Payer: Medicare Other

## 2021-04-22 ENCOUNTER — Encounter
Admission: EM | Disposition: A | Discharge status: Skilled Nursing Facility | Payer: Self-pay | Source: Skilled Nursing Facility | Attending: Hospitalist

## 2021-04-22 ENCOUNTER — Encounter: Payer: Self-pay | Admitting: Internal Medicine

## 2021-04-22 ENCOUNTER — Inpatient Hospital Stay: Payer: Medicare Other | Admitting: Anesthesiology

## 2021-04-22 ENCOUNTER — Other Ambulatory Visit: Payer: Self-pay

## 2021-04-22 DIAGNOSIS — I480 Paroxysmal atrial fibrillation: Secondary | ICD-10-CM

## 2021-04-22 HISTORY — PX: HIP ARTHROPLASTY: SHX981

## 2021-04-22 LAB — BASIC METABOLIC PANEL
Anion gap: 10 (ref 5–15)
BUN: 24 mg/dL — ABNORMAL HIGH (ref 8–23)
CO2: 26 mmol/L (ref 22–32)
Calcium: 8.6 mg/dL — ABNORMAL LOW (ref 8.9–10.3)
Chloride: 100 mmol/L (ref 98–111)
Creatinine, Ser: 0.55 mg/dL (ref 0.44–1.00)
GFR, Estimated: >60 mL/min (ref >60)
Glucose, Bld: 115 mg/dL — ABNORMAL HIGH (ref 70–99)
Potassium: 4.1 mmol/L (ref 3.5–5.1)
Sodium: 136 mmol/L (ref 135–145)

## 2021-04-22 LAB — CBC
HCT: 38.3 % (ref 36.0–46.0)
Hemoglobin: 12.5 g/dL (ref 12.0–15.0)
MCH: 29.6 pg (ref 26.0–34.0)
MCHC: 32.6 g/dL (ref 30.0–36.0)
MCV: 90.8 fL (ref 80.0–100.0)
Platelets: 743 10*3/uL — ABNORMAL HIGH (ref 150–400)
RBC: 4.22 MIL/uL (ref 3.87–5.11)
RDW: 12.1 % (ref 11.5–15.5)
WBC: 17.8 10*3/uL — ABNORMAL HIGH (ref 4.0–10.5)
nRBC: 0 % (ref 0.0–0.2)

## 2021-04-22 LAB — SURGICAL PCR SCREEN
MRSA, PCR: NEGATIVE
Staphylococcus aureus: NEGATIVE

## 2021-04-22 SURGERY — HEMIARTHROPLASTY, HIP, DIRECT ANTERIOR APPROACH, FOR FRACTURE
Anesthesia: General | Site: Hip | Laterality: Right

## 2021-04-22 MED ORDER — MAGNESIUM HYDROXIDE 400 MG/5ML PO SUSP
30.0000 mL | Freq: Every day | ORAL | Status: DC | PRN
  Administered 2021-04-27: 30 mL via ORAL
  Filled 2021-04-22: qty 30

## 2021-04-22 MED ORDER — LIDOCAINE HCL (CARDIAC) PF 100 MG/5ML IV SOSY
PREFILLED_SYRINGE | INTRAVENOUS | Status: DC | PRN
  Administered 2021-04-22: 100 mg via INTRAVENOUS

## 2021-04-22 MED ORDER — METOCLOPRAMIDE HCL 5 MG/ML IJ SOLN: 5.0000 mg | Freq: Three times a day (TID) | INTRAMUSCULAR | Status: DC | PRN

## 2021-04-22 MED ORDER — FENTANYL CITRATE (PF) 100 MCG/2ML IJ SOLN
INTRAMUSCULAR | Status: AC
  Filled 2021-04-22: qty 2

## 2021-04-22 MED ORDER — ONDANSETRON HCL 4 MG/2ML IJ SOLN
INTRAMUSCULAR | Status: AC
  Filled 2021-04-22: qty 2

## 2021-04-22 MED ORDER — ACETAMINOPHEN 10 MG/ML IV SOLN
INTRAVENOUS | Status: AC
  Filled 2021-04-22: qty 100

## 2021-04-22 MED ORDER — ONDANSETRON HCL 4 MG/2ML IJ SOLN
INTRAMUSCULAR | Status: DC | PRN
  Administered 2021-04-22: 4 mg via INTRAVENOUS

## 2021-04-22 MED ORDER — PHENYLEPHRINE HCL (PRESSORS) 10 MG/ML IV SOLN
INTRAVENOUS | Status: AC
  Filled 2021-04-22: qty 1

## 2021-04-22 MED ORDER — SODIUM CHLORIDE 0.9 % IV SOLN
INTRAVENOUS | Status: DC | PRN
  Administered 2021-04-22: 40 ug/min via INTRAVENOUS

## 2021-04-22 MED ORDER — BISACODYL 10 MG RE SUPP
10.0000 mg | Freq: Every day | RECTAL | Status: DC | PRN
  Administered 2021-04-26 – 2021-04-28 (×3): 10 mg via RECTAL
  Filled 2021-04-22 (×3): qty 1

## 2021-04-22 MED ORDER — SENNOSIDES-DOCUSATE SODIUM 8.6-50 MG PO TABS
1.0000 | ORAL_TABLET | Freq: Two times a day (BID) | ORAL | Status: DC | PRN
  Administered 2021-04-27: 1 via ORAL
  Filled 2021-04-22 (×2): qty 1

## 2021-04-22 MED ORDER — LABETALOL HCL 5 MG/ML IV SOLN
5.0000 mg | Freq: Once | INTRAVENOUS | Status: AC
  Administered 2021-04-22: 5 mg via INTRAVENOUS

## 2021-04-22 MED ORDER — CEFAZOLIN SODIUM-DEXTROSE 2-4 GM/100ML-% IV SOLN
2.0000 g | Freq: Four times a day (QID) | INTRAVENOUS | Status: AC
  Administered 2021-04-22 – 2021-04-23 (×3): 2 g via INTRAVENOUS
  Filled 2021-04-22 (×3): qty 100

## 2021-04-22 MED ORDER — LIDOCAINE HCL (PF) 2 % IJ SOLN
INTRAMUSCULAR | Status: AC
  Filled 2021-04-22: qty 5

## 2021-04-22 MED ORDER — FENTANYL CITRATE (PF) 100 MCG/2ML IJ SOLN
INTRAMUSCULAR | Status: DC | PRN
  Administered 2021-04-22 (×4): 25 ug via INTRAVENOUS

## 2021-04-22 MED ORDER — ONDANSETRON HCL 4 MG PO TABS: 4.0000 mg | ORAL_TABLET | Freq: Four times a day (QID) | ORAL | Status: DC | PRN

## 2021-04-22 MED ORDER — PROPOFOL 10 MG/ML IV BOLUS
INTRAVENOUS | Status: DC | PRN
  Administered 2021-04-22: 80 mg via INTRAVENOUS

## 2021-04-22 MED ORDER — PHENYLEPHRINE HCL (PRESSORS) 10 MG/ML IV SOLN
INTRAVENOUS | Status: DC | PRN
  Administered 2021-04-22: 200 ug via INTRAVENOUS
  Administered 2021-04-22 (×2): 100 ug via INTRAVENOUS

## 2021-04-22 MED ORDER — TRAMADOL HCL 50 MG PO TABS
50.0000 mg | ORAL_TABLET | Freq: Four times a day (QID) | ORAL | Status: DC | PRN
  Administered 2021-04-26 – 2021-04-27 (×2): 50 mg via ORAL
  Filled 2021-04-22 (×2): qty 1

## 2021-04-22 MED ORDER — DEXAMETHASONE SODIUM PHOSPHATE 10 MG/ML IJ SOLN
INTRAMUSCULAR | Status: DC | PRN
  Administered 2021-04-22: 10 mg via INTRAVENOUS

## 2021-04-22 MED ORDER — SUGAMMADEX SODIUM 200 MG/2ML IV SOLN
INTRAVENOUS | Status: DC | PRN
  Administered 2021-04-22: 200 mg via INTRAVENOUS

## 2021-04-22 MED ORDER — ACETAMINOPHEN 10 MG/ML IV SOLN
INTRAVENOUS | Status: DC | PRN
  Administered 2021-04-22: 1000 mg via INTRAVENOUS

## 2021-04-22 MED ORDER — ENOXAPARIN SODIUM 40 MG/0.4ML IJ SOSY
40.0000 mg | PREFILLED_SYRINGE | INTRAMUSCULAR | Status: DC
  Administered 2021-04-23 – 2021-04-29 (×7): 40 mg via SUBCUTANEOUS
  Filled 2021-04-22 (×7): qty 0.4

## 2021-04-22 MED ORDER — LABETALOL HCL 5 MG/ML IV SOLN
INTRAVENOUS | Status: AC
  Administered 2021-04-22: 20 mg
  Filled 2021-04-22: qty 4

## 2021-04-22 MED ORDER — MIDAZOLAM HCL 2 MG/2ML IJ SOLN
INTRAMUSCULAR | Status: AC
  Filled 2021-04-22: qty 2

## 2021-04-22 MED ORDER — ACETAMINOPHEN 500 MG PO TABS
1000.0000 mg | ORAL_TABLET | Freq: Four times a day (QID) | ORAL | Status: AC
  Administered 2021-04-23 (×2): 1000 mg via ORAL
  Filled 2021-04-22 (×2): qty 2

## 2021-04-22 MED ORDER — ROCURONIUM BROMIDE 100 MG/10ML IV SOLN
INTRAVENOUS | Status: DC | PRN
  Administered 2021-04-22: 10 mg via INTRAVENOUS
  Administered 2021-04-22: 40 mg via INTRAVENOUS

## 2021-04-22 MED ORDER — FENTANYL CITRATE (PF) 100 MCG/2ML IJ SOLN: 25.0000 ug | INTRAMUSCULAR | Status: DC | PRN

## 2021-04-22 MED ORDER — MEPERIDINE HCL 50 MG/ML IJ SOLN: 6.2500 mg | INTRAMUSCULAR | Status: DC | PRN

## 2021-04-22 MED ORDER — DOCUSATE SODIUM 100 MG PO CAPS
100.0000 mg | ORAL_CAPSULE | Freq: Two times a day (BID) | ORAL | Status: DC
  Administered 2021-04-23 – 2021-04-27 (×9): 100 mg via ORAL
  Filled 2021-04-22 (×10): qty 1

## 2021-04-22 MED ORDER — MUPIROCIN 2 % EX OINT
1.0000 "application " | TOPICAL_OINTMENT | Freq: Two times a day (BID) | CUTANEOUS | Status: DC
  Filled 2021-04-22: qty 22

## 2021-04-22 MED ORDER — ONDANSETRON HCL 4 MG/2ML IJ SOLN: 4.0000 mg | Freq: Four times a day (QID) | INTRAMUSCULAR | Status: DC | PRN

## 2021-04-22 MED ORDER — METOCLOPRAMIDE HCL 10 MG PO TABS: 5.0000 mg | ORAL_TABLET | Freq: Three times a day (TID) | ORAL | Status: DC | PRN

## 2021-04-22 MED ORDER — CHLORHEXIDINE GLUCONATE CLOTH 2 % EX PADS
6.0000 | MEDICATED_PAD | Freq: Every day | CUTANEOUS | Status: DC
  Administered 2021-04-22 – 2021-04-27 (×6): 6 via TOPICAL

## 2021-04-22 MED ORDER — TRANEXAMIC ACID 1000 MG/10ML IV SOLN
INTRAVENOUS | Status: DC | PRN
  Administered 2021-04-22: 1000 mg

## 2021-04-22 MED ORDER — FLEET ENEMA 7-19 GM/118ML RE ENEM: 1.0000 | ENEMA | Freq: Once | RECTAL | Status: DC | PRN

## 2021-04-22 MED ORDER — OXYCODONE HCL 5 MG PO TABS
5.0000 mg | ORAL_TABLET | ORAL | Status: DC | PRN
  Administered 2021-04-23: 5 mg via ORAL
  Administered 2021-04-23: 10 mg via ORAL
  Administered 2021-04-24: 5 mg via ORAL
  Administered 2021-04-24: 10 mg via ORAL
  Filled 2021-04-22: qty 1
  Filled 2021-04-22 (×2): qty 2
  Filled 2021-04-22: qty 1

## 2021-04-22 MED ORDER — DIPHENHYDRAMINE HCL 12.5 MG/5ML PO ELIX: 12.5000 mg | ORAL_SOLUTION | ORAL | Status: DC | PRN

## 2021-04-22 MED ORDER — SODIUM CHLORIDE 0.9 % IV SOLN
INTRAVENOUS | Status: DC | PRN
  Administered 2021-04-22: 90 mL

## 2021-04-22 SURGICAL SUPPLY — 66 items
BAG DECANTER FOR FLEXI CONT (MISCELLANEOUS) IMPLANT
BLADE SAGITTAL WIDE XTHICK NO (BLADE) ×2 IMPLANT
BLADE SURG SZ20 CARB STEEL (BLADE) ×2 IMPLANT
BNDG COHESIVE 6X5 TAN STRL LF (GAUZE/BANDAGES/DRESSINGS) ×2 IMPLANT
BOWL CEMENT MIXING ADV NOZZLE (MISCELLANEOUS) IMPLANT
CANISTER SUCT 1200ML W/VALVE (MISCELLANEOUS) ×2 IMPLANT
CHLORAPREP W/TINT 26 (MISCELLANEOUS) ×4 IMPLANT
COVER WAND RF STERILE (DRAPES) ×2 IMPLANT
DECANTER SPIKE VIAL GLASS SM (MISCELLANEOUS) ×4 IMPLANT
DRAPE 3/4 80X56 (DRAPES) ×2 IMPLANT
DRAPE INCISE IOBAN 66X60 STRL (DRAPES) ×2 IMPLANT
DRAPE ORTHO SPLIT 77X108 STRL (DRAPES) ×4
DRAPE SURG 17X11 SM STRL (DRAPES) ×2 IMPLANT
DRAPE SURG 17X23 STRL (DRAPES) ×2 IMPLANT
DRAPE SURG ORHT 6 SPLT 77X108 (DRAPES) ×2 IMPLANT
DRSG OPSITE POSTOP 4X12 (GAUZE/BANDAGES/DRESSINGS) ×2 IMPLANT
DRSG OPSITE POSTOP 4X14 (GAUZE/BANDAGES/DRESSINGS) IMPLANT
DRSG OPSITE POSTOP 4X8 (GAUZE/BANDAGES/DRESSINGS) ×2 IMPLANT
ELECT BLADE 6.5 EXT (BLADE) ×2 IMPLANT
ELECT CAUTERY BLADE 6.4 (BLADE) ×2 IMPLANT
ELECT REM PT RETURN 9FT ADLT (ELECTROSURGICAL) ×2
ELECTRODE REM PT RTRN 9FT ADLT (ELECTROSURGICAL) ×1 IMPLANT
GAUZE PACK 2X3YD (PACKING) IMPLANT
GLOVE SRG 8 PF TXTR STRL LF DI (GLOVE) IMPLANT
GLOVE SURG ENC MOIS LTX SZ8 (GLOVE) ×6 IMPLANT
GLOVE SURG ENC TEXT LTX SZ7.5 (GLOVE) IMPLANT
GLOVE SURG UNDER LTX SZ8 (GLOVE) ×2 IMPLANT
GLOVE SURG UNDER POLY LF SZ8 (GLOVE)
GOWN STRL REUS W/ TWL LRG LVL3 (GOWN DISPOSABLE) ×1 IMPLANT
GOWN STRL REUS W/ TWL XL LVL3 (GOWN DISPOSABLE) ×1 IMPLANT
GOWN STRL REUS W/TWL LRG LVL3 (GOWN DISPOSABLE) ×2
GOWN STRL REUS W/TWL XL LVL3 (GOWN DISPOSABLE) ×2
HANDLE YANKAUER SUCT BULB TIP (MISCELLANEOUS) ×1 IMPLANT
HEAD ENDO II MOD SZ 46 (Orthopedic Implant) ×1 IMPLANT
HOOD PEEL AWAY FLYTE STAYCOOL (MISCELLANEOUS) ×4 IMPLANT
INSERT TAPER ENDO II -3 (Orthopedic Implant) ×1 IMPLANT
IV NS 100ML SINGLE PACK (IV SOLUTION) IMPLANT
IV NS IRRIG 3000ML ARTHROMATIC (IV SOLUTION) ×4 IMPLANT
LABEL OR SOLS (LABEL) ×2 IMPLANT
MANIFOLD NEPTUNE II (INSTRUMENTS) ×2 IMPLANT
NDL FILTER BLUNT 18X1 1/2 (NEEDLE) ×1 IMPLANT
NDL SAFETY ECLIPSE 18X1.5 (NEEDLE) ×1 IMPLANT
NDL SPNL 20GX3.5 QUINCKE YW (NEEDLE) ×1 IMPLANT
NEEDLE FILTER BLUNT 18X 1/2SAF (NEEDLE) ×1
NEEDLE FILTER BLUNT 18X1 1/2 (NEEDLE) ×1 IMPLANT
NEEDLE HYPO 18GX1.5 SHARP (NEEDLE) ×2
NEEDLE SPNL 20GX3.5 QUINCKE YW (NEEDLE) ×2 IMPLANT
NS IRRIG 1000ML POUR BTL (IV SOLUTION) ×2 IMPLANT
PACK HIP PROSTHESIS (MISCELLANEOUS) ×2 IMPLANT
PULSAVAC PLUS IRRIG FAN TIP (DISPOSABLE) ×2
STAPLER SKIN PROX 35W (STAPLE) ×2 IMPLANT
STEM FEMORAL 9X15MM HIP 130D (Stem) ×1 IMPLANT
STRAP SAFETY 5IN WIDE (MISCELLANEOUS) ×2 IMPLANT
SUT ETHIBOND 2 V 37 (SUTURE) ×2 IMPLANT
SUT VIC AB 1 CT1 36 (SUTURE) IMPLANT
SUT VIC AB 2-0 CT1 (SUTURE) ×5 IMPLANT
SUT VIC AB 2-0 CT1 27 (SUTURE)
SUT VIC AB 2-0 CT1 TAPERPNT 27 (SUTURE) IMPLANT
SUT VICRYL 1-0 27IN ABS (SUTURE) ×4
SUTURE VICRYL 1-0 27IN ABS (SUTURE) ×2 IMPLANT
SYR 10ML LL (SYRINGE) ×2 IMPLANT
SYR 30ML LL (SYRINGE) ×6 IMPLANT
SYR TB 1ML 27GX1/2 LL (SYRINGE) IMPLANT
TAPE TRANSPORE STRL 2 31045 (GAUZE/BANDAGES/DRESSINGS) ×2 IMPLANT
TIP BRUSH PULSAVAC PLUS 24.33 (MISCELLANEOUS) IMPLANT
TIP FAN IRRIG PULSAVAC PLUS (DISPOSABLE) ×1 IMPLANT

## 2021-04-22 NOTE — Progress Notes (Signed)
Initial Nutrition Assessment  DOCUMENTATION CODES:  Non-severe (moderate) malnutrition in context of chronic illness  INTERVENTION:   Advance to regular diet after surgery   Ensure Enlive po BID, each supplement provides 350 kcal and 20 grams of protein  Continue MVI with minerals daily  NUTRITION DIAGNOSIS:  Moderate Malnutrition related to chronic illness (dementia) as evidenced by mild muscle depletion,moderate fat depletion,mild fat depletion,moderate muscle depletion.  GOAL:  Patient will meet greater than or equal to 90% of their needs  MONITOR:  PO intake,Diet advancement,Skin  REASON FOR ASSESSMENT:  Consult Hip fracture protocol  ASSESSMENT:  Pt presented to ED with pain after a fall at her facility. Imaging showed a right hip fracture. Pt dependent for most ADLs at baseline. PMH relevant for advanced dementia, HTN, atrial fibrillation, Hx CVA, GERD.   Pt resting in bed at the time of visit. Unable to answer nutrition questions due to dementia, but reports that her leg is hurting today. Pt currently NPO and is planned for surgical repair of fx this afternoon after being cleared by cardiology.   After surgery, would favor allowing pt a regular diet due to her advanced age and dementia to maximize intake. Will also add nutrition supplement to optimize post-op healing and recovery  Relevant Scheduled Meds: . atorvastatin  10 mg Oral Daily  .  famotidine  20 mg Oral QHS  . memantine  5 mg Oral BID  . multivitamin with minerals  1 tablet Oral Daily  .  polyethylene glycol  17 g Oral Daily   Relevant Continuous Infusions: . sodium chloride 50 mL/hr at 04/22/21 1249   Relevant PRN Meds: ondansetron, promethazine, senna-docusate  Labs reviewed  NUTRITION - FOCUSED PHYSICAL EXAM: Flowsheet Row Most Recent Value  Orbital Region Moderate depletion  Upper Arm Region Mild depletion  Thoracic and Lumbar Region Mild depletion  Buccal Region Moderate depletion  Temple  Region Mild depletion  Clavicle Bone Region Mild depletion  Clavicle and Acromion Bone Region Moderate depletion  Scapular Bone Region Mild depletion  Dorsal Hand No depletion  Patellar Region No depletion  Anterior Thigh Region No depletion  Posterior Calf Region Mild depletion  Edema (RD Assessment) Mild  Hair Reviewed  Eyes Reviewed  Mouth Reviewed  Skin Reviewed  Nails Reviewed     Diet Order:   Diet Order            Diet NPO time specified Except for: Sips with Meds  Diet effective midnight                EDUCATION NEEDS:  No education needs have been identified at this time  Skin:  Skin Assessment: Reviewed RN Assessment  Last BM:  unknown  Height:  Ht Readings from Last 1 Encounters:  09/02/19 5\' 7"  (1.702 m)    Weight:  Wt Readings from Last 1 Encounters:  09/02/19 65 kg    Ideal Body Weight:  61.4 kg  BMI:  There is no height or weight on file to calculate BMI.  Estimated Nutritional Needs:   Kcal:  1500-1700 kcal/d  Protein:  75-85 g/d  Fluid:  >1543mL/d   Ranell Patrick, RD, LDN Clinical Dietitian Pager on [place]

## 2021-04-22 NOTE — Op Note (Signed)
04/22/2021  5:28 PM  Patient:   Shelly Padilla  Pre-Op Diagnosis:   Displaced femoral neck fracture, right hip.  Post-Op Diagnosis:   Same.  Procedure:   Right hip unipolar hemiarthroplasty.  Surgeon:   Pascal Lux, MD  Assistant:   Marijean Bravo, PA-S  Anesthesia:   GET  Findings:   As above.  Complications:   None  EBL:   50 cc  Fluids:   700 cc crystalloid  UOP:   140 cc  TT:   None  Drains:   None  Closure:   Staples  Implants:   Biomet press-fit system with a #9 lateralized offset reduced proximal profile Echo femoral stem, a 46 mm outer diameter shell, and a -3 mm neck adapter.  Brief Clinical Note:   The patient is a 87 year old female who sustained the above-noted injury yesterday morning when she fell onto her right side while at her assisted living facility. She was brought to the emergency room where x-rays demonstrated the above-noted injury. The patient has been cleared medically and presents at this time for definitive management of the injury.  Procedure:   The patient was brought into the operating room and lain in the supine position. After adequate general endotracheal intubation and anesthesia were obtained, the patient was repositioned in the left lateral decubitus position and secured using a lateral hip positioner. The right hip and lower extremity were prepped with ChloroPrep solution before being draped sterilely. Preoperative antibiotics were administered. A timeout was performed to verify the appropriate surgical site.    A standard posterior approach to the hip was made through an approximately 4-5 inch incision. The incision was carried down through the subcutaneous tissues to expose the gluteal fascia and proximal end of the iliotibial band. These structures were split the length of the incision and the Charnley self-retaining hip retractor placed. The bursal tissues were swept posteriorly to expose the short external rotators. The anterior  border of the piriformis tendon was identified and this plane developed down through the capsule to enter the joint. Abundant fracture hematoma was suctioned. A flap of tissue was elevated off the posterior aspect of the femoral neck and greater trochanter and retracted posteriorly. This flap included the piriformis tendon, the short external rotators, and the posterior capsule. The femoral head was removed in its entirety, then taken to the back table where it was measured and found to be optimally replicated by a 46 mm head. The appropriate trial head was inserted and found to demonstrate an excellent suction fit.   Attention was directed to the femoral side. The femoral neck was recut 10-12 mm above the lesser trochanter using an oscillating saw. The piriformis fossa was debrided of soft tissues before the intramedullary canal was accessed through this point using a triple step reamer. The canal was reamed sequentially beginning with a #7 tapered reamer and progressing to a #9 tapered reamer. This provided excellent circumferential chatter. A box osteotome was used to establish version before the canal was broached sequentially beginning with a #7 broach and progressing to a #9 broach. This was left in place and several trial reductions performed. The permanent #9 reduced proximal profile femoral stem was impacted into place. A repeat trial reduction was performed using both the -6 mm and -3 mm neck lengths. The -3 mm neck length demonstrated excellent stability both in extension and external rotation as well as with flexion to 90 and internal rotation beyond 70. It also was stable in the  position of sleep. The 46 mm outer diameter shell with the -3 mm neck adapter construct was put together on the back table before being impacted onto the stem of the femoral component. The Morse taper locking mechanism was verified using manual distraction before the head was relocated and the hip placed through a range of  motion with the findings as described above.  The wound was copiously irrigated with bacitracin saline solution via the jet lavage system before the peri-incisional and pericapsular tissues were injected with 30 cc of 0.5% Sensorcaine with epinephrine and 20 cc of Exparel diluted out to 60 cc with normal saline to help with postoperative analgesia. The posterior flap was reapproximated to the posterior aspect of the greater trochanter using #2 Tycron interrupted sutures placed through drill holes. The iliotibial band was reapproximated using #1 Vicryl interrupted sutures before the gluteal fascia was closed using a running #1 Vicryl suture. At this point, 1 g of transexemic acid in 10 cc of normal saline was injected into the joint to help reduce postoperative bleeding. The subcutaneous tissues were closed in several layers using 2-0 Vicryl interrupted sutures before the skin was closed using staples. A sterile occlusive dressing was applied to the wound . The patient then was rolled back into the supine position on the hospital bed before being awakened, extubated, and returned to the recovery room in satisfactory condition after tolerating the procedure well.

## 2021-04-22 NOTE — Transfer of Care (Signed)
Immediate Anesthesia Transfer of Care Note  Patient: Shelly Padilla  Procedure(s) Performed: ARTHROPLASTY BIPOLAR HIP (HEMIARTHROPLASTY) (Right Hip)  Patient Location: PACU  Anesthesia Type:General  Level of Consciousness: drowsy  Airway & Oxygen Therapy: Patient Spontanous Breathing and Patient connected to face mask oxygen  Post-op Assessment: Report given to RN and Post -op Vital signs reviewed and stable  Post vital signs: Reviewed and stable  Last Vitals:  Vitals Value Taken Time  BP 164/65 04/22/21 1711  Temp    Pulse 77 04/22/21 1715  Resp 14 04/22/21 1715  SpO2 99 % 04/22/21 1715  Vitals shown include unvalidated device data.  Last Pain:  Vitals:   04/22/21 1351  TempSrc: Temporal  PainSc: Asleep         Complications: No complications documented.

## 2021-04-22 NOTE — Progress Notes (Signed)
PROGRESS NOTE    Shelly Padilla  JHE:[id number] DOB: [birth year] DOA: 04/21/2021 PCP: Shelly Carbon, MD   Brief Narrative: Taken from H&P Shelly Padilla is a 87 y.o. female with medical history significant of HTN, HLD, A fib on Eliquis, TIA, stroke, dementia, thrombocytosis, brain meningioma, depression, GERD, duodenitis, who presents with fall and right hip pain.  Pt has hx of dementia, her mental status was reportedly at baseline, but still cannot provide accurate medical history, therefore, most of the history is obtained by discussing the case with ED physician, per EMS report, and with the nursing staff.  History is limited.  Per report, patient had unwitnessed fall in facility.  She complains of lower back pain and right hip pain.  She was found to have acute, displaced subcapital right femoral neck fracture.  CT head with a small subarachnoid hemorrhage and chronic meningioma with some mass-effect.  Neurosurgery was consulted and they were recommending conservative management, repeat CT head today was without any new changes. Orthopedic was consulted and patient's son decided to proceed with surgery.  Subjective: Patient was complaining of right hip pain, denies any headache or change in her vision.  Assessment & Plan:   Principal Problem:   Closed right hip fracture (HCC) Active Problems:   HTN (hypertension)   Thrombocytosis   TIA (transient ischemic attack)   Atrial fibrillation, chronic (HCC)   Fall   SAH (subarachnoid hemorrhage) (HCC)   Leukocytosis   GERD (gastroesophageal reflux disease)   HLD (hyperlipidemia)   Depression   Paroxysmal A-fib (HCC)  Closed right hip fracture.  X-ray shows acute, displaced subcapital right femoral neck fracture. Patient is high risk for surgery secondary to recent subarachnoid hemorrhage and advanced age. Orthopedic discussed with son and decided to proceed with right hemiarthroplasty which will happen later today. -Follow-up  postoperative recommendations -Continue with pain management -PT/OT evaluation after surgery  Small subarachnoid hemorrhage.  Secondary to fall, patient was on Eliquis.  Repeat CT head this morning was without any progression.  Neurosurgery would like to manage conservatively.  No neurologic deficit. -Continue to monitor -Keep holding Eliquis for next couple of weeks at least.  Paroxysmal atrial fibrillation. -Continue metoprolol -Keep holding Eliquis due to recent small subarachnoid hemorrhage, patient is high risk for fall secondary to age and other comorbidities, will need a discussion with her cardiologist regarding continuation of anticoagulation.  Hypertension.  Blood pressure within goal. -Continue ramipril and metoprolol. -Continue with as needed hydralazine  Thrombocytosis: This is chronic issue.  Platelet 833 -Follow-up by CBC  TIA (transient ischemic attack) and history of stroke -Continue Lipitor -hold Eliquis   Leukocytosis:  Improving, WBC 17.5 >>12.5, no fever.  No source of infection identified.  Likely reactive.  UA with only mild proteinuria. -Continue to monitor  GERD (gastroesophageal reflux disease) -Pepcid  Dementia: -Namenda  HLD (hyperlipidemia) -Lipitor  Objective: Vitals:   04/22/21 0030 04/22/21 0420 04/22/21 0750 04/22/21 1116  BP: (!) 150/81 (!) 118/50 (!) 141/73 (!) 164/95  Pulse: 100 98 (!) 102 97  Resp: 20 16 16 15   Temp: 98.5 F (36.9 C) 99 F (37.2 C) 97.6 F (36.4 C) 97.7 F (36.5 C)  TempSrc: Oral Oral Oral Oral  SpO2: 98% 97% 93% 99%    Intake/Output Summary (Last 24 hours) at 04/22/2021 1246 Last data filed at 04/22/2021 1856 Gross per 24 hour  Intake 564.23 ml  Output --  Net 564.23 ml   There were no vitals filed for this visit.  Examination:  General exam: Pleasant elderly lady, appears calm and comfortable, appears little dry. Respiratory system: Clear to auscultation. Respiratory effort normal. Cardiovascular  system: S1 & S2 heard, RRR. No JVD, murmurs, rubs, gallops or clicks. Gastrointestinal system: Soft, nontender, nondistended, bowel sounds positive. Central nervous system: Alert and oriented. No focal neurological deficits. Extremities: No edema, no cyanosis, pulses intact and symmetrical. Psychiatry: Judgement and insight appear normal.    DVT prophylaxis: SCDs, patient has traumatic small subarachnoid hemorrhage Code Status: DNR Family Communication: Son Shelly Padilla was updated on phone. Disposition Plan:  Status is: Inpatient  Remains inpatient appropriate because:Inpatient level of care appropriate due to severity of illness   Dispo: The patient is from: SNF              Anticipated d/c is to: SNF              [name]               Level of care: Med-Surg  All the records are reviewed and case discussed with Care Management/Social Worker. Management plans discussed with the patient, nursing and they are in agreement.  Consultants:   Orthopedic surgery  Neurosurgery  Cardiology  Procedures:  Antimicrobials:   Data Reviewed: I have personally reviewed following labs and imaging studies  CBC: Recent Labs  Lab 04/21/21 0637 04/22/21 0442  WBC 17.3* 17.8*  NEUTROABS 14.6*  --   HGB 15.2* 12.5  HCT 46.0 38.3  MCV 91.1 90.8  PLT 833* 743*   Basic Metabolic Panel: Recent Labs  Lab 04/21/21 0637 04/22/21 0442  NA 134* 136  K 3.6 4.1  CL 96* 100  CO2 28 26  GLUCOSE 138* 115*  BUN 16 24*  CREATININE 0.38* 0.55  CALCIUM 8.9 8.6*   GFR: CrCl cannot be calculated (Unknown ideal weight.). Liver Function Tests: No results for input(s): AST, ALT, ALKPHOS, BILITOT, PROT, ALBUMIN in the last 168 hours. No results for input(s): LIPASE, AMYLASE in the last 168 hours. No results for input(s): AMMONIA in the last 168 hours. Coagulation Profile: Recent Labs  Lab 04/21/21 0637  INR 1.1    Cardiac Enzymes: No results for input(s): CKTOTAL, CKMB, CKMBINDEX, TROPONINI in the last 168 hours. BNP (last 3 results) No results for input(s): PROBNP in the last 8760 hours. HbA1C: No results for input(s): HGBA1C in the last 72 hours. CBG: No results for input(s): GLUCAP in the last 168 hours. Lipid Profile: No results for input(s): CHOL, HDL, LDLCALC, TRIG, CHOLHDL, LDLDIRECT in the last 72 hours. Thyroid Function Tests: No results for input(s): TSH, T4TOTAL, FREET4, T3FREE, THYROIDAB in the last 72 hours. Anemia Panel: No results for input(s): VITAMINB12, FOLATE, FERRITIN, TIBC, IRON, RETICCTPCT in the last 72 hours. Sepsis Labs: No results for input(s): PROCALCITON, LATICACIDVEN in the last 168 hours.  Recent Results (from the past 240 hour(s))  Resp Panel by RT-PCR (Flu A&B, Covid) Nasopharyngeal Swab     Status: None   Collection Time: 04/21/21  6:37 AM   Specimen: Nasopharyngeal Swab; Nasopharyngeal(NP) swabs in vial transport medium  Result Value Ref Range Status   SARS Coronavirus 2 by RT PCR NEGATIVE NEGATIVE Final    Comment: (NOTE) SARS-CoV-2 target nucleic acids are NOT DETECTED.  The SARS-CoV-2 RNA is generally detectable in upper respiratory specimens during the acute phase of infection. The lowest concentration of SARS-CoV-2 viral copies this assay can detect is 138 copies/mL. A negative  result does not preclude SARS-Cov-2 infection and should not be used as the sole basis for treatment or other patient management decisions. A negative result may occur with  improper specimen collection/handling, submission of specimen other than nasopharyngeal swab, presence of viral mutation(s) within the areas targeted by this assay, and inadequate number of viral copies(<138 copies/mL). A negative result must be combined with clinical observations, patient history, and epidemiological information. The expected result is Negative.  Fact Sheet for Patients:   EntrepreneurPulse.com.au  Fact Sheet for Healthcare Providers:  IncredibleEmployment.be  This test is no t yet approved or cleared by the [place] FDA and  has been authorized for detection and/or diagnosis of SARS-CoV-2 by FDA under an Emergency Use Authorization (EUA). This EUA will remain  in effect (meaning this test can be used) for the duration of the COVID-19 declaration under Section 564(b)(1) of the Act, 21 U.S.C.section 360bbb-3(b)(1), unless the authorization is terminated  or revoked sooner.       Influenza A by PCR NEGATIVE NEGATIVE Final   Influenza B by PCR NEGATIVE NEGATIVE Final    Comment: (NOTE) The Xpert Xpress SARS-CoV-2/FLU/RSV plus assay is intended as an aid in the diagnosis of influenza from Nasopharyngeal swab specimens and should not be used as a sole basis for treatment. Nasal washings and aspirates are unacceptable for Xpert Xpress SARS-CoV-2/FLU/RSV testing.  Fact Sheet for Patients: EntrepreneurPulse.com.au  Fact Sheet for Healthcare Providers: IncredibleEmployment.be  This test is not yet approved or cleared by the [place] FDA and has been authorized for detection and/or diagnosis of SARS-CoV-2 by FDA under an Emergency Use Authorization (EUA). This EUA will remain in effect (meaning this test can be used) for the duration of the COVID-19 declaration under Section 564(b)(1) of the Act, 21 U.S.C. section 360bbb-3(b)(1), unless the authorization is terminated or revoked.  Performed at [facility], [address] [place], [place] [address]   Surgical PCR screen     Status: None   Collection Time: 04/22/21  1:47 AM   Specimen: Nasal Mucosa; Nasal Swab  Result Value Ref Range Status   MRSA, PCR NEGATIVE NEGATIVE Final   Staphylococcus aureus NEGATIVE NEGATIVE Final    Comment: (NOTE) The Xpert SA Assay (FDA approved for NASAL specimens in patients  10 years of age and older), is one component of a comprehensive surveillance program. It is not intended to diagnose infection nor to guide or monitor treatment. Performed at [facility], [address], [place], [place] [address]      Radiology Studies: DG Chest 1 View  Result Date: 04/21/2021 CLINICAL DATA:  Pain after fall. EXAM: CHEST  1 VIEW COMPARISON:  Prior chest radiographs 11/27/2018 and earlier. FINDINGS: Heart size within normal limits. No appreciable airspace consolidation. No evidence of pleural effusion or pneumothorax. No acute bony abnormality identified. IMPRESSION: No evidence of active cardiopulmonary disease. Electronically Signed   By: Kellie Simmering DO   On: 04/21/2021 07:53   DG Thoracic Spine 2 View  Result Date: 04/21/2021 CLINICAL DATA:  Pain after fall. EXAM: THORACIC SPINE 2 VIEWS COMPARISON:  Prior chest radiographs 11/27/2018 and earlier. FINDINGS: Mild thoracic dextrocurvature. No significant spondylolisthesis. No appreciable thoracic vertebral compression fracture. Thoracic spondylosis with advanced multilevel disc space narrowing and multilevel ventrolateral osteophytes. IMPRESSION: No appreciable thoracic vertebral compression fracture. Mild thoracic dextrocurvature. Advanced thoracic spondylosis. Electronically Signed   By: Kellie Simmering DO   On: 04/21/2021 07:55   DG Lumbar Spine 2-3 Views  Result Date: 04/21/2021 CLINICAL  DATA:  Pain after fall. EXAM: LUMBAR SPINE - 2-3 VIEW COMPARISON:  CT abdomen/pelvis 11/27/2018. FINDINGS: Five lumbar vertebrae. Mild lumbar levocurvature.  Trace L2-L3 grade 1 retrolisthesis. No lumbar vertebral compression fracture. Advanced lumbar spondylosis with multilevel disc space narrowing, anterior and posterior osteophytes and multilevel facet arthrosis IMPRESSION: No lumbar vertebral compression fracture. Mild lumbar levocurvature. Trace L2-L3 grade 1 retrolisthesis. Advanced lumbar spondylosis. Electronically Signed   By:  Kellie Simmering DO   On: 04/21/2021 07:57   CT HEAD WO CONTRAST  Result Date: 04/22/2021 CLINICAL DATA:  Subarachnoid hemorrhage EXAM: CT HEAD WITHOUT CONTRAST TECHNIQUE: Contiguous axial images were obtained from the base of the skull through the vertex without intravenous contrast. COMPARISON:  Apr 21, 2021 FINDINGS: Brain: There is stable atrophy, asymmetric involving portions of the left frontal and parietal lobes. There is a small amount of subarachnoid hemorrhage in the posterior right frontal lobe, slightly more diffuse but overall likely unchanged in volume compared to 1 day prior. No new foci of subarachnoid hemorrhage appreciable. There is again noted an apparent meningioma in the right occipital region containing a small amount of calcification. This mass measures 3.7 x 3.3 x 3.2 cm, unchanged from 1 day prior lowering for slight differences in scan plane. There is stable mass effect on the atrium of the right lateral ventricle in on the immediate sulci. No new mass evident. No appreciable subdural or epidural fluid collection. No midline shift. There is underlying atrophy with decreased attenuation in portions of the centra semiovale consistent with periventricular small vessel disease. Stable small infarct in the region of the genu of the right internal capsule again noted. There is evidence of prior infarct in the left cerebellum at the level of the lateral left dentate nucleus. No new infarct compared to 1 day prior. Vascular: No hyperdense vessel. There is calcification in each carotid siphon region. Skull: Bony calvarium appear intact. Sinuses/Orbits: There is mucosal thickening in several ethmoid air cells. Orbits appear symmetric bilaterally. Other: Mastoid air cells on the right are clear. There is opacification in several left-sided mastoid air cells. IMPRESSION: Small amount of hemorrhage in the posterior right frontal lobe persists. No new foci of hemorrhage. Persistent right occipital region  meningioma with localized mass effect, similar to 1 day prior. Atrophy with periventricular small vessel disease. Prior infarcts in the lateral dentate nucleus of the cerebellum on the left and in the region of the genu of the right internal capsule. No new infarct compared to 1 day prior evident. There are foci of arterial vascular calcification. There is mucosal thickening in several ethmoid air cells. There is opacification in several left-sided mastoid air cells. Electronically Signed   By: Lowella Grip III M.D.   On: 04/22/2021 08:09   CT HEAD WO CONTRAST  Result Date: 04/21/2021 CLINICAL DATA:  Head trauma, minor. Neck trauma. Additional history provided: Unwitnessed fall, patient reports low back pain, right hip pain, small laceration noted to back of scalp. History of atrial fibrillation on Eliquis. EXAM: CT HEAD WITHOUT CONTRAST CT CERVICAL SPINE WITHOUT CONTRAST TECHNIQUE: Multidetector CT imaging of the head and cervical spine was performed following the standard protocol without intravenous contrast. Multiplanar CT image reconstructions of the cervical spine were also generated. COMPARISON:  Prior head CT 09/02/2019. Prior brain MRI 11/29/2018. Cervical spine CT 02/19/2016. FINDINGS: CT HEAD FINDINGS Brain: Mild-to-moderate generalized cerebral atrophy. Small volume acute subarachnoid hemorrhage overlying the right frontal lobe (series 3, image 19) (series 4, image 31) (series 3, image 20). Redemonstrated  partially calcified extra-axial dural-based mass overlying the right temporal occipital lobes compatible with meningioma. The mass has increased in size as compared to the prior head CT of 09/02/2019, now measuring 3.5 x 2.7 cm in transaxial (previously 3.0 x 2.4 cm). As before, there is mass effect upon the underlying right temporal occipital lobes with partial effacement of the right lateral ventricle. Mild surrounding vasogenic edema. No midline shift. Chronic lacunar infarct within the right  basal ganglia, new from the prior head CT of 09/02/2019). Stable background moderate chronic small vessel ischemic disease within the cerebral white matter. A known chronic lacunar infarct within the left cerebellar hemisphere was better appreciated on the prior brain MRI of 11/29/2018. Unchanged mild extra-axial CSF density prominence overlying the left cerebellar hemisphere. No demarcated cortical infarct. Vascular: Calcifications. Skull: Normal. Negative for fracture or focal lesion. Sinuses/Orbits: Visualized orbits show no acute finding. Minimal bilateral ethmoid sinus mucosal thickening. Other: Trace left mastoid effusion. Right frontotemporal scalp soft tissue swelling CT CERVICAL SPINE FINDINGS Alignment: Straightening of the expected cervical lordosis. No significant spondylolisthesis. Skull base and vertebrae: The basion-dental and atlanto-dental intervals are maintained.No evidence of acute fracture to the cervical spine. Soft tissues and spinal canal: No prevertebral fluid or swelling. No visible canal hematoma. Disc levels: Cervical spondylosis with advanced multilevel disc space narrowing, disc bulges, endplate spurring, uncovertebral hypertrophy and facet arthrosis. No appreciable high-grade spinal canal stenosis. Multilevel bony neural foraminal narrowing. Upper chest: No consolidation within the imaged lung apices. No visible pneumothorax. Other: 2.4 cm left thyroid lobe nodule. Thyroid ultrasound follow-up is not acquired given the patient's advanced age. These results were called by telephone at the time of interpretation on 04/21/2021 at 8:22 am to provider Dr. Corky Downs, who verbally acknowledged these results. IMPRESSION: CT head: 1. Small volume acute subarachnoid hemorrhage overlying the right frontal lobe. 2. Right frontotemporal scalp soft tissue swelling. 3. A meningioma overlying the right temporal occipital lobes has increased in size as compared to the head CT of 09/02/2019, now measuring  3.5 x 2.7 cm in transaxial dimensions. As before, there is mass effect upon the underlying right temporal occipital lobes with partial effacement of the right lateral ventricle and mild surrounding vasogenic edema. 4. Chronic right basal ganglia lacunar infarct, new from the prior exam. 5. Otherwise stable non-contrast CT appearance of the brain with generalized cerebral atrophy and chronic small vessel ischemic disease with chronic lacunar infarcts, as described. 6. Trace left mastoid effusion. CT cervical spine: 1. No evidence of acute fracture to the cervical spine. 2. Nonspecific straightening of the expected cervical lordosis. 3. Cervical spondylosis, as described Electronically Signed   By: Kellie Simmering DO   On: 04/21/2021 08:24   CT CERVICAL SPINE WO CONTRAST  Result Date: 04/21/2021 CLINICAL DATA:  Head trauma, minor. Neck trauma. Additional history provided: Unwitnessed fall, patient reports low back pain, right hip pain, small laceration noted to back of scalp. History of atrial fibrillation on Eliquis. EXAM: CT HEAD WITHOUT CONTRAST CT CERVICAL SPINE WITHOUT CONTRAST TECHNIQUE: Multidetector CT imaging of the head and cervical spine was performed following the standard protocol without intravenous contrast. Multiplanar CT image reconstructions of the cervical spine were also generated. COMPARISON:  Prior head CT 09/02/2019. Prior brain MRI 11/29/2018. Cervical spine CT 02/19/2016. FINDINGS: CT HEAD FINDINGS Brain: Mild-to-moderate generalized cerebral atrophy. Small volume acute subarachnoid hemorrhage overlying the right frontal lobe (series 3, image 19) (series 4, image 31) (series 3, image 20). Redemonstrated partially calcified extra-axial dural-based mass overlying the right  temporal occipital lobes compatible with meningioma. The mass has increased in size as compared to the prior head CT of 09/02/2019, now measuring 3.5 x 2.7 cm in transaxial (previously 3.0 x 2.4 cm). As before, there is mass  effect upon the underlying right temporal occipital lobes with partial effacement of the right lateral ventricle. Mild surrounding vasogenic edema. No midline shift. Chronic lacunar infarct within the right basal ganglia, new from the prior head CT of 09/02/2019). Stable background moderate chronic small vessel ischemic disease within the cerebral white matter. A known chronic lacunar infarct within the left cerebellar hemisphere was better appreciated on the prior brain MRI of 11/29/2018. Unchanged mild extra-axial CSF density prominence overlying the left cerebellar hemisphere. No demarcated cortical infarct. Vascular: Calcifications. Skull: Normal. Negative for fracture or focal lesion. Sinuses/Orbits: Visualized orbits show no acute finding. Minimal bilateral ethmoid sinus mucosal thickening. Other: Trace left mastoid effusion. Right frontotemporal scalp soft tissue swelling CT CERVICAL SPINE FINDINGS Alignment: Straightening of the expected cervical lordosis. No significant spondylolisthesis. Skull base and vertebrae: The basion-dental and atlanto-dental intervals are maintained.No evidence of acute fracture to the cervical spine. Soft tissues and spinal canal: No prevertebral fluid or swelling. No visible canal hematoma. Disc levels: Cervical spondylosis with advanced multilevel disc space narrowing, disc bulges, endplate spurring, uncovertebral hypertrophy and facet arthrosis. No appreciable high-grade spinal canal stenosis. Multilevel bony neural foraminal narrowing. Upper chest: No consolidation within the imaged lung apices. No visible pneumothorax. Other: 2.4 cm left thyroid lobe nodule. Thyroid ultrasound follow-up is not acquired given the patient's advanced age. These results were called by telephone at the time of interpretation on 04/21/2021 at 8:22 am to provider Dr. Corky Downs, who verbally acknowledged these results. IMPRESSION: CT head: 1. Small volume acute subarachnoid hemorrhage overlying the right  frontal lobe. 2. Right frontotemporal scalp soft tissue swelling. 3. A meningioma overlying the right temporal occipital lobes has increased in size as compared to the head CT of 09/02/2019, now measuring 3.5 x 2.7 cm in transaxial dimensions. As before, there is mass effect upon the underlying right temporal occipital lobes with partial effacement of the right lateral ventricle and mild surrounding vasogenic edema. 4. Chronic right basal ganglia lacunar infarct, new from the prior exam. 5. Otherwise stable non-contrast CT appearance of the brain with generalized cerebral atrophy and chronic small vessel ischemic disease with chronic lacunar infarcts, as described. 6. Trace left mastoid effusion. CT cervical spine: 1. No evidence of acute fracture to the cervical spine. 2. Nonspecific straightening of the expected cervical lordosis. 3. Cervical spondylosis, as described Electronically Signed   By: Kellie Simmering DO   On: 04/21/2021 08:24   DG Hip Unilat With Pelvis 2-3 Views Right  Result Date: 04/21/2021 CLINICAL DATA:  Pain after fall. EXAM: DG HIP (WITH OR WITHOUT PELVIS) 2-3V RIGHT COMPARISON:  Radiographs of the right hip 11/26/2018. FINDINGS: Acute, displaced subcapital right femoral neck fracture. No other acute fracture is identified. Bilateral femoroacetabular joint degenerative changes. IMPRESSION: Acute, displaced subcapital right femoral neck fracture. Electronically Signed   By: Kellie Simmering DO   On: 04/21/2021 08:05    Scheduled Meds: . atorvastatin  10 mg Oral Daily  . diltiazem  120 mg Oral Daily  . escitalopram  10 mg Oral Once per day on Sun Tue Thu Sat  . [START ON 04/23/2021] escitalopram  5 mg Oral Once per day on Mon Wed Fri  . famotidine  20 mg Oral QHS  . memantine  5 mg Oral BID  .  metoprolol succinate  25 mg Oral Daily  . multivitamin with minerals  1 tablet Oral Daily  . nystatin ointment  1 application Topical BID  . polyethylene glycol  17 g Oral Daily  . ramipril  10 mg  Oral Daily  . timolol  1 drop Both Eyes QPM   Continuous Infusions: . sodium chloride 50 mL/hr at 04/22/21 0715  .  ceFAZolin (ANCEF) IV    . promethazine (PHENERGAN) injection (IM or IVPB) Stopped (04/21/21 2015)     LOS: 1 day   Time spent: 46 minutes. More than 50% of the time was spent in counseling/coordination of care  Lorella Nimrod, MD [facility]  If 7PM-7AM, please contact night-coverage [email or web address]  04/22/2021, 12:46 PM   This record has been created using Systems analyst. Errors have been sought and corrected,but may not always be located. Such creation errors do not reflect on the standard of care.

## 2021-04-22 NOTE — Anesthesia Procedure Notes (Signed)
Procedure Name: Intubation Date/Time: 04/22/2021 3:16 PM Performed by: Nelda Marseille, CRNA Pre-anesthesia Checklist: Patient identified, Patient being monitored, Timeout performed, Emergency Drugs available and Suction available Patient Re-evaluated:Patient Re-evaluated prior to induction Oxygen Delivery Method: Circle system utilized Preoxygenation: Pre-oxygenation with 100% oxygen Induction Type: IV induction Ventilation: Mask ventilation without difficulty Laryngoscope Size: Mac, 3 and McGraph Grade View: Grade I Tube type: Oral Tube size: 7.0 mm Number of attempts: 1 Airway Equipment and Method: Stylet Placement Confirmation: ETT inserted through vocal cords under direct vision,  positive ETCO2 and breath sounds checked- equal and bilateral Secured at: 21 cm Tube secured with: Tape Dental Injury: Teeth and Oropharynx as per pre-operative assessment

## 2021-04-22 NOTE — Anesthesia Preprocedure Evaluation (Addendum)
Anesthesia Evaluation  Patient identified by MRN, date of birth, ID band Patient confused  General Assessment Comment:Deemed optimized by cardiology and neurosurgery  Reviewed: Allergy & Precautions, NPO status , Patient's Chart, lab work & pertinent test results, Unable to perform ROS - Chart review only  Airway Mallampati: II  TM Distance: >3 FB Neck ROM: Full    Dental  (+) Poor Dentition   Pulmonary    Pulmonary exam normal        Cardiovascular hypertension, Normal cardiovascular exam+ dysrhythmias Atrial Fibrillation   TTE 2018:  Left ventricle: The cavity size was normal. Wall thickness was  normal. Systolic function was normal. The estimated ejection  fraction was in the range of 60% to 65%. Left ventricular  diastolic function parameters were normal.  - Aortic valve: There was trivial regurgitation. Valve area (Vmax):  2.76 cm^2.    Neuro/Psych PSYCHIATRIC DISORDERS Depression Small traumatic subdural hemorrhage; meningioma TIACVA    GI/Hepatic hiatal hernia, GERD  ,  Endo/Other    Renal/GU      Musculoskeletal   Abdominal   Peds  Hematology   Anesthesia Other Findings Past Medical History: No date: Brain tumor [facility])     Comment:  meningioma No date: Glaucoma No date: H/O paroxysmal supraventricular tachycardia     Comment:  documented by Holter Monitor No date: Hemorrhoids No date: Hiatal hernia No date: History of echocardiogram     Comment:  a. 11/2017 Echo: EF 60-65%, no rwma, triv AI. No date: History of ovarian cyst No date: HTN (hypertension) No date: Insomnia No date: PAF (paroxysmal atrial fibrillation) (HCC)     Comment:  a. CHA2DS2VASc = 4-->Eliquis. No date: Palpitations No date: Thrombocytosis   Reproductive/Obstetrics                           Anesthesia Physical Anesthesia Plan  ASA: III  Anesthesia Plan: General   Post-op Pain Management:     Induction: Intravenous  PONV Risk Score and Plan: 2 and Ondansetron  Airway Management Planned: Oral ETT  Additional Equipment:   Intra-op Plan:   Post-operative Plan:   Informed Consent: I have reviewed the patients History and Physical, chart, labs and discussed the procedure including the risks, benefits and alternatives for the proposed anesthesia with the patient or authorized representative who has indicated his/her understanding and acceptance.   Patient has DNR.  Suspend DNR.   Dental advisory given and Consent reviewed with POA  Plan Discussed with:   Anesthesia Plan Comments: (Anesthetic Plan discussed with pt's son Lashun Ramseyer, [phone]).  Advised the high risk given age and underlying medical conditions.  Also advised that DNR is suspended while in the OR.  Questions answered.)        Anesthesia Quick Evaluation

## 2021-04-22 NOTE — Progress Notes (Signed)
Progress Note  Patient Name: Shelly Padilla Date of Encounter: 04/22/2021  Primary Cardiologist: Ida Rogue, MD  Subjective   Sleeping upon entry.  Awakens easily.  Disoriented, pleasant.  No c/p, dyspnea, or palpitations.  C/o low back pain.  Says R hip feels ok.  Inpatient Medications    Scheduled Meds: . atorvastatin  10 mg Oral Daily  . diltiazem  120 mg Oral Daily  . escitalopram  10 mg Oral Once per day on Sun Tue Thu Sat  . [START ON 04/23/2021] escitalopram  5 mg Oral Once per day on Mon Wed Fri  . famotidine  20 mg Oral QHS  . memantine  5 mg Oral BID  . metoprolol succinate  25 mg Oral Daily  . multivitamin with minerals  1 tablet Oral Daily  . nystatin ointment  1 application Topical BID  . polyethylene glycol  17 g Oral Daily  . ramipril  10 mg Oral Daily  . timolol  1 drop Both Eyes QPM   Continuous Infusions: . sodium chloride 50 mL/hr at 04/22/21 0715  .  ceFAZolin (ANCEF) IV    . promethazine (PHENERGAN) injection (IM or IVPB) Stopped (04/21/21 2015)   PRN Meds: acetaminophen, hydrALAZINE, methocarbamol, morphine injection, ondansetron (ZOFRAN) IV, oxyCODONE-acetaminophen, promethazine (PHENERGAN) injection (IM or IVPB), senna-docusate, senna-docusate   Vital Signs    Vitals:   04/21/21 2220 04/22/21 0030 04/22/21 0420 04/22/21 0750  BP:  (!) 150/81 (!) 118/50 (!) 141/73  Pulse: 97 100 98 (!) 102  Resp: 20 20 16 16   Temp:  98.5 F (36.9 C) 99 F (37.2 C) 97.6 F (36.4 C)  TempSrc:  Oral Oral Oral  SpO2: 96% 98% 97% 93%    Intake/Output Summary (Last 24 hours) at 04/22/2021 1038 Last data filed at 04/22/2021 1610 Gross per 24 hour  Intake 564.23 ml  Output --  Net 564.23 ml   There were no vitals filed for this visit.  Physical Exam   GEN: Well nourished, well developed, in no acute distress.  HEENT: Grossly normal.  Neck: Supple, no JVD, carotid bruits, or masses. Cardiac: RRR, no murmurs, rubs, or gallops. No clubbing, cyanosis,  edema.  Radials 2+, DP/PT 1+ and equal bilaterally.  Respiratory:  Respirations regular and unlabored, clear to auscultation bilaterally. GI: Soft, nontender, nondistended, BS + x 4. MS: no deformity or atrophy. Skin: warm and dry, no rash. Neuro:  Disoriented to time/place. Strength and sensation are intact. Psych: Nl affect.   Labs    Chemistry Recent Labs  Lab 04/21/21 0637 04/22/21 0442  NA 134* 136  K 3.6 4.1  CL 96* 100  CO2 28 26  GLUCOSE 138* 115*  BUN 16 24*  CREATININE 0.38* 0.55  CALCIUM 8.9 8.6*  GFRNONAA >60 >60  ANIONGAP 10 10     Hematology Recent Labs  Lab 04/21/21 0637 04/22/21 0442  WBC 17.3* 17.8*  RBC 5.05 4.22  HGB 15.2* 12.5  HCT 46.0 38.3  MCV 91.1 90.8  MCH 30.1 29.6  MCHC 33.0 32.6  RDW 12.1 12.1  PLT 833* 743*   Lipids  Lab Results  Component Value Date   CHOL 153 11/30/2018   HDL 44 11/30/2018   LDLCALC 87 11/30/2018   TRIG 109 11/30/2018   CHOLHDL 3.5 11/30/2018    HbA1c  Lab Results  Component Value Date   HGBA1C 5.5 11/27/2017    Radiology    DG Chest 1 View  Result Date: 04/21/2021 CLINICAL DATA:  Pain after fall.  EXAM: CHEST  1 VIEW COMPARISON:  Prior chest radiographs 11/27/2018 and earlier. FINDINGS: Heart size within normal limits. No appreciable airspace consolidation. No evidence of pleural effusion or pneumothorax. No acute bony abnormality identified. IMPRESSION: No evidence of active cardiopulmonary disease. Electronically Signed   By: Kellie Simmering DO   On: 04/21/2021 07:53   DG Thoracic Spine 2 View  Result Date: 04/21/2021 CLINICAL DATA:  Pain after fall. EXAM: THORACIC SPINE 2 VIEWS COMPARISON:  Prior chest radiographs 11/27/2018 and earlier. FINDINGS: Mild thoracic dextrocurvature. No significant spondylolisthesis. No appreciable thoracic vertebral compression fracture. Thoracic spondylosis with advanced multilevel disc space narrowing and multilevel ventrolateral osteophytes. IMPRESSION: No appreciable  thoracic vertebral compression fracture. Mild thoracic dextrocurvature. Advanced thoracic spondylosis. Electronically Signed   By: Kellie Simmering DO   On: 04/21/2021 07:55   DG Lumbar Spine 2-3 Views  Result Date: 04/21/2021 CLINICAL DATA:  Pain after fall. EXAM: LUMBAR SPINE - 2-3 VIEW COMPARISON:  CT abdomen/pelvis 11/27/2018. FINDINGS: Five lumbar vertebrae. Mild lumbar levocurvature.  Trace L2-L3 grade 1 retrolisthesis. No lumbar vertebral compression fracture. Advanced lumbar spondylosis with multilevel disc space narrowing, anterior and posterior osteophytes and multilevel facet arthrosis IMPRESSION: No lumbar vertebral compression fracture. Mild lumbar levocurvature. Trace L2-L3 grade 1 retrolisthesis. Advanced lumbar spondylosis. Electronically Signed   By: Kellie Simmering DO   On: 04/21/2021 07:57   CT HEAD WO CONTRAST  Result Date: 04/22/2021 CLINICAL DATA:  Subarachnoid hemorrhage EXAM: CT HEAD WITHOUT CONTRAST TECHNIQUE: Contiguous axial images were obtained from the base of the skull through the vertex without intravenous contrast. COMPARISON:  Apr 21, 2021 FINDINGS: Brain: There is stable atrophy, asymmetric involving portions of the left frontal and parietal lobes. There is a small amount of subarachnoid hemorrhage in the posterior right frontal lobe, slightly more diffuse but overall likely unchanged in volume compared to 1 day prior. No new foci of subarachnoid hemorrhage appreciable. There is again noted an apparent meningioma in the right occipital region containing a small amount of calcification. This mass measures 3.7 x 3.3 x 3.2 cm, unchanged from 1 day prior lowering for slight differences in scan plane. There is stable mass effect on the atrium of the right lateral ventricle in on the immediate sulci. No new mass evident. No appreciable subdural or epidural fluid collection. No midline shift. There is underlying atrophy with decreased attenuation in portions of the centra semiovale  consistent with periventricular small vessel disease. Stable small infarct in the region of the genu of the right internal capsule again noted. There is evidence of prior infarct in the left cerebellum at the level of the lateral left dentate nucleus. No new infarct compared to 1 day prior. Vascular: No hyperdense vessel. There is calcification in each carotid siphon region. Skull: Bony calvarium appear intact. Sinuses/Orbits: There is mucosal thickening in several ethmoid air cells. Orbits appear symmetric bilaterally. Other: Mastoid air cells on the right are clear. There is opacification in several left-sided mastoid air cells. IMPRESSION: Small amount of hemorrhage in the posterior right frontal lobe persists. No new foci of hemorrhage. Persistent right occipital region meningioma with localized mass effect, similar to 1 day prior. Atrophy with periventricular small vessel disease. Prior infarcts in the lateral dentate nucleus of the cerebellum on the left and in the region of the genu of the right internal capsule. No new infarct compared to 1 day prior evident. There are foci of arterial vascular calcification. There is mucosal thickening in several ethmoid air cells. There is opacification in  several left-sided mastoid air cells. Electronically Signed   By: Lowella Grip III M.D.   On: 04/22/2021 08:09   CT HEAD WO CONTRAST  Result Date: 04/21/2021 CLINICAL DATA:  Head trauma, minor. Neck trauma. Additional history provided: Unwitnessed fall, patient reports low back pain, right hip pain, small laceration noted to back of scalp. History of atrial fibrillation on Eliquis. EXAM: CT HEAD WITHOUT CONTRAST CT CERVICAL SPINE WITHOUT CONTRAST TECHNIQUE: Multidetector CT imaging of the head and cervical spine was performed following the standard protocol without intravenous contrast. Multiplanar CT image reconstructions of the cervical spine were also generated. COMPARISON:  Prior head CT 09/02/2019. Prior  brain MRI 11/29/2018. Cervical spine CT 02/19/2016. FINDINGS: CT HEAD FINDINGS Brain: Mild-to-moderate generalized cerebral atrophy. Small volume acute subarachnoid hemorrhage overlying the right frontal lobe (series 3, image 19) (series 4, image 31) (series 3, image 20). Redemonstrated partially calcified extra-axial dural-based mass overlying the right temporal occipital lobes compatible with meningioma. The mass has increased in size as compared to the prior head CT of 09/02/2019, now measuring 3.5 x 2.7 cm in transaxial (previously 3.0 x 2.4 cm). As before, there is mass effect upon the underlying right temporal occipital lobes with partial effacement of the right lateral ventricle. Mild surrounding vasogenic edema. No midline shift. Chronic lacunar infarct within the right basal ganglia, new from the prior head CT of 09/02/2019). Stable background moderate chronic small vessel ischemic disease within the cerebral white matter. A known chronic lacunar infarct within the left cerebellar hemisphere was better appreciated on the prior brain MRI of 11/29/2018. Unchanged mild extra-axial CSF density prominence overlying the left cerebellar hemisphere. No demarcated cortical infarct. Vascular: Calcifications. Skull: Normal. Negative for fracture or focal lesion. Sinuses/Orbits: Visualized orbits show no acute finding. Minimal bilateral ethmoid sinus mucosal thickening. Other: Trace left mastoid effusion. Right frontotemporal scalp soft tissue swelling CT CERVICAL SPINE FINDINGS Alignment: Straightening of the expected cervical lordosis. No significant spondylolisthesis. Skull base and vertebrae: The basion-dental and atlanto-dental intervals are maintained.No evidence of acute fracture to the cervical spine. Soft tissues and spinal canal: No prevertebral fluid or swelling. No visible canal hematoma. Disc levels: Cervical spondylosis with advanced multilevel disc space narrowing, disc bulges, endplate spurring,  uncovertebral hypertrophy and facet arthrosis. No appreciable high-grade spinal canal stenosis. Multilevel bony neural foraminal narrowing. Upper chest: No consolidation within the imaged lung apices. No visible pneumothorax. Other: 2.4 cm left thyroid lobe nodule. Thyroid ultrasound follow-up is not acquired given the patient's advanced age. These results were called by telephone at the time of interpretation on 04/21/2021 at 8:22 am to provider Dr. Corky Downs, who verbally acknowledged these results. IMPRESSION: CT head: 1. Small volume acute subarachnoid hemorrhage overlying the right frontal lobe. 2. Right frontotemporal scalp soft tissue swelling. 3. A meningioma overlying the right temporal occipital lobes has increased in size as compared to the head CT of 09/02/2019, now measuring 3.5 x 2.7 cm in transaxial dimensions. As before, there is mass effect upon the underlying right temporal occipital lobes with partial effacement of the right lateral ventricle and mild surrounding vasogenic edema. 4. Chronic right basal ganglia lacunar infarct, new from the prior exam. 5. Otherwise stable non-contrast CT appearance of the brain with generalized cerebral atrophy and chronic small vessel ischemic disease with chronic lacunar infarcts, as described. 6. Trace left mastoid effusion. CT cervical spine: 1. No evidence of acute fracture to the cervical spine. 2. Nonspecific straightening of the expected cervical lordosis. 3. Cervical spondylosis, as described Electronically Signed  By: Kellie Simmering DO   On: 04/21/2021 08:24   CT CERVICAL SPINE WO CONTRAST  Result Date: 04/21/2021 CLINICAL DATA:  Head trauma, minor. Neck trauma. Additional history provided: Unwitnessed fall, patient reports low back pain, right hip pain, small laceration noted to back of scalp. History of atrial fibrillation on Eliquis. EXAM: CT HEAD WITHOUT CONTRAST CT CERVICAL SPINE WITHOUT CONTRAST TECHNIQUE: Multidetector CT imaging of the head and  cervical spine was performed following the standard protocol without intravenous contrast. Multiplanar CT image reconstructions of the cervical spine were also generated. COMPARISON:  Prior head CT 09/02/2019. Prior brain MRI 11/29/2018. Cervical spine CT 02/19/2016. FINDINGS: CT HEAD FINDINGS Brain: Mild-to-moderate generalized cerebral atrophy. Small volume acute subarachnoid hemorrhage overlying the right frontal lobe (series 3, image 19) (series 4, image 31) (series 3, image 20). Redemonstrated partially calcified extra-axial dural-based mass overlying the right temporal occipital lobes compatible with meningioma. The mass has increased in size as compared to the prior head CT of 09/02/2019, now measuring 3.5 x 2.7 cm in transaxial (previously 3.0 x 2.4 cm). As before, there is mass effect upon the underlying right temporal occipital lobes with partial effacement of the right lateral ventricle. Mild surrounding vasogenic edema. No midline shift. Chronic lacunar infarct within the right basal ganglia, new from the prior head CT of 09/02/2019). Stable background moderate chronic small vessel ischemic disease within the cerebral white matter. A known chronic lacunar infarct within the left cerebellar hemisphere was better appreciated on the prior brain MRI of 11/29/2018. Unchanged mild extra-axial CSF density prominence overlying the left cerebellar hemisphere. No demarcated cortical infarct. Vascular: Calcifications. Skull: Normal. Negative for fracture or focal lesion. Sinuses/Orbits: Visualized orbits show no acute finding. Minimal bilateral ethmoid sinus mucosal thickening. Other: Trace left mastoid effusion. Right frontotemporal scalp soft tissue swelling CT CERVICAL SPINE FINDINGS Alignment: Straightening of the expected cervical lordosis. No significant spondylolisthesis. Skull base and vertebrae: The basion-dental and atlanto-dental intervals are maintained.No evidence of acute fracture to the cervical  spine. Soft tissues and spinal canal: No prevertebral fluid or swelling. No visible canal hematoma. Disc levels: Cervical spondylosis with advanced multilevel disc space narrowing, disc bulges, endplate spurring, uncovertebral hypertrophy and facet arthrosis. No appreciable high-grade spinal canal stenosis. Multilevel bony neural foraminal narrowing. Upper chest: No consolidation within the imaged lung apices. No visible pneumothorax. Other: 2.4 cm left thyroid lobe nodule. Thyroid ultrasound follow-up is not acquired given the patient's advanced age. These results were called by telephone at the time of interpretation on 04/21/2021 at 8:22 am to provider Dr. Corky Downs, who verbally acknowledged these results. IMPRESSION: CT head: 1. Small volume acute subarachnoid hemorrhage overlying the right frontal lobe. 2. Right frontotemporal scalp soft tissue swelling. 3. A meningioma overlying the right temporal occipital lobes has increased in size as compared to the head CT of 09/02/2019, now measuring 3.5 x 2.7 cm in transaxial dimensions. As before, there is mass effect upon the underlying right temporal occipital lobes with partial effacement of the right lateral ventricle and mild surrounding vasogenic edema. 4. Chronic right basal ganglia lacunar infarct, new from the prior exam. 5. Otherwise stable non-contrast CT appearance of the brain with generalized cerebral atrophy and chronic small vessel ischemic disease with chronic lacunar infarcts, as described. 6. Trace left mastoid effusion. CT cervical spine: 1. No evidence of acute fracture to the cervical spine. 2. Nonspecific straightening of the expected cervical lordosis. 3. Cervical spondylosis, as described Electronically Signed   By: Kellie Simmering DO   On:  04/21/2021 08:24   DG Hip Unilat With Pelvis 2-3 Views Right  Result Date: 04/21/2021 CLINICAL DATA:  Pain after fall. EXAM: DG HIP (WITH OR WITHOUT PELVIS) 2-3V RIGHT COMPARISON:  Radiographs of the right hip  11/26/2018. FINDINGS: Acute, displaced subcapital right femoral neck fracture. No other acute fracture is identified. Bilateral femoroacetabular joint degenerative changes. IMPRESSION: Acute, displaced subcapital right femoral neck fracture. Electronically Signed   By: Kellie Simmering DO   On: 04/21/2021 08:05    Telemetry    Not on tele  Cardiac Studies   Event monitor - 09/2017 NSR Rare episodes of palpitations Longest is 13 sec Would stay on current medications _____________  2D Echocardiogram 12.2018  Study Conclusions   - Left ventricle: The cavity size was normal. Wall thickness was    normal. Systolic function was normal. The estimated ejection    fraction was in the range of 60% to 65%. Left ventricular    diastolic function parameters were normal.  - Aortic valve: There was trivial regurgitation. Valve area (Vmax):    2.76 cm^2.  _____________  Patient Profile     87 y.o. female with a history of PAF on eliquis, PSVT, HTN, and dementia, who was admitted 5/9 following fall w/ R femoral neck fx and subarachnoid hemorrhage.  Assessment & Plan    1.  Right Femoral Neck Fx/preoperative cardiovascular risk evaluation:  Seen 5/9 following fall and R femoral neck fx.  Discussed w/ son in [facility] 5/9.  No c/p or dyspnea.  NSQIP risk of major complication calculates to roughly 6-7%.  No additional cardiac testing required preoperatively.  Eliquis on hold in setting of pending surgery and SAH.  Cont  blocker, dilt, and statin therapy throughout the perioperative period and watch closely for arrhythmias w/ h/o PAF.  2. PAF:  Regular on exam (not on tele).  Cont  blocker and dilt.  No eliquis in setting of #1 and #3.  3.  Subarachnoid hemorrhage:  In setting of fall prior to admission.  Seen by NSU.  CT this AM shows stable SAH.  Eliquis d/c'd and would not plan to restart unless cleared by NSU at some point in the future.  4.  Essential HTN:  Stable this AM on  blocker and  ccb.  Signed, Murray Hodgkins, NP  04/22/2021, 10:38 AM    For questions or updates, please contact   Please consult [email or web address] for contact info under Cardiology/STEMI.

## 2021-04-23 ENCOUNTER — Encounter: Payer: Self-pay | Admitting: Surgery

## 2021-04-23 DIAGNOSIS — Z96649 Presence of unspecified artificial hip joint: Secondary | ICD-10-CM

## 2021-04-23 DIAGNOSIS — E440 Moderate protein-calorie malnutrition: Secondary | ICD-10-CM | POA: Diagnosis present

## 2021-04-23 DIAGNOSIS — I10 Essential (primary) hypertension: Secondary | ICD-10-CM

## 2021-04-23 LAB — BASIC METABOLIC PANEL WITH GFR
Anion gap: 9 (ref 5–15)
BUN: 23 mg/dL (ref 8–23)
CO2: 26 mmol/L (ref 22–32)
Calcium: 8.5 mg/dL — ABNORMAL LOW (ref 8.9–10.3)
Chloride: 103 mmol/L (ref 98–111)
Creatinine, Ser: 0.45 mg/dL (ref 0.44–1.00)
GFR, Estimated: >60 mL/min
Glucose, Bld: 142 mg/dL — ABNORMAL HIGH (ref 70–99)
Potassium: 4.1 mmol/L (ref 3.5–5.1)
Sodium: 138 mmol/L (ref 135–145)

## 2021-04-23 LAB — CBC
HCT: 38 % (ref 36.0–46.0)
Hemoglobin: 12.6 g/dL (ref 12.0–15.0)
MCH: 31 pg (ref 26.0–34.0)
MCHC: 33.2 g/dL (ref 30.0–36.0)
MCV: 93.4 fL (ref 80.0–100.0)
Platelets: 647 K/uL — ABNORMAL HIGH (ref 150–400)
RBC: 4.07 MIL/uL (ref 3.87–5.11)
RDW: 12.2 % (ref 11.5–15.5)
WBC: 19.1 K/uL — ABNORMAL HIGH (ref 4.0–10.5)
nRBC: 0 % (ref 0.0–0.2)

## 2021-04-23 NOTE — Progress Notes (Signed)
   04/23/21 0512  Assess: MEWS Score  BP (!) 199/89  Pulse Rate (!) 119  Resp 17  SpO2 98 %  Assess: MEWS Score  MEWS Temp 0  MEWS Systolic 0  MEWS Pulse 2  MEWS RR 0  MEWS LOC 0  MEWS Score 2  MEWS Score Color Yellow  Assess: if the MEWS score is Yellow or Red  Were vital signs taken at a resting state? Yes  Focused Assessment No change from prior assessment  Early Detection of Sepsis Score *See Row Information* Low  MEWS guidelines implemented *See Row Information* No, vital signs rechecked  Treat  Pain Scale Faces  Pain Score Asleep  Faces Pain Scale 0  Pain Location Back  Pain Orientation Lower  Pain Intervention(s) Medication (See eMAR)  Breathing 0  Negative Vocalization 1  Facial Expression 1  Body Language 1  Consolability 1  PAINAD Score 4  Complains of Other (Comment) (pain)  Interventions Medication (see MAR)  Take Vital Signs  Increase Vital Sign Frequency  Yellow: Q 2hr X 2 then Q 4hr X 2, if remains yellow, continue Q 4hrs  Escalate  MEWS: Escalate Yellow: discuss with charge nurse/RN and consider discussing with provider and RRT  Notify: Charge Nurse/RN  Name of Charge Nurse/RN Notified Linnise RN  Date Charge Nurse/RN Notified 04/23/21  Time Charge Nurse/RN Notified 0530  Notify: Provider  Provider Name/Title Harlow Mares MD  Date Provider Notified 04/23/21  Time Provider Notified 0530  Notification Type  (secure chat)  Notification Reason Other (Comment) (elevated BP)  Provider response No new orders  Document  Patient Outcome Other (Comment) (Will continue to assess)  Progress note created (see row info) Yes  Assess: SIRS CRITERIA  SIRS Temperature  0  SIRS Pulse 1  SIRS Respirations  0  SIRS WBC 0  SIRS Score Sum  1

## 2021-04-23 NOTE — Progress Notes (Signed)
PROGRESS NOTE    Shelly Padilla  BZJ:[id number] DOB: 22-Apr-1920 DOA: 04/21/2021 PCP: Venia Carbon, MD   Brief Narrative: Taken from H&P Shelly Padilla is a 87 y.o. female with medical history significant of HTN, HLD, A fib on Eliquis, TIA, stroke, dementia, thrombocytosis, brain meningioma, depression, GERD, duodenitis, who presents with fall and right hip pain.  Pt has hx of dementia, her mental status was reportedly at baseline, but still cannot provide accurate medical history, therefore, most of the history is obtained by discussing the case with ED physician, per EMS report, and with the nursing staff.  History is limited.  Per report, patient had unwitnessed fall in facility.  She complains of lower back pain and right hip pain.  She was found to have acute, displaced subcapital right femoral neck fracture.  CT head with a small subarachnoid hemorrhage and chronic meningioma with some mass-effect.  Neurosurgery was consulted and they were recommending conservative management, repeat CT head today was without any new changes. Orthopedic was consulted and patient's son decided to proceed with surgery, patient underwent right hemiarthroplasty of hip yesterday.  Tolerated the procedure well.  Subjective: Patient appears little confused, very hard of hearing, continue to apologize that she does not remember anything.  Denies any pain.  Son at bedside.  She was able to recognize son.  Assessment & Plan:   Principal Problem:   Closed right hip fracture (HCC) Active Problems:   HTN (hypertension)   Thrombocytosis   TIA (transient ischemic attack)   Atrial fibrillation, chronic (HCC)   Fall   SAH (subarachnoid hemorrhage) (HCC)   Leukocytosis   GERD (gastroesophageal reflux disease)   HLD (hyperlipidemia)   Depression   Paroxysmal A-fib (HCC)   Malnutrition of moderate degree  Closed right hip fracture.  X-ray shows acute, displaced subcapital right femoral neck  fracture. Patient is high risk for surgery secondary to recent subarachnoid hemorrhage and advanced age. Orthopedic discussed with son and decided to proceed with right hemiarthroplasty -patient tolerated the procedure well. -PT/OT is recommending SNF placement.  Patient can go back to [facility] in a day or so if remains stable. -Continue with pain management -High risk for mortality due to advanced age and recent hip fracture along with a small subarachnoid hemorrhage. -Orthopedics is recommending 2 weeks of Lovenox and 6 weeks of TED hose for DVT prophylaxis.  Small subarachnoid hemorrhage.  Secondary to fall, patient was on Eliquis.  Repeat CT head this morning was without any progression.  Neurosurgery would like to manage conservatively.  No neurologic deficit. -Continue to monitor -Keep holding Eliquis for next couple of weeks at least.  Paroxysmal atrial fibrillation. -Continue metoprolol -Keep holding Eliquis due to recent small subarachnoid hemorrhage, patient is high risk for fall secondary to age and other comorbidities, will need a discussion with her cardiologist regarding continuation of anticoagulation.  Hypertension.  Blood pressure within goal. -Continue ramipril and metoprolol. -Continue with as needed hydralazine  Thrombocytosis: This is chronic issue.  Platelet 647 today. -Follow-up by CBC  TIA (transient ischemic attack) and history of stroke -Continue Lipitor -hold Eliquis   Leukocytosis:  Improving, WBC 17.5 >>12.5>>19.1 no fever.  No source of infection identified.  Likely reactive with some increase postsurgically.  UA with only mild proteinuria. -Continue to monitor  GERD (gastroesophageal reflux disease) -Pepcid  Dementia: -[place]amenda  HLD (hyperlipidemia) -Lipitor  Objective: Vitals:   04/23/21 0512 04/23/21 0733 04/23/21 1125 04/23/21 1453  BP: (!) 199/89 (!) 165/77 (!) 145/76 (!) 162/72  Pulse: (!) 119 83 94 74  Resp: 17 16 15 16   Temp:  98.1  F (36.7 C) 97.8 F (36.6 C) (!) 97.4 F (36.3 C)  TempSrc:  Oral  Oral  SpO2: 98% 100% 98% 99%    Intake/Output Summary (Last 24 hours) at 04/23/2021 1532 Last data filed at 04/23/2021 0437 Gross per 24 hour  Intake 1205.83 ml  Output 440 ml  Net 765.83 ml   There were no vitals filed for this visit.  Examination:  General.  Frail, hard of hearing elderly lady, in no acute distress. Pulmonary.  Lungs clear bilaterally, normal respiratory effort. CV.  Regular rate and rhythm, no JVD, rub or murmur. Abdomen.  Soft, nontender, nondistended, BS positive. CNS.  Alert and oriented to self only.  No focal neurologic deficit. Extremities.  No edema, no cyanosis, pulses intact and symmetrical. Psychiatry.  Judgment and insight appears impaired.  DVT prophylaxis: SCDs, patient has traumatic small subarachnoid hemorrhage Code Status: DNR Family Communication: Son Shelly Padilla was updated at bedside. Disposition Plan:  Status is: Inpatient  Remains inpatient appropriate because:Inpatient level of care appropriate due to severity of illness   Dispo: The patient is from: SNF              Anticipated d/c is to: SNF              Patient currently is not medically stable to d/c.  Patient can go back to [facility], most likely tomorrow if remains stable.   Difficult to place patient No               Level of care: Med-Surg  All the records are reviewed and case discussed with Care Management/Social Worker. Management plans discussed with the patient, nursing and they are in agreement.  Consultants:   Orthopedic surgery  Neurosurgery  Cardiology  Procedures:  Antimicrobials:   Data Reviewed: I have personally reviewed following labs and imaging studies  CBC: Recent Labs  Lab 04/21/21 0637 04/22/21 0442 04/23/21 0457  WBC 17.3* 17.8* 19.1*  NEUTROABS 14.6*  --   --   HGB 15.2* 12.5 12.6  HCT 46.0 38.3 38.0  MCV 91.1 90.8 93.4  PLT 833* 743* [id number]*   Basic Metabolic  Panel: Recent Labs  Lab 04/21/21 0637 04/22/21 0442 04/23/21 0457  NA 134* 136 138  K 3.6 4.1 4.1  CL 96* 100 103  CO2 28 26 26   GLUCOSE 138* 115* 142*  BUN 16 24* 23  CREATININE 0.38* 0.55 0.45  CALCIUM 8.9 8.6* 8.5*   GFR: CrCl cannot be calculated (Unknown ideal weight.). Liver Function Tests: No results for input(s): AST, ALT, ALKPHOS, BILITOT, PROT, ALBUMIN in the last 168 hours. No results for input(s): LIPASE, AMYLASE in the last 168 hours. No results for input(s): AMMONIA in the last 168 hours. Coagulation Profile: Recent Labs  Lab 04/21/21 0637  INR 1.1   Cardiac Enzymes: No results for input(s): CKTOTAL, CKMB, CKMBINDEX, TROPONINI in the last 168 hours. BNP (last 3 results) No results for input(s): PROBNP in the last 8760 hours. HbA1C: No results for input(s): HGBA1C in the last 72 hours. CBG: No results for input(s): GLUCAP in the last 168 hours. Lipid Profile: No results for input(s): CHOL, HDL, LDLCALC, TRIG, CHOLHDL, LDLDIRECT in the last 72 hours. Thyroid Function Tests: No results for input(s): TSH, T4TOTAL, FREET4, T3FREE, THYROIDAB in the last 72 hours. Anemia Panel: No results for input(s): VITAMINB12, FOLATE, FERRITIN, TIBC, IRON, RETICCTPCT in the last 72  hours. Sepsis Labs: No results for input(s): PROCALCITON, LATICACIDVEN in the last 168 hours.  Recent Results (from the past 240 hour(s))  Resp Panel by RT-PCR (Flu A&B, Covid) Nasopharyngeal Swab     Status: None   Collection Time: 04/21/21  6:37 AM   Specimen: Nasopharyngeal Swab; Nasopharyngeal(NP) swabs in vial transport medium  Result Value Ref Range Status   SARS Coronavirus 2 by RT PCR NEGATIVE NEGATIVE Final    Comment: (NOTE) SARS-CoV-2 target nucleic acids are NOT DETECTED.  The SARS-CoV-2 RNA is generally detectable in upper respiratory specimens during the acute phase of infection. The lowest concentration of SARS-CoV-2 viral copies this assay can detect is 138 copies/mL. A  negative result does not preclude SARS-Cov-2 infection and should not be used as the sole basis for treatment or other patient management decisions. A negative result may occur with  improper specimen collection/handling, submission of specimen other than nasopharyngeal swab, presence of viral mutation(s) within the areas targeted by this assay, and inadequate number of viral copies(<138 copies/mL). A negative result must be combined with clinical observations, patient history, and epidemiological information. The expected result is Negative.  Fact Sheet for Patients:  EntrepreneurPulse.com.au  Fact Sheet for Healthcare Providers:  IncredibleEmployment.be  This test is no t yet approved or cleared by the [place] FDA and  has been authorized for detection and/or diagnosis of SARS-CoV-2 by FDA under an Emergency Use Authorization (EUA). This EUA will remain  in effect (meaning this test can be used) for the duration of the COVID-19 declaration under Section 564(b)(1) of the Act, 21 U.S.C.section 360bbb-3(b)(1), unless the authorization is terminated  or revoked sooner.       Influenza A by PCR NEGATIVE NEGATIVE Final   Influenza B by PCR NEGATIVE NEGATIVE Final    Comment: (NOTE) The Xpert Xpress SARS-CoV-2/FLU/RSV plus assay is intended as an aid in the diagnosis of influenza from Nasopharyngeal swab specimens and should not be used as a sole basis for treatment. Nasal washings and aspirates are unacceptable for Xpert Xpress SARS-CoV-2/FLU/RSV testing.  Fact Sheet for Patients: EntrepreneurPulse.com.au  Fact Sheet for Healthcare Providers: IncredibleEmployment.be  This test is not yet approved or cleared by the [place] FDA and has been authorized for detection and/or diagnosis of SARS-CoV-2 by FDA under an Emergency Use Authorization (EUA). This EUA will remain in effect (meaning this test can  be used) for the duration of the COVID-19 declaration under Section 564(b)(1) of the Act, 21 U.S.C. section 360bbb-3(b)(1), unless the authorization is terminated or revoked.  Performed at [facility], [address], [place], [place] [address]   Surgical PCR screen     Status: None   Collection Time: 04/22/21  1:47 AM   Specimen: Nasal Mucosa; Nasal Swab  Result Value Ref Range Status   MRSA, PCR NEGATIVE NEGATIVE Final   Staphylococcus aureus NEGATIVE NEGATIVE Final    Comment: (NOTE) The Xpert SA Assay (FDA approved for NASAL specimens in patients 73 years of age and older), is one component of a comprehensive surveillance program. It is not intended to diagnose infection nor to guide or monitor treatment. Performed at [facility], [address] [place], [place] [address]      Radiology Studies: CT HEAD WO CONTRAST  Result Date: 04/22/2021 CLINICAL DATA:  Subarachnoid hemorrhage EXAM: CT HEAD WITHOUT CONTRAST TECHNIQUE: Contiguous axial images were obtained from the base of the skull through the vertex without intravenous contrast. COMPARISON:  Apr 21, 2021 FINDINGS: Brain: There is  stable atrophy, asymmetric involving portions of the left frontal and parietal lobes. There is a small amount of subarachnoid hemorrhage in the posterior right frontal lobe, slightly more diffuse but overall likely unchanged in volume compared to 1 day prior. No new foci of subarachnoid hemorrhage appreciable. There is again noted an apparent meningioma in the right occipital region containing a small amount of calcification. This mass measures 3.7 x 3.3 x 3.2 cm, unchanged from 1 day prior lowering for slight differences in scan plane. There is stable mass effect on the atrium of the right lateral ventricle in on the immediate sulci. No new mass evident. No appreciable subdural or epidural fluid collection. No midline shift. There is underlying atrophy with decreased attenuation in  portions of the centra semiovale consistent with periventricular small vessel disease. Stable small infarct in the region of the genu of the right internal capsule again noted. There is evidence of prior infarct in the left cerebellum at the level of the lateral left dentate nucleus. No new infarct compared to 1 day prior. Vascular: No hyperdense vessel. There is calcification in each carotid siphon region. Skull: Bony calvarium appear intact. Sinuses/Orbits: There is mucosal thickening in several ethmoid air cells. Orbits appear symmetric bilaterally. Other: Mastoid air cells on the right are clear. There is opacification in several left-sided mastoid air cells. IMPRESSION: Small amount of hemorrhage in the posterior right frontal lobe persists. No new foci of hemorrhage. Persistent right occipital region meningioma with localized mass effect, similar to 1 day prior. Atrophy with periventricular small vessel disease. Prior infarcts in the lateral dentate nucleus of the cerebellum on the left and in the region of the genu of the right internal capsule. No new infarct compared to 1 day prior evident. There are foci of arterial vascular calcification. There is mucosal thickening in several ethmoid air cells. There is opacification in several left-sided mastoid air cells. Electronically Signed   By: Lowella Grip III M.D.   On: 04/22/2021 08:09   DG HIP UNILAT W OR W/O PELVIS 2-3 VIEWS RIGHT  Result Date: 04/22/2021 CLINICAL DATA:  Postop. EXAM: DG HIP (WITH OR WITHOUT PELVIS) 2-3V RIGHT COMPARISON:  Hip radiograph yesterday. FINDINGS: Unipolar right hip arthroplasty in expected alignment. No periprosthetic lucency or fracture. Recent postsurgical change includes air and edema in the soft tissues and lateral skin staples. Exam is otherwise unchanged. IMPRESSION: Right hip arthroplasty without immediate postoperative complication. Electronically Signed   By: Keith Rake M.D.   On: 04/22/2021 17:54     Scheduled Meds: . acetaminophen  1,000 mg Oral Q6H  . atorvastatin  10 mg Oral Daily  . Chlorhexidine Gluconate Cloth  6 each Topical Daily  . diltiazem  120 mg Oral Daily  . docusate sodium  100 mg Oral BID  . enoxaparin (LOVENOX) injection  40 mg Subcutaneous Q24H  . escitalopram  10 mg Oral Once per day on Sun Tue Thu Sat  . escitalopram  5 mg Oral Once per day on Mon Wed Fri  . famotidine  20 mg Oral QHS  . memantine  5 mg Oral BID  . metoprolol succinate  25 mg Oral Daily  . multivitamin with minerals  1 tablet Oral Daily  . nystatin ointment  1 application Topical BID  . polyethylene glycol  17 g Oral Daily  . ramipril  10 mg Oral Daily  . timolol  1 drop Both Eyes QPM   Continuous Infusions: . sodium chloride 50 mL/hr at 04/23/21 0309  .  promethazine (PHENERGAN) injection (IM or IVPB) Stopped (04/21/21 2015)     LOS: 2 days   Time spent: 35 minutes. More than 50% of the time was spent in counseling/coordination of care  Lorella Nimrod, MD [facility]  If 7PM-7AM, please contact night-coverage [email or web address]  04/23/2021, 3:32 PM   This record has been created using Systems analyst. Errors have been sought and corrected,but may not always be located. Such creation errors do not reflect on the standard of care.

## 2021-04-23 NOTE — Progress Notes (Signed)
   Subjective: 1 Day Post-Op Procedure(s) (LRB): ARTHROPLASTY BIPOLAR HIP (HEMIARTHROPLASTY) (Right) Patient sleeping. Easily awakened. States she is in no pain. Confused. Patient is well, and has had no acute complaints or problems Denies any CP, SOB, ABD pain. We will continue therapy today.    Objective: Vital signs in last 24 hours: Temp:  [97.7 F (36.5 C)-98.6 F (37 C)] 98.1 F (36.7 C) (05/11 0733) Pulse Rate:  [77-119] 83 (05/11 0733) Resp:  [13-19] 16 (05/11 0733) BP: (150-199)/(56-97) 165/77 (05/11 0733) SpO2:  [91 %-100 %] 100 % (05/11 0733)  Intake/Output from previous day: 05/10 0701 - 05/11 0700 In: 1205.8 [I.V.:1105.8; IV Piggyback:100] Out: 440 [Urine:390; Blood:50] Intake/Output this shift: No intake/output data recorded.  Recent Labs    04/21/21 0637 04/22/21 0442 04/23/21 0457  HGB 15.2* 12.5 12.6   Recent Labs    04/22/21 0442 04/23/21 0457  WBC 17.8* 19.1*  RBC 4.22 4.07  HCT 38.3 38.0  PLT 743* 647*   Recent Labs    04/22/21 0442 04/23/21 0457  NA 136 138  K 4.1 4.1  CL 100 103  CO2 26 26  BUN 24* 23  CREATININE 0.55 0.45  GLUCOSE 115* 142*  CALCIUM 8.6* 8.5*   Recent Labs    04/21/21 0637  INR 1.1    EXAM General - Patient is Alert and Confused Extremity - Neurovascular intact Sensation intact distally Intact pulses distally Dorsiflexion/Plantar flexion intact No cellulitis present Compartment soft Dressing - dressing C/D/I and no drainage Motor Function - intact, moving foot and toes well on exam.   Past Medical History:  Diagnosis Date  . Brain tumor ([place])    meningioma  . Glaucoma   . H/O paroxysmal supraventricular tachycardia    documented by Holter Monitor  . Hemorrhoids   . Hiatal hernia   . History of echocardiogram    a. 11/2017 Echo: EF 60-65%, no rwma, triv AI.  [place] Kitchen History of ovarian cyst   . HTN (hypertension)   . Insomnia   . PAF (paroxysmal atrial fibrillation) (HCC)    a. CHA2DS2VASc =  4-->Eliquis.  . Palpitations   . Thrombocytosis     Assessment/Plan:   1 Day Post-Op Procedure(s) (LRB): ARTHROPLASTY BIPOLAR HIP (HEMIARTHROPLASTY) (Right) Principal Problem:   Closed right hip fracture (HCC) Active Problems:   HTN (hypertension)   Thrombocytosis   TIA (transient ischemic attack)   Atrial fibrillation, chronic ([place])   Fall   SAH (subarachnoid hemorrhage) (HCC)   Leukocytosis   GERD (gastroesophageal reflux disease)   HLD (hyperlipidemia)   Depression   Paroxysmal A-fib (HCC)  Estimated body mass index is 22.44 kg/m as calculated from the following:   Height as of 09/02/19: 5\' 7"  (1.702 m).   Weight as of 09/02/19: 65 kg. Advance diet Up with therapy  Work on [employer] and VSS Pain controlled CM to assist with discharge to SNF  Follow up with [place] [facility] ortho in 2 weeks Lovenox 40 mg SQ daily x 14 days at discharge TED hose BLE x 6 weeks  DVT Prophylaxis - Lovenox, Foot Pumps and TED hose Weight-Bearing as tolerated to right leg   T. Rachelle Hora, PA-C [place] 04/23/2021, 7:57 AM

## 2021-04-23 NOTE — Evaluation (Signed)
Physical Therapy Evaluation Patient Details Name: Shelly Padilla MRN: [id number] DOB: 02/05/1920 Today's Date: 04/23/2021   History of Present Illness  Pt is a 87 y.o. female with medical history significant of HTN, HLD, A fib on Eliquis, TIA, stroke, dementia, thrombocytosis, brain meningioma, depression, GERD, duodenitis, who presents with fall and right hip pain.  Pt diagnosed with R femoral neck fracture and is s/p hemiarthroplasty.  MD assessment also includes:  small subarachnoid hemorrhage secondary to the fall and leukocytosis.    Clinical Impression  Pt alert to self only and was quite limited by pain during the session.  Pt required +2 total assist with bed mobility tasks and +2 min to mod A to come to standing from an elevated surface.  Once in standing the pt was able to slightly move her RLE a few inches forwards/backwards but could not advance her LLE.  Pt was in NAD while at rest but moaned and grimaced frequently with any movement of her RLE.  Will make pt frequency 7x/wk but will monitor for possible adjustment as appropriate during future sessions.  Pt will benefit from PT services in a SNF setting upon discharge to safely address deficits listed in patient problem list for decreased caregiver assistance and eventual return to PLOF.     Follow Up Recommendations SNF;Supervision/Assistance - 24 hour    Equipment Recommendations  Other (comment) (TBD at next venue of care)    Recommendations for Other Services       Precautions / Restrictions Precautions Precautions: Posterior Hip Precaution Booklet Issued: Yes (comment) Restrictions Weight Bearing Restrictions: Yes RLE Weight Bearing: Weight bearing as tolerated      Mobility  Bed Mobility Overal bed mobility: Needs Assistance Bed Mobility: Sit to Supine;Supine to Sit     Supine to sit: +2 for physical assistance;Total assist Sit to supine: Total assist;+2 for physical assistance   General bed mobility  comments: Pt provided no effort during bed mobility tasks    Transfers Overall transfer level: Needs assistance Equipment used: Rolling walker (2 wheeled) Transfers: Sit to/from Stand Sit to Stand: +2 safety/equipment;Min assist;Mod assist;From elevated surface         General transfer comment: Max verbal cues for sequencing for post hip precaution compliance  Ambulation/Gait             General Gait Details: Pt able to move R foot minimally while in standing but unable to advance the LLE  Stairs            Wheelchair Mobility    Modified Rankin (Stroke Patients Only)       Balance Overall balance assessment: Needs assistance Sitting-balance support: Bilateral upper extremity supported;Feet supported Sitting balance-Leahy Scale: Fair       Standing balance-Leahy Scale: Poor Standing balance comment: Min A to prevent posterior LOB in standing                             Pertinent Vitals/Pain Pain Assessment: Faces Faces Pain Scale: Hurts whole lot Pain Location: R hip Pain Descriptors / Indicators: Grimacing;Moaning Pain Intervention(s): Repositioned;Premedicated before session;Monitored during session    Home Living Family/patient expects to be discharged to:: Assisted living                 Additional Comments: Per chart review, pt resident at [facility], pt unable to provide any history, A&O to self only    Prior Function  Comments: Unknown     Hand Dominance        Extremity/Trunk Assessment   Upper Extremity Assessment Upper Extremity Assessment: Generalized weakness    Lower Extremity Assessment Lower Extremity Assessment: Generalized weakness;RLE deficits/detail RLE: Unable to fully assess due to pain       Communication   Communication: HOH  Cognition Arousal/Alertness: Awake/alert Behavior During Therapy: Flat affect Overall Cognitive Status: No family/caregiver present to determine baseline  cognitive functioning                                 General Comments: Pt A&O to self only      General Comments      Exercises Total Joint Exercises Ankle Circles/Pumps: AAROM;PROM;Both;10 reps Hip ABduction/ADduction: PROM;AAROM;Both;5 reps Straight Leg Raises: PROM;AAROM;Both;5 reps Other Exercises Other Exercises: Static unsupported sitting at EOB for sitting balance and improved activity tolerance   Assessment/Plan    PT Assessment Patient needs continued PT services  PT Problem List Decreased strength;Decreased activity tolerance;Decreased balance;Decreased mobility;Decreased knowledge of use of DME;Pain;Decreased knowledge of precautions       PT Treatment Interventions DME instruction;Gait training;Functional mobility training;Therapeutic activities;Therapeutic exercise;Balance training;Patient/family education    PT Goals (Current goals can be found in the Care Plan section)  Acute Rehab PT Goals PT Goal Formulation: Patient unable to participate in goal setting Time For Goal Achievement: 05/06/21 Potential to Achieve Goals: Fair    Frequency 7X/week   Barriers to discharge        Co-evaluation               AM-PAC PT "6 Clicks" Mobility  Outcome Measure Help needed turning from your back to your side while in a flat bed without using bedrails?: Total Help needed moving from lying on your back to sitting on the side of a flat bed without using bedrails?: Total Help needed moving to and from a bed to a chair (including a wheelchair)?: Total Help needed standing up from a chair using your arms (e.g., wheelchair or bedside chair)?: A Lot Help needed to walk in hospital room?: Total Help needed climbing 3-5 steps with a railing? : Total 6 Click Score: 7    End of Session Equipment Utilized During Treatment: Gait belt Activity Tolerance: Patient limited by pain Patient left: in bed;with call bell/phone within reach;with bed alarm set;with  SCD's reapplied;Other (comment) (Pillows placed to float heels and prevent R hip Adduction) Nurse Communication: Mobility status;Weight bearing status;Precautions PT Visit Diagnosis: History of falling (Z91.81);Unsteadiness on feet (R26.81);Other abnormalities of gait and mobility (R26.89);Muscle weakness (generalized) (M62.81);Pain Pain - Right/Left: Right Pain - part of body: Hip    Time: 6606-3016 PT Time Calculation (min) (ACUTE ONLY): 35 min   Charges:   PT Evaluation $PT Eval Moderate Complexity: 1 Mod PT Treatments $Therapeutic Activity: 8-22 mins        D. Scott Io Dieujuste PT, DPT 04/23/21, 12:01 PM

## 2021-04-23 NOTE — TOC Progression Note (Signed)
Transition of Care [facility]) - Progression Note    Patient Details  Name: Shelly Padilla MRN: [id number] Date of Birth: 02-16-1920  Transition of Care [facility]) CM/SW Violet, RN Phone Number: 04/23/2021, 2:18 PM  Clinical Narrative:   TOC in to see patient and son at bedside.  Patient oriented to self only.  Current resident of [facility].  Son would like patient to return to [facility] if possible.  TOC spoke with Seth Bake who confirms patient can return to [facility] upon discharge. Son has no further questions or concerns at this time.  TOC contact information given, TOC to follow through discharge.    Expected Discharge Plan: [place] Barriers to Discharge: Continued Medical Work up  Expected Discharge Plan and Services Expected Discharge Plan: [place]   Discharge Planning Services: CM Consult Post Acute Care Choice: [place] Living arrangements for the past 2 months: [place]                 DME Arranged:  (SNF resident)         HH Arranged:  (SNF resident)           Social Determinants of Health (SDOH) Interventions    Readmission Risk Interventions No flowsheet data found.

## 2021-04-23 NOTE — NC FL2 (Signed)
[place] LEVEL OF CARE SCREENING TOOL     IDENTIFICATION  Patient Name: Shelly Padilla Birthdate: [birth year] Sex: female Admission Date (Current Location): 04/21/2021  [place] and [place] Number:  Engineering geologist and Address:  [facility], [address] [place], [place] [address]      Provider Number: [id number]  Attending Physician Name and Address:  Lorella Nimrod, MD  Relative Name and Phone Number:  Jaiah, Weigel [facility])   [phone] [facility])    Current Level of Care: Hospital Recommended Level of Care: [place] Prior Approval Number:    Date Approved/Denied:   PASRR Number: [id number]  Discharge Plan: SNF    Current Diagnoses: Patient Active Problem List   Diagnosis Date Noted  . Malnutrition of moderate degree 04/23/2021  . Paroxysmal A-fib ([place])   . Closed right hip fracture ([place]) 04/21/2021  . Fall 04/21/2021  . SAH (subarachnoid hemorrhage) ([place]) 04/21/2021  . Leukocytosis 04/21/2021  . GERD (gastroesophageal reflux disease) 04/21/2021  . HLD (hyperlipidemia) 04/21/2021  . Depression 04/21/2021  . Acute CVA (cerebrovascular accident) ([place]) 04/27/2019  . Altered mental status 11/27/2018  . Atrial fibrillation, chronic ([place]) 11/27/2018  . AMS (altered mental status) 11/27/2018  . MDD (major depressive disorder), single episode, moderate ([place]) 09/08/2018  . Duodenitis 07/14/2018  . 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 Carotid artery disease ([place]) 03/03/2018  . TIA (transient ischemic attack) 11/26/2017  . Chronic venous insufficiency 06/09/2016  . Right inguinal hernia 06/09/2016  . DD (diverticular disease) 04/10/2016  . Glaucoma 04/10/2016  . Hemorrhoid 04/10/2016  . Bilateral ovarian cysts 03/18/2016  . Thrombocytosis   . Preventative health care 06/28/2015  . Varicose veins 01/31/2015  . Advance directive discussed with patient 12/26/2014  . Meningioma ([place]) 10/01/2014  . Insomnia 05/18/2014   . Episodic mood disorder ([place]) 10/18/2012  . Paroxysmal supraventricular tachycardia ([place]) 03/08/2012  . HTN (hypertension) 03/08/2012    Orientation RESPIRATION BLADDER Height & Weight     Self  Normal,O2 Incontinent,External catheter Weight:   Height:     BEHAVIORAL SYMPTOMS/MOOD NEUROLOGICAL BOWEL NUTRITION STATUS   (Oriented to self only)   Incontinent,Continent Diet (Diet Heart healthy/carb modified)  AMBULATORY STATUS COMMUNICATION OF NEEDS Skin   Extensive Assist Verbally Surgical wounds,Other (Comment) (R Hip surgical with honeycomb dressing, Sacral redness, rash/abrasion to scalp)                       Personal Care Assistance Level of Assistance  Bathing,Feeding,Dressing Bathing Assistance: Maximum assistance Feeding assistance: Maximum assistance Dressing Assistance: Maximum assistance     Functional Limitations Info  Sight,Hearing,Speech Sight Info: Adequate Hearing Info: Adequate Speech Info: Adequate    SPECIAL CARE FACTORS FREQUENCY  PT (By licensed PT),OT (By licensed OT)     PT Frequency: as indicated/5x weekly OT Frequency: as indicated/5x weekly            Contractures Contractures Info: Not present    Additional Factors Info  Code Status,Allergies Code Status Info: DNR Allergies Info: Amoxil (amoxicillin),  Codeine,  Pacerone  (amiodarone Hcl),  Sulfa Drugs Cross Reactors, Amlodipine           Current Medications (04/23/2021):  This is the current hospital active medication list Current Facility-Administered Medications  Medication Dose Route Frequency Provider Last Rate Last Admin  . 0.9 %  sodium chloride infusion   Intravenous Continuous Poggi, Marshall Cork, MD 50 mL/hr at 04/23/21 0309 Infusion Verify at 04/23/21 0309  . acetaminophen (TYLENOL)  tablet 1,000 mg  1,000 mg Oral Q6H Poggi, Marshall Cork, MD   1,000 mg at 04/23/21 1002  . acetaminophen (TYLENOL) tablet 650 mg  650 mg Oral Q6H PRN Poggi, Marshall Cork, MD      . atorvastatin (LIPITOR)  tablet 10 mg  10 mg Oral Daily Poggi, Marshall Cork, MD   10 mg at 04/23/21 1002  . bisacodyl (DULCOLAX) suppository 10 mg  10 mg Rectal Daily PRN Poggi, Marshall Cork, MD      . Chlorhexidine Gluconate Cloth 2 % PADS 6 each  6 each Topical Daily Lorella Nimrod, MD   6 each at 04/23/21 1007  . diltiazem (CARDIZEM CD) 24 hr capsule 120 mg  120 mg Oral Daily Poggi, Marshall Cork, MD   120 mg at 04/23/21 1000  . diphenhydrAMINE (BENADRYL) 12.5 MG/5ML elixir 12.5-25 mg  12.5-25 mg Oral Q4H PRN Poggi, Marshall Cork, MD      . docusate sodium (COLACE) capsule 100 mg  100 mg Oral BID Poggi, Marshall Cork, MD   100 mg at 04/23/21 1001  . enoxaparin (LOVENOX) injection 40 mg  40 mg Subcutaneous Q24H Poggi, Marshall Cork, MD   40 mg at 04/23/21 0807  . escitalopram (LEXAPRO) tablet 10 mg  10 mg Oral Once per day on Sun Tue Thu Sat Corky Mull, MD      . escitalopram [facility]) tablet 5 mg  5 mg Oral Once per day on Mon Wed Fri Corky Mull, MD   5 mg at 04/23/21 4098  . famotidine (PEPCID) tablet 20 mg  20 mg Oral QHS Poggi, Marshall Cork, MD   20 mg at 04/21/21 2200  . hydrALAZINE (APRESOLINE) injection 5 mg  5 mg Intravenous Q2H PRN Poggi, Marshall Cork, MD   5 mg at 04/23/21 0545  . magnesium hydroxide (MILK OF MAGNESIA) suspension 30 mL  30 mL Oral Daily PRN Poggi, Marshall Cork, MD      . memantine [facility]) tablet 5 mg  5 mg Oral BID Poggi, Marshall Cork, MD   5 mg at 04/23/21 1003  . methocarbamol (ROBAXIN) tablet 500 mg  500 mg Oral Q8H PRN Poggi, Marshall Cork, MD      . metoCLOPramide (REGLAN) tablet 5-10 mg  5-10 mg Oral Q8H PRN Poggi, Marshall Cork, MD       Or  . metoCLOPramide (REGLAN) injection 5-10 mg  5-10 mg Intravenous Q8H PRN Poggi, Marshall Cork, MD      . metoprolol succinate (TOPROL-XL) 24 hr tablet 25 mg  25 mg Oral Daily Poggi, Marshall Cork, MD   25 mg at 04/23/21 1003  . morphine 2 MG/ML injection 0.5 mg  0.5 mg Intravenous Q4H PRN Poggi, Marshall Cork, MD   0.5 mg at 04/23/21 0442  . multivitamin with minerals tablet 1 tablet  1 tablet Oral Daily Poggi, Marshall Cork, MD   1 tablet at  04/23/21 1002  . nystatin ointment (MYCOSTATIN) 1 application  1 application Topical BID Poggi, Marshall Cork, MD   1 application at 11/91/47 1006  . ondansetron (ZOFRAN) tablet 4 mg  4 mg Oral Q6H PRN Poggi, Marshall Cork, MD       Or  . ondansetron (ZOFRAN) injection 4 mg  4 mg Intravenous Q6H PRN Poggi, Marshall Cork, MD      . oxyCODONE (Oxy IR/ROXICODONE) immediate release tablet 5-10 mg  5-10 mg Oral Q4H PRN Poggi, Marshall Cork, MD   5 mg at 04/23/21 8295  . polyethylene glycol (MIRALAX / GLYCOLAX)  packet 17 g  17 g Oral Daily Poggi, Marshall Cork, MD   17 g at 04/23/21 1006  . promethazine (PHENERGAN) 12.5 mg in sodium chloride 0.9 % 50 mL IVPB  12.5 mg Intravenous Q8H PRN Poggi, Marshall Cork, MD   Stopped at 04/21/21 2015  . ramipril (ALTACE) capsule 10 mg  10 mg Oral Daily Poggi, Marshall Cork, MD   10 mg at 04/23/21 1001  . senna-docusate (Senokot-S) tablet 1 tablet  1 tablet Oral BID PRN Poggi, Marshall Cork, MD      . sodium phosphate (FLEET) 7-19 GM/118ML enema 1 enema  1 enema Rectal Once PRN Poggi, Marshall Cork, MD      . timolol (TIMOPTIC) 0.5 % ophthalmic solution 1 drop  1 drop Both Eyes QPM Poggi, Marshall Cork, MD      . traMADol Veat[facility]) tablet 50 mg  50 mg Oral Q6H PRN Poggi, Marshall Cork, MD         Discharge Medications: Please see discharge summary for a list of discharge medications.  Relevant Imaging Results:  Relevant Lab Results:   Additional Information SSN [id number]  Pete Pelt, RN

## 2021-04-24 LAB — CBC
HCT: 40.7 % (ref 36.0–46.0)
Hemoglobin: 13.3 g/dL (ref 12.0–15.0)
MCH: 30 pg (ref 26.0–34.0)
MCHC: 32.7 g/dL (ref 30.0–36.0)
MCV: 91.9 fL (ref 80.0–100.0)
Platelets: 879 10*3/uL — ABNORMAL HIGH (ref 150–400)
RBC: 4.43 MIL/uL (ref 3.87–5.11)
RDW: 12 % (ref 11.5–15.5)
WBC: 25.4 10*3/uL — ABNORMAL HIGH (ref 4.0–10.5)
nRBC: 0 % (ref 0.0–0.2)

## 2021-04-24 LAB — RENAL FUNCTION PANEL
Albumin: 3.6 g/dL (ref 3.5–5.0)
Anion gap: 11 (ref 5–15)
BUN: 18 mg/dL (ref 8–23)
CO2: 26 mmol/L (ref 22–32)
Calcium: 8.6 mg/dL — ABNORMAL LOW (ref 8.9–10.3)
Chloride: 98 mmol/L (ref 98–111)
Creatinine, Ser: 0.39 mg/dL — ABNORMAL LOW (ref 0.44–1.00)
GFR, Estimated: >60 mL/min (ref >60)
Glucose, Bld: 138 mg/dL — ABNORMAL HIGH (ref 70–99)
Phosphorus: 1.9 mg/dL — ABNORMAL LOW (ref 2.5–4.6)
Potassium: 3.8 mmol/L (ref 3.5–5.1)
Sodium: 135 mmol/L (ref 135–145)

## 2021-04-24 LAB — SURGICAL PATHOLOGY

## 2021-04-24 LAB — MAGNESIUM: Magnesium: 1.7 mg/dL (ref 1.7–2.4)

## 2021-04-24 NOTE — Progress Notes (Signed)
Physical Therapy Treatment Patient Details Name: Shelly Padilla MRN: [id number] DOB: 03-02-1920 Today's Date: 04/24/2021    History of Present Illness Pt is a 87 y.o. female with medical history significant of HTN, HLD, A fib on Eliquis, TIA, stroke, dementia, thrombocytosis, brain meningioma, depression, GERD, duodenitis, who presents with fall and right hip pain.  Pt diagnosed with R femoral neck fracture and is s/p hemiarthroplasty.  MD assessment also includes:  small subarachnoid hemorrhage secondary to the fall and leukocytosis.    PT Comments    Pt seen around lunch time. Pt's son stated she had been coughing while he assisted her with ice cream. Pt appeared very lethargic requiring constant engagement with clear loud voice due to being [facility].  Pt repositioned in upright sitting in bed, tolerated minimal ranging to R LE.  Once upright sitting attained, pt able to engage in purposeful conversation and recall surgery on R hip.  Pt able to eat 1/2 of mashed potatoes during rest periods with Max vc's to maintain alertness and clear food in mouth.  Pt however did engage well while awake and hopefully will be more alert for tomorrows session in order to progress functional mobility.     Follow Up Recommendations  SNF;Supervision/Assistance - 24 hour     Equipment Recommendations       Recommendations for Other Services       Precautions / Restrictions Precautions Precautions: Posterior Hip Precaution Booklet Issued: Yes (comment) Restrictions Weight Bearing Restrictions: Yes RLE Weight Bearing: Weight bearing as tolerated    Mobility  Bed Mobility Overal bed mobility: Needs Assistance Bed Mobility: Rolling     Supine to sit: +2 for physical assistance;Total assist     General bed mobility comments: Pt provided no effort during bed mobility tasks    Transfers                 General transfer comment:  (Unable to tolerate transfers this visit)  Ambulation/Gait                  Stairs             Wheelchair Mobility    Modified Rankin (Stroke Patients Only)       Balance                                            Cognition Arousal/Alertness: Awake/alert Behavior During Therapy:  (Pleasant, repeated vc's to stay on task) Overall Cognitive Status: Impaired/Different from baseline Area of Impairment: Orientation;Attention;Awareness                 Orientation Level: Disoriented to;Place;Time;Situation Current Attention Level: Alternating (Pt able to recall reason for pain in R LE and situation after several re-education cues)                  Exercises      General Comments        Pertinent Vitals/Pain Pain Assessment: Faces Faces Pain Scale: Hurts whole lot Pain Descriptors / Indicators: Grimacing;Moaning Pain Intervention(s): Monitored during session;Limited activity within patient's tolerance;Repositioned    Home Living                      Prior Function            PT Goals (current goals can now be found in the care plan section) Acute  Rehab PT Goals Patient Stated Goal: get better    Frequency    7X/week      PT Plan Current plan remains appropriate    Co-evaluation              AM-PAC PT "6 Clicks" Mobility   Outcome Measure  Help needed turning from your back to your side while in a flat bed without using bedrails?: Total Help needed moving from lying on your back to sitting on the side of a flat bed without using bedrails?: Total Help needed moving to and from a bed to a chair (including a wheelchair)?: Total Help needed standing up from a chair using your arms (e.g., wheelchair or bedside chair)?: A Lot Help needed to walk in hospital room?: Total Help needed climbing 3-5 steps with a railing? : Total 6 Click Score: 7    End of Session Equipment Utilized During Treatment: Oxygen Activity Tolerance: Patient limited by pain Patient left: in  bed;with call bell/phone within reach;with bed alarm set;with SCD's reapplied;Other (comment) Nurse Communication: Mobility status PT Visit Diagnosis: History of falling (Z91.81);Unsteadiness on feet (R26.81);Other abnormalities of gait and mobility (R26.89);Muscle weakness (generalized) (M62.81);Pain Pain - Right/Left: Right Pain - part of body: Hip     Time: 1230-1300 PT Time Calculation (min) (ACUTE ONLY): 30 min  Charges:  $Therapeutic Activity: 23-37 mins                    Shelly Padilla, PTA    Shelly Padilla 04/24/2021, 2:35 PM

## 2021-04-24 NOTE — Progress Notes (Signed)
PROGRESS NOTE    Shelly Padilla  XBD:[id number] DOB: Jan 21, 1920 DOA: 04/21/2021 PCP: Venia Carbon, MD   Brief Narrative: Taken from H&P Shelly Padilla is a 87 y.o. female with medical history significant of HTN, HLD, A fib on Eliquis, TIA, stroke, dementia, thrombocytosis, brain meningioma, depression, GERD, duodenitis, who presents with fall and right hip pain.  Pt has hx of dementia, her mental status was reportedly at baseline, but still cannot provide accurate medical history, therefore, most of the history is obtained by discussing the case with ED physician, per EMS report, and with the nursing staff.  History is limited.  Per report, patient had unwitnessed fall in facility.  She complains of lower back pain and right hip pain.  She was found to have acute, displaced subcapital right femoral neck fracture.  CT head with a small subarachnoid hemorrhage and chronic meningioma with some mass-effect.  Neurosurgery was consulted and they were recommending conservative management, repeat CT head today was without any new changes. Orthopedic was consulted and patient's son decided to proceed with surgery, patient underwent right hemiarthroplasty of hip yesterday.  Tolerated the procedure well.  Subjective: Pt had pain with movement while PT was working with her.  Noted to be sleepy, but did wake up and responsive.   Assessment & Plan:   Principal Problem:   Closed right hip fracture ([place]) Active Problems:   HTN (hypertension)   Thrombocytosis   TIA (transient ischemic attack)   Atrial fibrillation, chronic (HCC)   Fall   SAH (subarachnoid hemorrhage) (HCC)   Leukocytosis   GERD (gastroesophageal reflux disease)   HLD (hyperlipidemia)   Depression   Paroxysmal A-fib (HCC)   Malnutrition of moderate degree   Status post hip hemiarthroplasty  Closed right hip fracture S/p right hemiarthroplasty on 04/22/21 X-ray shows acute, displaced subcapital right femoral neck  fracture. --s/p surgery with Dr. Roland Rack Plan: -PT/OT is recommending SNF placement.   -Continue with pain management, careful to avoid over-sedation --Follow up with [facility] in 2 weeks Lovenox 40 mg SQ daily x 14 days at discharge TED hose BLE x 6 weeks Weight-Bearing as tolerated to right leg  Small subarachnoid hemorrhage.  Secondary to fall, patient was on Eliquis.  Repeat CT head was without any progression.  Neurosurgery would like to manage conservatively.  No neurologic deficit. Plan: --hold Eliquis for the next couple of weeks at least  Paroxysmal atrial fibrillation. --cont Cardizem and Toprol --Hold Eliquis for now due to subarachnoid hemorrhage --will need a discussion with her cardiologist regarding continuation of anticoagulation given the high risk of falling  Hypertension.   Blood pressure within goal. --cont ramipril and Toprol  Thrombocytosis: This is chronic issue.  Platelet 647 today. --monitor  TIA (transient ischemic attack) and history of stroke --cont lipitor  Leukocytosis:   no fever.  No source of infection identified.  Likely reactive with some increase postsurgically.  UA with only mild proteinuria. --monitor off of abx  GERD (gastroesophageal reflux disease) --cont Pepcid  Dementia: -[place]  HLD (hyperlipidemia) -Lipitor   Objective: Vitals:   04/24/21 0434 04/24/21 0749 04/24/21 1127 04/24/21 1431  BP: (!) 182/86 (!) 149/75 130/69 130/60  Pulse: 93 79 87 82  Resp: 16 14 16 15   Temp: 97.8 F (36.6 C) 98.2 F (36.8 C) 97.8 F (36.6 C) 98.4 F (36.9 C)  TempSrc: Oral Oral Oral Oral  SpO2: 93% 98% 96% 94%    Intake/Output Summary (Last 24 hours) at 04/24/2021 1537 Last data filed at  04/24/2021 1236 Gross per 24 hour  Intake 1707.98 ml  Output 650 ml  Net 1057.98 ml   There were no vitals filed for this visit.  Examination:  Constitutional: NAD, sleepy, but arousable and responsive, cachectic appearing HEENT: conjunctivae  and lids normal, EOMI CV: No cyanosis.   RESP: normal respiratory effort, on RA Extremities: No effusions, edema in BLE SKIN: warm, dry   DVT prophylaxis: SCD/Compression stockings Code Status: DNR  Family Communication:  Status is: inpatient Dispo:   The patient is from: home Anticipated d/c is to: SNF Anticipated d/c date is: 1-2 days Patient currently is not medically stable to d/c due to: WBC still trending up, needs to see it stabilize.    All the records are reviewed and case discussed with Care Management/Social Worker. Management plans discussed with the patient, nursing and they are in agreement.  Consultants:   Orthopedic surgery  Neurosurgery  Cardiology  Procedures:  Antimicrobials:   Data Reviewed: I have personally reviewed following labs and imaging studies  CBC: Recent Labs  Lab 04/21/21 0637 04/22/21 0442 04/23/21 0457 04/24/21 0451  WBC 17.3* 17.8* 19.1* 25.4*  NEUTROABS 14.6*  --   --   --   HGB 15.2* 12.5 12.6 13.3  HCT 46.0 38.3 38.0 40.7  MCV 91.1 90.8 93.4 91.9  PLT 833* 743* 647* 644*   Basic Metabolic Panel: Recent Labs  Lab 04/21/21 0637 04/22/21 0442 04/23/21 0457 04/24/21 0451  NA 134* 136 138 135  K 3.6 4.1 4.1 3.8  CL 96* 100 103 98  CO2 28 26 26 26   GLUCOSE 138* 115* 142* 138*  BUN 16 24* 23 18  CREATININE 0.38* 0.55 0.45 0.39*  CALCIUM 8.9 8.6* 8.5* 8.6*  MG  --   --   --  1.7  PHOS  --   --   --  1.9*   GFR: CrCl cannot be calculated (Unknown ideal weight.). Liver Function Tests: Recent Labs  Lab 04/24/21 0451  ALBUMIN 3.6   No results for input(s): LIPASE, AMYLASE in the last 168 hours. No results for input(s): AMMONIA in the last 168 hours. Coagulation Profile: Recent Labs  Lab 04/21/21 0637  INR 1.1   Cardiac Enzymes: No results for input(s): CKTOTAL, CKMB, CKMBINDEX, TROPONINI in the last 168 hours. BNP (last 3 results) No results for input(s): PROBNP in the last 8760 hours. HbA1C: No results  for input(s): HGBA1C in the last 72 hours. CBG: No results for input(s): GLUCAP in the last 168 hours. Lipid Profile: No results for input(s): CHOL, HDL, LDLCALC, TRIG, CHOLHDL, LDLDIRECT in the last 72 hours. Thyroid Function Tests: No results for input(s): TSH, T4TOTAL, FREET4, T3FREE, THYROIDAB in the last 72 hours. Anemia Panel: No results for input(s): VITAMINB12, FOLATE, FERRITIN, TIBC, IRON, RETICCTPCT in the last 72 hours. Sepsis Labs: No results for input(s): PROCALCITON, LATICACIDVEN in the last 168 hours.  Recent Results (from the past 240 hour(s))  Resp Panel by RT-PCR (Flu A&B, Covid) Nasopharyngeal Swab     Status: None   Collection Time: 04/21/21  6:37 AM   Specimen: Nasopharyngeal Swab; Nasopharyngeal(NP) swabs in vial transport medium  Result Value Ref Range Status   SARS Coronavirus 2 by RT PCR NEGATIVE NEGATIVE Final    Comment: (NOTE) SARS-CoV-2 target nucleic acids are NOT DETECTED.  The SARS-CoV-2 RNA is generally detectable in upper respiratory specimens during the acute phase of infection. The lowest concentration of SARS-CoV-2 viral copies this assay can detect is 138 copies/mL. A negative result  does not preclude SARS-Cov-2 infection and should not be used as the sole basis for treatment or other patient management decisions. A negative result may occur with  improper specimen collection/handling, submission of specimen other than nasopharyngeal swab, presence of viral mutation(s) within the areas targeted by this assay, and inadequate number of viral copies(<138 copies/mL). A negative result must be combined with clinical observations, patient history, and epidemiological information. The expected result is Negative.  Fact Sheet for Patients:  EntrepreneurPulse.com.au  Fact Sheet for Healthcare Providers:  IncredibleEmployment.be  This test is no t yet approved or cleared by the [place] FDA and  has been  authorized for detection and/or diagnosis of SARS-CoV-2 by FDA under an Emergency Use Authorization (EUA). This EUA will remain  in effect (meaning this test can be used) for the duration of the COVID-19 declaration under Section 564(b)(1) of the Act, 21 U.S.C.section 360bbb-3(b)(1), unless the authorization is terminated  or revoked sooner.       Influenza A by PCR NEGATIVE NEGATIVE Final   Influenza B by PCR NEGATIVE NEGATIVE Final    Comment: (NOTE) The Xpert Xpress SARS-CoV-2/FLU/RSV plus assay is intended as an aid in the diagnosis of influenza from Nasopharyngeal swab specimens and should not be used as a sole basis for treatment. Nasal washings and aspirates are unacceptable for Xpert Xpress SARS-CoV-2/FLU/RSV testing.  Fact Sheet for Patients: EntrepreneurPulse.com.au  Fact Sheet for Healthcare Providers: IncredibleEmployment.be  This test is not yet approved or cleared by the [place] FDA and has been authorized for detection and/or diagnosis of SARS-CoV-2 by FDA under an Emergency Use Authorization (EUA). This EUA will remain in effect (meaning this test can be used) for the duration of the COVID-19 declaration under Section 564(b)(1) of the Act, 21 U.S.C. section 360bbb-3(b)(1), unless the authorization is terminated or revoked.  Performed at [facility], [address] [place], [place] [address]   Surgical PCR screen     Status: None   Collection Time: 04/22/21  1:47 AM   Specimen: Nasal Mucosa; Nasal Swab  Result Value Ref Range Status   MRSA, PCR NEGATIVE NEGATIVE Final   Staphylococcus aureus NEGATIVE NEGATIVE Final    Comment: (NOTE) The Xpert SA Assay (FDA approved for NASAL specimens in patients 56 years of age and older), is one component of a comprehensive surveillance program. It is not intended to diagnose infection nor to guide or monitor treatment. Performed at [facility], [address] [place], [place] [address]      Radiology Studies: DG HIP [facility] OR W/O PELVIS 2-3 VIEWS RIGHT  Result Date: 04/22/2021 CLINICAL DATA:  Postop. EXAM: DG HIP (WITH OR WITHOUT PELVIS) 2-3V RIGHT COMPARISON:  Hip radiograph yesterday. FINDINGS: Unipolar right hip arthroplasty in expected alignment. No periprosthetic lucency or fracture. Recent postsurgical change includes air and edema in the soft tissues and lateral skin staples. Exam is otherwise unchanged. IMPRESSION: Right hip arthroplasty without immediate postoperative complication. Electronically Signed   By: Keith Rake M.D.   On: 04/22/2021 17:54    Scheduled Meds: . atorvastatin  10 mg Oral Daily  . Chlorhexidine Gluconate Cloth  6 each Topical Daily  . diltiazem  120 mg Oral Daily  . docusate sodium  100 mg Oral BID  . enoxaparin (LOVENOX) injection  40 mg Subcutaneous Q24H  . escitalopram  10 mg Oral Once per day on Sun Tue Thu Sat  . escitalopram  5 mg Oral Once per day on Mon Wed Fri  .  famotidine  20 mg Oral QHS  . memantine  5 mg Oral BID  . metoprolol succinate  25 mg Oral Daily  . multivitamin with minerals  1 tablet Oral Daily  . nystatin ointment  1 application Topical BID  . polyethylene glycol  17 g Oral Daily  . ramipril  10 mg Oral Daily  . timolol  1 drop Both Eyes QPM   Continuous Infusions: . sodium chloride 50 mL/hr at 04/24/21 1329  . promethazine (PHENERGAN) injection (IM or IVPB) Stopped (04/21/21 2015)     LOS: 3 days   Enzo Bi, MD [facility]  If 7PM-7AM, please contact night-coverage [email or web address]  04/24/2021, 3:37 PM

## 2021-04-24 NOTE — Progress Notes (Addendum)
   Subjective: 2 Days Post-Op Procedure(s) (LRB): ARTHROPLASTY BIPOLAR HIP (HEMIARTHROPLASTY) (Right) Patient sleeping. Easily awakened. Difficult historian. Confused. Denies pain. We will continue therapy today.    Objective: Vital signs in last 24 hours: Temp:  [97.4 F (36.3 C)-98.1 F (36.7 C)] 97.8 F (36.6 C) (05/12 0434) Pulse Rate:  [74-94] 93 (05/12 0434) Resp:  [15-18] 16 (05/12 0434) BP: (145-182)/(72-86) 182/86 (05/12 0434) SpO2:  [93 %-100 %] 93 % (05/12 0434)  Intake/Output from previous day: 05/11 0701 - 05/12 0700 In: 1237.2 [P.O.:120; I.V.:1117.2] Out: 650 [Urine:650] Intake/Output this shift: No intake/output data recorded.  Recent Labs    04/22/21 0442 04/23/21 0457 04/24/21 0451  HGB 12.5 12.6 13.3   Recent Labs    04/23/21 0457 04/24/21 0451  WBC 19.1* 25.4*  RBC 4.07 4.43  HCT 38.0 40.7  PLT 647* 879*   Recent Labs    04/23/21 0457 04/24/21 0451  NA 138 135  K 4.1 3.8  CL 103 98  CO2 26 26  BUN 23 18  CREATININE 0.45 0.39*  GLUCOSE 142* 138*  CALCIUM 8.5* 8.6*   No results for input(s): LABPT, INR in the last 72 hours.  EXAM General - Patient is Alert and Confused Extremity - Neurovascular intact Sensation intact distally Intact pulses distally Dorsiflexion/Plantar flexion intact No cellulitis present Compartment soft Dressing - dressing C/D/I and no drainage Motor Function - intact, moving foot and toes well on exam.   Past Medical History:  Diagnosis Date  . Brain tumor ([place])    meningioma  . Glaucoma   . H/O paroxysmal supraventricular tachycardia    documented by Holter Monitor  . Hemorrhoids   . Hiatal hernia   . History of echocardiogram    a. 11/2017 Echo: EF 60-65%, no rwma, triv AI.  Marland Kitchen History of ovarian cyst   . HTN (hypertension)   . Insomnia   . PAF (paroxysmal atrial fibrillation) (HCC)    a. CHA2DS2VASc = 4-->Eliquis.  . Palpitations   . Thrombocytosis     Assessment/Plan:   2 Days Post-Op  Procedure(s) (LRB): ARTHROPLASTY BIPOLAR HIP (HEMIARTHROPLASTY) (Right) Principal Problem:   Closed right hip fracture (HCC) Active Problems:   HTN (hypertension)   Thrombocytosis   TIA (transient ischemic attack)   Atrial fibrillation, chronic ([place])   Fall   SAH (subarachnoid hemorrhage) (HCC)   Leukocytosis   GERD (gastroesophageal reflux disease)   HLD (hyperlipidemia)   Depression   Paroxysmal A-fib (HCC)   Malnutrition of moderate degree   Status post hip hemiarthroplasty  Estimated body mass index is 22.44 kg/m as calculated from the following:   Height as of 09/02/19: 5\' 7"  (1.702 m).   Weight as of 09/02/19: 65 kg. Advance diet Up with therapy  Work on BM VSS WBC trending up. No signs of infection along surgery site. Continue to monitor Recheck labs in am Pain controlled CM to assist with discharge to SNF  Follow up with [facility] in 2 weeks Lovenox 40 mg SQ daily x 14 days at discharge TED hose BLE x 6 weeks  DVT Prophylaxis - Lovenox, Foot Pumps and TED hose Weight-Bearing as tolerated to right leg   T. Rachelle Hora, PA-C [place] 04/24/2021, 7:17 AM

## 2021-04-24 NOTE — Care Management Important Message (Signed)
Important Message  Patient Details  Name: Shelly Padilla MRN: [id number] Date of Birth: 08/10/1920   Medicare Important Message Given:  Yes     Shelly Padilla 04/24/2021, 10:45 AM

## 2021-04-25 LAB — CBC
HCT: 34.4 % — ABNORMAL LOW (ref 36.0–46.0)
Hemoglobin: 11.1 g/dL — ABNORMAL LOW (ref 12.0–15.0)
MCH: 30.2 pg (ref 26.0–34.0)
MCHC: 32.3 g/dL (ref 30.0–36.0)
MCV: 93.5 fL (ref 80.0–100.0)
Platelets: 719 10*3/uL — ABNORMAL HIGH (ref 150–400)
RBC: 3.68 MIL/uL — ABNORMAL LOW (ref 3.87–5.11)
RDW: 12.2 % (ref 11.5–15.5)
WBC: 25.5 10*3/uL — ABNORMAL HIGH (ref 4.0–10.5)
nRBC: 0 % (ref 0.0–0.2)

## 2021-04-25 LAB — BASIC METABOLIC PANEL
Anion gap: 8 (ref 5–15)
BUN: 23 mg/dL (ref 8–23)
CO2: 28 mmol/L (ref 22–32)
Calcium: 8.4 mg/dL — ABNORMAL LOW (ref 8.9–10.3)
Chloride: 103 mmol/L (ref 98–111)
Creatinine, Ser: 0.39 mg/dL — ABNORMAL LOW (ref 0.44–1.00)
GFR, Estimated: >60 mL/min (ref >60)
Glucose, Bld: 122 mg/dL — ABNORMAL HIGH (ref 70–99)
Potassium: 3.7 mmol/L (ref 3.5–5.1)
Sodium: 139 mmol/L (ref 135–145)

## 2021-04-25 LAB — MAGNESIUM: Magnesium: 1.8 mg/dL (ref 1.7–2.4)

## 2021-04-25 LAB — PHOSPHORUS: Phosphorus: 1.9 mg/dL — ABNORMAL LOW (ref 2.5–4.6)

## 2021-04-25 MED ORDER — SODIUM CHLORIDE 0.9 % IV SOLN: INTRAVENOUS | Status: DC

## 2021-04-25 MED ORDER — ACETAMINOPHEN 500 MG PO TABS: 1000.0000 mg | ORAL_TABLET | Freq: Four times a day (QID) | ORAL | Status: DC | PRN

## 2021-04-25 MED ORDER — ENOXAPARIN SODIUM 40 MG/0.4ML IJ SOSY: 40.0000 mg | PREFILLED_SYRINGE | INTRAMUSCULAR | 0 refills | Status: DC

## 2021-04-25 MED ORDER — TRAMADOL HCL 50 MG PO TABS: 50.0000 mg | ORAL_TABLET | Freq: Four times a day (QID) | ORAL | 0 refills | Status: DC | PRN

## 2021-04-25 NOTE — Progress Notes (Signed)
PT Cancellation Note  Patient Details Name: Shelly Padilla MRN: [id number] DOB: 27-Jul-1920   Cancelled Treatment:     PT attempt. Pt unable to stay awake to participate. Falls asleep mid sentence while attempting to do exercises. Acute PT will continue to follow and progress per POC.    Willette Pa 04/25/2021, 2:29 PM

## 2021-04-25 NOTE — Progress Notes (Signed)
PT Cancellation Note  Patient Details Name: Shelly Padilla MRN: [id number] DOB: Apr 27, 1920   Cancelled Treatment:     Pt was asleep with son present at bedside. Son requested not to wake pt. Will return later and see if pt is alert and appropriate to participate.    Willette Pa 04/25/2021, 12:26 PM

## 2021-04-25 NOTE — Discharge Instructions (Signed)

## 2021-04-25 NOTE — Progress Notes (Signed)
   Subjective: 3 Days Post-Op Procedure(s) (LRB): ARTHROPLASTY BIPOLAR HIP (HEMIARTHROPLASTY) (Right) Patient sleeping.  Comfortable We will continue therapy today.    Objective: Vital signs in last 24 hours: Temp:  [97.8 F (36.6 C)-98.6 F (37 C)] 98.4 F (36.9 C) (05/13 0512) Pulse Rate:  [79-99] 99 (05/13 0512) Resp:  [14-17] 17 (05/13 0512) BP: (130-162)/(60-75) 150/71 (05/13 0512) SpO2:  [91 %-98 %] 92 % (05/13 0512)  Intake/Output from previous day: 05/12 0701 - 05/13 0700 In: 470.8 [I.V.:470.8] Out: 275 [Urine:275] Intake/Output this shift: Total I/O In: -  Out: 275 [Urine:275]  Recent Labs    04/23/21 0457 04/24/21 0451  HGB 12.6 13.3   Recent Labs    04/23/21 0457 04/24/21 0451  WBC 19.1* 25.4*  RBC 4.07 4.43  HCT 38.0 40.7  PLT 647* 879*   Recent Labs    04/23/21 0457 04/24/21 0451  NA 138 135  K 4.1 3.8  CL 103 98  CO2 26 26  BUN 23 18  CREATININE 0.45 0.39*  GLUCOSE 142* 138*  CALCIUM 8.5* 8.6*   No results for input(s): LABPT, INR in the last 72 hours.  EXAM General - Patient is Alert and Confused Extremity - Neurovascular intact Sensation intact distally Intact pulses distally Dorsiflexion/Plantar flexion intact No cellulitis present Compartment soft Dressing - dressing C/D/I and no drainage Motor Function - intact, moving foot and toes well on exam.   Past Medical History:  Diagnosis Date  . Brain tumor ([place])    meningioma  . Glaucoma   . H/O paroxysmal supraventricular tachycardia    documented by Holter Monitor  . Hemorrhoids   . Hiatal hernia   . History of echocardiogram    a. 11/2017 Echo: EF 60-65%, no rwma, triv AI.  Marland Kitchen History of ovarian cyst   . HTN (hypertension)   . Insomnia   . PAF (paroxysmal atrial fibrillation) (HCC)    a. CHA2DS2VASc = 4-->Eliquis.  . Palpitations   . Thrombocytosis     Assessment/Plan:   3 Days Post-Op Procedure(s) (LRB): ARTHROPLASTY BIPOLAR HIP (HEMIARTHROPLASTY)  (Right) Principal Problem:   Closed right hip fracture (HCC) Active Problems:   HTN (hypertension)   Thrombocytosis   TIA (transient ischemic attack)   Atrial fibrillation, chronic ([place])   Fall   SAH (subarachnoid hemorrhage) (HCC)   Leukocytosis   GERD (gastroesophageal reflux disease)   HLD (hyperlipidemia)   Depression   Paroxysmal A-fib (HCC)   Malnutrition of moderate degree   Status post hip hemiarthroplasty  Estimated body mass index is 22.44 kg/m as calculated from the following:   Height as of 09/02/19: 5\' 7"  (1.702 m).   Weight as of 09/02/19: 65 kg. Advance diet Up with therapy  Work on BM VSS WBC trending up. No signs of infection along surgery site. Continue to monitor Pain controlled CM to assist with discharge to SNF  Follow up with [facility] in 2 weeks Lovenox 40 mg SQ daily x 14 days at discharge TED hose BLE x 6 weeks  DVT Prophylaxis - Lovenox, Foot Pumps and TED hose Weight-Bearing as tolerated to right leg   Reche Dixon PA-C [place] 04/25/2021, 6:32 AM

## 2021-04-25 NOTE — Progress Notes (Signed)
PROGRESS NOTE    Angle Doto  [id number] DOB: [birth year] DOA: 04/21/2021 PCP: Venia Carbon, MD   Brief Narrative: Taken from H&P [place] Brunick is a 87 y.o. female with medical history significant of HTN, HLD, A fib on Eliquis, TIA, stroke, dementia, thrombocytosis, brain meningioma, depression, GERD, duodenitis, who presents with fall and right hip pain.  Pt has hx of dementia, her mental status was reportedly at baseline, but still cannot provide accurate medical history, therefore, most of the history is obtained by discussing the case with ED physician, per EMS report, and with the nursing staff.  History is limited.  Per report, patient had unwitnessed fall in facility.  She complains of lower back pain and right hip pain.  She was found to have acute, displaced subcapital right femoral neck fracture.  CT head with a small subarachnoid hemorrhage and chronic meningioma with some mass-effect.  Neurosurgery was consulted and they were recommending conservative management, repeat CT head today was without any new changes. Orthopedic was consulted and patient's son decided to proceed with surgery, patient underwent right hemiarthroplasty of hip yesterday.  Tolerated the procedure well.  Subjective: Pt continued to be very sleepy, said she didn't know what's happening, and didn't think she had dysuria.  Ate some food when fed, but didn't swallow everything in her mouth.   Assessment & Plan:   Principal Problem:   Closed right hip fracture ([place]) Active Problems:   HTN (hypertension)   Thrombocytosis   TIA (transient ischemic attack)   Atrial fibrillation, chronic (HCC)   Fall   SAH (subarachnoid hemorrhage) (HCC)   Leukocytosis   GERD (gastroesophageal reflux disease)   HLD (hyperlipidemia)   Depression   Paroxysmal A-fib (HCC)   Malnutrition of moderate degree   Status post hip hemiarthroplasty  Closed right hip fracture S/p right hemiarthroplasty on 04/22/21 X-ray  shows acute, displaced subcapital right femoral neck fracture. --s/p surgery with Dr. Roland Rack Plan: -PT/OT is recommending SNF placement.   --tramadol and Tylenol PRN for pain, avoid over-sedation --Follow up with [facility] in 2 weeks Lovenox 40 mg SQ daily x 14 days at discharge TED hose BLE x 6 weeks Weight-Bearing as tolerated to right leg  Small subarachnoid hemorrhage.  Secondary to fall, patient was on Eliquis.  Repeat CT head was without any progression.  Neurosurgery would like to manage conservatively.  No neurologic deficit. Plan: --Hold Eliquis for at least 2 weeks  Paroxysmal atrial fibrillation. --cont Cardizem and Toprol --Hold Eliquis for at least 2 weeks due to subarachnoid hemorrhage  --will need a discussion with her cardiologist regarding continuation of anticoagulation given the high risk of falling  Hypertension.   Blood pressure more elevated --cont home ramipril and Toprol  Thrombocytosis:  This is chronic issue.   --monitor  TIA (transient ischemic attack) and history of stroke --cont lipitor  Leukocytosis no fever.  No source of infection identified.  Likely reactive with some increase postsurgically.  UA with only mild proteinuria. --monitor off of abx  GERD (gastroesophageal reflux disease) --cont Pepcid  Dementia: -Namenda  HLD (hyperlipidemia) -Lipitor   Objective: Vitals:   04/25/21 0512 04/25/21 0817 04/25/21 0912 04/25/21 1602  BP: (!) 150/71 (!) 141/59  (!) 151/67  Pulse: 99 91  89  Resp: 17 16  18   Temp: 98.4 F (36.9 C) 98.9 F (37.2 C)  99.1 F (37.3 C)  TempSrc:  Oral  Oral  SpO2: 92% (!) 88% 95% 97%    Intake/Output Summary (Last 24 hours)  at 04/25/2021 1616 Last data filed at 04/25/2021 0516 Gross per 24 hour  Intake --  Output 275 ml  Net -275 ml   There were no vitals filed for this visit.  Examination:  Constitutional: NAD, sleepy but arousable, confused HEENT: conjunctivae and lids normal, EOMI, mucosa  dry CV: No cyanosis.   RESP: normal respiratory effort, on 2L Extremities: No effusions, edema in BLE SKIN: warm, dry   DVT prophylaxis: SCD/Compression stockings Code Status: DNR  Family Communication: son updated on the phone today Status is: inpatient Dispo:   The patient is from: home Anticipated d/c is to: SNF Anticipated d/c date is: 1-2 days Patient currently is not medically stable to d/c due to: WBC still trending up, needs to see it stabilize. Pt also needs to wake up and eat.   All the records are reviewed and case discussed with Care Management/Social Worker. Management plans discussed with the patient, nursing and they are in agreement.  Consultants:   Orthopedic surgery  Neurosurgery  Cardiology  Procedures:  Antimicrobials:   Data Reviewed: I have personally reviewed following labs and imaging studies  CBC: Recent Labs  Lab 04/21/21 0637 04/22/21 0442 04/23/21 0457 04/24/21 0451 04/25/21 0507  WBC 17.3* 17.8* 19.1* 25.4* 25.5*  NEUTROABS 14.6*  --   --   --   --   HGB 15.2* 12.5 12.6 13.3 11.1*  HCT 46.0 38.3 38.0 40.7 34.4*  MCV 91.1 90.8 93.4 91.9 93.5  PLT 833* 743* 647* 879* 425*   Basic Metabolic Panel: Recent Labs  Lab 04/21/21 0637 04/22/21 0442 04/23/21 0457 04/24/21 0451 04/25/21 0507  NA 134* 136 138 135 139  K 3.6 4.1 4.1 3.8 3.7  CL 96* 100 103 98 103  CO2 28 26 26 26 28   GLUCOSE 138* 115* 142* 138* 122*  BUN 16 24* 23 18 23   CREATININE 0.38* 0.55 0.45 0.39* 0.39*  CALCIUM 8.9 8.6* 8.5* 8.6* 8.4*  MG  --   --   --  1.7 1.8  PHOS  --   --   --  1.9* 1.9*   GFR: CrCl cannot be calculated (Unknown ideal weight.). Liver Function Tests: Recent Labs  Lab 04/24/21 0451  ALBUMIN 3.6   No results for input(s): LIPASE, AMYLASE in the last 168 hours. No results for input(s): AMMONIA in the last 168 hours. Coagulation Profile: Recent Labs  Lab 04/21/21 0637  INR 1.1   Cardiac Enzymes: No results for input(s): CKTOTAL,  CKMB, CKMBINDEX, TROPONINI in the last 168 hours. BNP (last 3 results) No results for input(s): PROBNP in the last 8760 hours. HbA1C: No results for input(s): HGBA1C in the last 72 hours. CBG: No results for input(s): GLUCAP in the last 168 hours. Lipid Profile: No results for input(s): CHOL, HDL, LDLCALC, TRIG, CHOLHDL, LDLDIRECT in the last 72 hours. Thyroid Function Tests: No results for input(s): TSH, T4TOTAL, FREET4, T3FREE, THYROIDAB in the last 72 hours. Anemia Panel: No results for input(s): VITAMINB12, FOLATE, FERRITIN, TIBC, IRON, RETICCTPCT in the last 72 hours. Sepsis Labs: No results for input(s): PROCALCITON, LATICACIDVEN in the last 168 hours.  Recent Results (from the past 240 hour(s))  Resp Panel by RT-PCR (Flu A&B, Covid) Nasopharyngeal Swab     Status: None   Collection Time: 04/21/21  6:37 AM   Specimen: Nasopharyngeal Swab; Nasopharyngeal(NP) swabs in vial transport medium  Result Value Ref Range Status   SARS Coronavirus 2 by RT PCR NEGATIVE NEGATIVE Final    Comment: (NOTE) SARS-CoV-2 target  nucleic acids are NOT DETECTED.  The SARS-CoV-2 RNA is generally detectable in upper respiratory specimens during the acute phase of infection. The lowest concentration of SARS-CoV-2 viral copies this assay can detect is 138 copies/mL. A negative result does not preclude SARS-Cov-2 infection and should not be used as the sole basis for treatment or other patient management decisions. A negative result may occur with  improper specimen collection/handling, submission of specimen other than nasopharyngeal swab, presence of viral mutation(s) within the areas targeted by this assay, and inadequate number of viral copies(<138 copies/mL). A negative result must be combined with clinical observations, patient history, and epidemiological information. The expected result is Negative.  Fact Sheet for Patients:  EntrepreneurPulse.com.au  Fact Sheet for  Healthcare Providers:  IncredibleEmployment.be  This test is no t yet approved or cleared by the [place] FDA and  has been authorized for detection and/or diagnosis of SARS-CoV-2 by FDA under an Emergency Use Authorization (EUA). This EUA will remain  in effect (meaning this test can be used) for the duration of the COVID-19 declaration under Section 564(b)(1) of the Act, 21 U.S.C.section 360bbb-3(b)(1), unless the authorization is terminated  or revoked sooner.       Influenza A by PCR NEGATIVE NEGATIVE Final   Influenza B by PCR NEGATIVE NEGATIVE Final    Comment: (NOTE) The Xpert Xpress SARS-CoV-2/FLU/RSV plus assay is intended as an aid in the diagnosis of influenza from Nasopharyngeal swab specimens and should not be used as a sole basis for treatment. Nasal washings and aspirates are unacceptable for Xpert Xpress SARS-CoV-2/FLU/RSV testing.  Fact Sheet for Patients: EntrepreneurPulse.com.au  Fact Sheet for Healthcare Providers: IncredibleEmployment.be  This test is not yet approved or cleared by the [place] FDA and has been authorized for detection and/or diagnosis of SARS-CoV-2 by FDA under an Emergency Use Authorization (EUA). This EUA will remain in effect (meaning this test can be used) for the duration of the COVID-19 declaration under Section 564(b)(1) of the Act, 21 U.S.C. section 360bbb-3(b)(1), unless the authorization is terminated or revoked.  Performed at [facility], [address], [place], [place] [address]   Surgical PCR screen     Status: None   Collection Time: 04/22/21  1:47 AM   Specimen: Nasal Mucosa; Nasal Swab  Result Value Ref Range Status   MRSA, PCR NEGATIVE NEGATIVE Final   Staphylococcus aureus NEGATIVE NEGATIVE Final    Comment: (NOTE) The Xpert SA Assay (FDA approved for NASAL specimens in patients 24 years of age and older), is one component of a  comprehensive surveillance program. It is not intended to diagnose infection nor to guide or monitor treatment. Performed at [facility], [address], [place], [place] [address]      Radiology Studies: No results found.  Scheduled Meds: . atorvastatin  10 mg Oral Daily  . Chlorhexidine Gluconate Cloth  6 each Topical Daily  . diltiazem  120 mg Oral Daily  . docusate sodium  100 mg Oral BID  . enoxaparin (LOVENOX) injection  40 mg Subcutaneous Q24H  . escitalopram  10 mg Oral Once per day on Sun Tue Thu Sat  . escitalopram  5 mg Oral Once per day on Mon Wed Fri  . famotidine  20 mg Oral QHS  . memantine  5 mg Oral BID  . metoprolol succinate  25 mg Oral Daily  . multivitamin with minerals  1 tablet Oral Daily  . nystatin ointment  1 application Topical BID  . polyethylene  glycol  17 g Oral Daily  . ramipril  10 mg Oral Daily  . timolol  1 drop Both Eyes QPM   Continuous Infusions: . promethazine (PHENERGAN) injection (IM or IVPB) Stopped (04/21/21 2015)     LOS: 4 days   Enzo Bi, MD [facility]  If 7PM-7AM, please contact night-coverage [email or web address]  04/25/2021, 4:16 PM

## 2021-04-25 NOTE — TOC Progression Note (Signed)
Transition of Care [facility]) - Progression Note    Patient Details  Name: Shelly Padilla MRN: [id number] Date of Birth: 12-25-1919  Transition [facility]) CM/SW [place], RN Phone Number: 04/25/2021, 9:21 AM  Clinical Narrative:  TOC in to see patient.  Due to continued medical workup, patient will not be discharged today.  Seth Bake at [facility] and patient's son notified of possible weekend return.  Seth Bake states they prefer Monday return.  If patient has discharge orders this weekend, Please contact Seth Bake at [phone] to evaluate return. TOC contact information given to son, TOC will follow through discharge.    Expected Discharge Plan: [place] Barriers to Discharge: Continued Medical Work up  Expected Discharge Plan and Services Expected Discharge Plan: [place]   Discharge Planning Services: CM Consult Post Acute Care Choice: [place] Living arrangements for the past 2 months: [place]                 DME Arranged:  (SNF resident)         HH Arranged:  (SNF resident)           Social Determinants of Health (SDOH) Interventions    Readmission Risk Interventions No flowsheet data found.

## 2021-04-26 ENCOUNTER — Inpatient Hospital Stay: Payer: Medicare Other

## 2021-04-26 DIAGNOSIS — J9601 Acute respiratory failure with hypoxia: Secondary | ICD-10-CM

## 2021-04-26 LAB — CBC
HCT: 36.9 % (ref 36.0–46.0)
Hemoglobin: 11.7 g/dL — ABNORMAL LOW (ref 12.0–15.0)
MCH: 29.8 pg (ref 26.0–34.0)
MCHC: 31.7 g/dL (ref 30.0–36.0)
MCV: 94.1 fL (ref 80.0–100.0)
Platelets: 792 10*3/uL — ABNORMAL HIGH (ref 150–400)
RBC: 3.92 MIL/uL (ref 3.87–5.11)
RDW: 12 % (ref 11.5–15.5)
WBC: 27 10*3/uL — ABNORMAL HIGH (ref 4.0–10.5)
nRBC: 0 % (ref 0.0–0.2)

## 2021-04-26 LAB — BASIC METABOLIC PANEL
Anion gap: 8 (ref 5–15)
BUN: 27 mg/dL — ABNORMAL HIGH (ref 8–23)
CO2: 28 mmol/L (ref 22–32)
Calcium: 8.5 mg/dL — ABNORMAL LOW (ref 8.9–10.3)
Chloride: 102 mmol/L (ref 98–111)
Creatinine, Ser: 0.37 mg/dL — ABNORMAL LOW (ref 0.44–1.00)
GFR, Estimated: >60 mL/min (ref >60)
Glucose, Bld: 121 mg/dL — ABNORMAL HIGH (ref 70–99)
Potassium: 3.7 mmol/L (ref 3.5–5.1)
Sodium: 138 mmol/L (ref 135–145)

## 2021-04-26 LAB — MAGNESIUM: Magnesium: 1.9 mg/dL (ref 1.7–2.4)

## 2021-04-26 LAB — PROCALCITONIN: Procalcitonin: 0.3 ng/mL

## 2021-04-26 MED ORDER — FUROSEMIDE 10 MG/ML IJ SOLN
40.0000 mg | Freq: Once | INTRAMUSCULAR | Status: AC
  Administered 2021-04-26: 40 mg via INTRAVENOUS
  Filled 2021-04-26: qty 4

## 2021-04-26 MED ORDER — SODIUM PHOSPHATES 45 MMOLE/15ML IV SOLN
20.0000 mmol | Freq: Once | INTRAVENOUS | Status: AC
  Administered 2021-04-26: 20 mmol via INTRAVENOUS
  Filled 2021-04-26: qty 6.67

## 2021-04-26 NOTE — Progress Notes (Signed)
PROGRESS NOTE    Carlie Solorzano  RXV:[id number] DOB: Jun 29, 1920 DOA: 04/21/2021 PCP: Venia Carbon, MD   Brief Narrative: Taken from H&P [place] Adelstein is a 87 y.o. female with medical history significant of HTN, HLD, A fib on Eliquis, TIA, stroke, dementia, thrombocytosis, brain meningioma, depression, GERD, duodenitis, who presents with fall and right hip pain.  Pt has hx of dementia, her mental status was reportedly at baseline, but still cannot provide accurate medical history, therefore, most of the history is obtained by discussing the case with ED physician, per EMS report, and with the nursing staff.  History is limited.  Per report, patient had unwitnessed fall in facility.  She complains of lower back pain and right hip pain.  She was found to have acute, displaced subcapital right femoral neck fracture.  CT head with a small subarachnoid hemorrhage and chronic meningioma with some mass-effect.  Neurosurgery was consulted and they were recommending conservative management, repeat CT head today was without any new changes. Orthopedic was consulted and patient's son decided to proceed with surgery, patient underwent right hemiarthroplasty of hip yesterday.  Tolerated the procedure well.  Subjective: Pt said she was having a terrible day, but couldn't elaborate.  SLP eval, found pt "high risk for aspiration d/t the Cognitive decline/impact on her awareness of swallowing"   Assessment & Plan:   Principal Problem:   Closed right hip fracture ([place]) Active Problems:   HTN (hypertension)   Thrombocytosis   TIA (transient ischemic attack)   Atrial fibrillation, chronic ([place])   Fall   SAH (subarachnoid hemorrhage) (HCC)   Leukocytosis   GERD (gastroesophageal reflux disease)   HLD (hyperlipidemia)   Depression   Paroxysmal A-fib (HCC)   Malnutrition of moderate degree   Status post hip hemiarthroplasty  Closed right hip fracture S/p right hemiarthroplasty on  04/22/21 X-ray shows acute, displaced subcapital right femoral neck fracture. --s/p surgery with Dr. Roland Rack Plan: --d/c to SNF rehab --tramadol and Tylenol PRN for pain, avoid over-sedation --Follow up with [facility] in 2 weeks Lovenox 40 mg SQ daily x 14 days at discharge TED hose BLE x 6 weeks Weight-Bearing as tolerated to right leg  Small subarachnoid hemorrhage.  Secondary to fall, patient was on Eliquis.  Repeat CT head was without any progression.  Neurosurgery would like to manage conservatively.  No neurologic deficit. Plan: --hold Eliquis for at last 2 weeks  Paroxysmal atrial fibrillation. --cont Cardizem and Toprol --hold Eliquis for at last 2 weeks due to subarachnoid hemorrhage  --will need a discussion with her cardiologist regarding continuation of anticoagulation given the high risk of falling  Dysphagia --SLP eval today, found pt at "high risk for aspiration d/t the Cognitive decline/impact on her awareness of swallowing" --dysphagia 1 with nectar thick  Acute hypoxic respiratory failure 2/2 pulm edema and large bilateral pleural effusion 2/2 Fluid overload --noted to desat to 88%, and currently on 2-4L O2 --Likely due to fluid overload from IVF given for reduced oral hydration Plan: --IV lasix 40 mg BID today --Strict I/O --Continue supplemental O2 to keep sats >=92%, wean as tolerated  Hypertension.   Blood pressure more elevated --cont home ramipril and Toprol  Thrombocytosis:  This is chronic issue.   --monitor  TIA (transient ischemic attack) and history of stroke --cont lipitor  Leukocytosis no fever.  No source of infection identified.  Likely reactive with some increase postsurgically.  UA with only mild proteinuria. --monitor off of abx  GERD (gastroesophageal reflux disease) --cont Pepcid  Dementia: -Namenda  HLD (hyperlipidemia) -Lipitor   Objective: Vitals:   04/26/21 0529 04/26/21 0803 04/26/21 0923 04/26/21 1559  BP: (!)  180/87 (!) 180/76 (!) 160/93 (!) 167/117  Pulse: (!) 108 91 89 (!) 105  Resp: 17 (!) 22  20  Temp: 98.1 F (36.7 C) 99.3 F (37.4 C)  98.3 F (36.8 C)  TempSrc: Oral     SpO2: 93% 94% 96% 93%    Intake/Output Summary (Last 24 hours) at 04/26/2021 1617 Last data filed at 04/26/2021 1502 Gross per 24 hour  Intake 430.73 ml  Output 1050 ml  Net -619.27 ml   There were no vitals filed for this visit.  Examination:  Constitutional: NAD, sleepy but arousable, confused HEENT: conjunctivae and lids normal, EOMI, mucosa dry CV: No cyanosis.   RESP: normal respiratory effort, on 2L Extremities: No effusions, edema in BLE SKIN: warm, dry   DVT prophylaxis: SCD/Compression stockings Code Status: DNR  Family Communication:  Status is: inpatient Dispo:   The patient is from: home Anticipated d/c is to: SNF Anticipated d/c date is: 2-3 days Patient currently is not medically stable to d/c due to: WBC still trending up, needs to see it stabilize. IV diuresis for fluid overload.  Dysphagia needs goals of care discussion.   All the records are reviewed and case discussed with Care Management/Social Worker. Management plans discussed with the patient, nursing and they are in agreement.  Consultants:   Orthopedic surgery  Neurosurgery  Cardiology  Procedures:  Antimicrobials:   Data Reviewed: I have personally reviewed following labs and imaging studies  CBC: Recent Labs  Lab 04/21/21 0637 04/22/21 0442 04/23/21 0457 04/24/21 0451 04/25/21 0507 04/26/21 0435  WBC 17.3* 17.8* 19.1* 25.4* 25.5* 27.0*  NEUTROABS 14.6*  --   --   --   --   --   HGB 15.2* 12.5 12.6 13.3 11.1* 11.7*  HCT 46.0 38.3 38.0 40.7 34.4* 36.9  MCV 91.1 90.8 93.4 91.9 93.5 94.1  PLT 833* 743* 647* 879* 719* 062*   Basic Metabolic Panel: Recent Labs  Lab 04/22/21 0442 04/23/21 0457 04/24/21 0451 04/25/21 0507 04/26/21 0435  NA 136 138 135 139 138  K 4.1 4.1 3.8 3.7 3.7  CL 100 103 98 103  102  CO2 26 26 26 28 28   GLUCOSE 115* 142* 138* 122* 121*  BUN 24* 23 18 23  27*  CREATININE 0.55 0.45 0.39* 0.39* 0.37*  CALCIUM 8.6* 8.5* 8.6* 8.4* 8.5*  MG  --   --  1.7 1.8 1.9  PHOS  --   --  1.9* 1.9*  --    GFR: CrCl cannot be calculated (Unknown ideal weight.). Liver Function Tests: Recent Labs  Lab 04/24/21 0451  ALBUMIN 3.6   No results for input(s): LIPASE, AMYLASE in the last 168 hours. No results for input(s): AMMONIA in the last 168 hours. Coagulation Profile: Recent Labs  Lab 04/21/21 0637  INR 1.1   Cardiac Enzymes: No results for input(s): CKTOTAL, CKMB, CKMBINDEX, TROPONINI in the last 168 hours. BNP (last 3 results) No results for input(s): PROBNP in the last 8760 hours. HbA1C: No results for input(s): HGBA1C in the last 72 hours. CBG: No results for input(s): GLUCAP in the last 168 hours. Lipid Profile: No results for input(s): CHOL, HDL, LDLCALC, TRIG, CHOLHDL, LDLDIRECT in the last 72 hours. Thyroid Function Tests: No results for input(s): TSH, T4TOTAL, FREET4, T3FREE, THYROIDAB in the last 72 hours. Anemia Panel: No results for input(s): VITAMINB12, FOLATE, FERRITIN,  TIBC, IRON, RETICCTPCT in the last 72 hours. Sepsis Labs: Recent Labs  Lab 04/26/21 0507  PROCALCITON 0.30    Recent Results (from the past 240 hour(s))  Resp Panel by RT-PCR (Flu A&B, Covid) Nasopharyngeal Swab     Status: None   Collection Time: 04/21/21  6:37 AM   Specimen: Nasopharyngeal Swab; Nasopharyngeal(NP) swabs in vial transport medium  Result Value Ref Range Status   SARS Coronavirus 2 by RT PCR NEGATIVE NEGATIVE Final    Comment: (NOTE) SARS-CoV-2 target nucleic acids are NOT DETECTED.  The SARS-CoV-2 RNA is generally detectable in upper respiratory specimens during the acute phase of infection. The lowest concentration of SARS-CoV-2 viral copies this assay can detect is 138 copies/mL. A negative result does not preclude SARS-Cov-2 infection and should not be  used as the sole basis for treatment or other patient management decisions. A negative result may occur with  improper specimen collection/handling, submission of specimen other than nasopharyngeal swab, presence of viral mutation(s) within the areas targeted by this assay, and inadequate number of viral copies(<138 copies/mL). A negative result must be combined with clinical observations, patient history, and epidemiological information. The expected result is Negative.  Fact Sheet for Patients:  EntrepreneurPulse.com.au  Fact Sheet for Healthcare Providers:  IncredibleEmployment.be  This test is no t yet approved or cleared by the [place] FDA and  has been authorized for detection and/or diagnosis of SARS-CoV-2 by FDA under an Emergency Use Authorization (EUA). This EUA will remain  in effect (meaning this test can be used) for the duration of the COVID-19 declaration under Section 564(b)(1) of the Act, 21 U.S.C.section 360bbb-3(b)(1), unless the authorization is terminated  or revoked sooner.       Influenza A by PCR NEGATIVE NEGATIVE Final   Influenza B by PCR NEGATIVE NEGATIVE Final    Comment: (NOTE) The Xpert Xpress SARS-CoV-2/FLU/RSV plus assay is intended as an aid in the diagnosis of influenza from Nasopharyngeal swab specimens and should not be used as a sole basis for treatment. Nasal washings and aspirates are unacceptable for Xpert Xpress SARS-CoV-2/FLU/RSV testing.  Fact Sheet for Patients: EntrepreneurPulse.com.au  Fact Sheet for Healthcare Providers: IncredibleEmployment.be  This test is not yet approved or cleared by the [place] FDA and has been authorized for detection and/or diagnosis of SARS-CoV-2 by FDA under an Emergency Use Authorization (EUA). This EUA will remain in effect (meaning this test can be used) for the duration of the COVID-19 declaration under Section  564(b)(1) of the Act, 21 U.S.C. section 360bbb-3(b)(1), unless the authorization is terminated or revoked.  Performed at [facility], [address], [place], [place] [address]   Surgical PCR screen     Status: None   Collection Time: 04/22/21  1:47 AM   Specimen: Nasal Mucosa; Nasal Swab  Result Value Ref Range Status   MRSA, PCR NEGATIVE NEGATIVE Final   Staphylococcus aureus NEGATIVE NEGATIVE Final    Comment: (NOTE) The Xpert SA Assay (FDA approved for NASAL specimens in patients 55 years of age and older), is one component of a comprehensive surveillance program. It is not intended to diagnose infection nor to guide or monitor treatment. Performed at [facility], [address] [place], [place] [address]      Radiology Studies: DG Chest [place] 1 View  Result Date: 04/26/2021 CLINICAL DATA:  87 year old postop day 4 RIGHT hip unipolar arthroplasty, presenting with acute hypoxia. EXAM: PORTABLE CHEST 1 VIEW COMPARISON:  04/21/2021 and earlier. FINDINGS: Cardiac silhouette mildly  to moderately enlarged for AP portable technique, unchanged. Interval development of moderately large BILATERAL pleural effusions and mild diffuse interstitial pulmonary edema since the examination 5 days ago. Associated consolidation in the lower lobes. IMPRESSION: 1. Acute CHF and/or fluid overload, with mild diffuse interstitial pulmonary edema and moderately large BILATERAL pleural effusions which are new since the examination 5 days ago. 2. Associated passive atelectasis (favored over pneumonia) in the lower lobes. Electronically Signed   By: Evangeline Dakin M.D.   On: 04/26/2021 11:50    Scheduled Meds: . atorvastatin  10 mg Oral Daily  . Chlorhexidine Gluconate Cloth  6 each Topical Daily  . diltiazem  120 mg Oral Daily  . docusate sodium  100 mg Oral BID  . enoxaparin (LOVENOX) injection  40 mg Subcutaneous Q24H  . escitalopram  10 mg Oral Once per day on Sun Tue Thu Sat  .  escitalopram  5 mg Oral Once per day on Mon Wed Fri  . famotidine  20 mg Oral QHS  . memantine  5 mg Oral BID  . metoprolol succinate  25 mg Oral Daily  . multivitamin with minerals  1 tablet Oral Daily  . nystatin ointment  1 application Topical BID  . polyethylene glycol  17 g Oral Daily  . ramipril  10 mg Oral Daily  . timolol  1 drop Both Eyes QPM   Continuous Infusions: . promethazine (PHENERGAN) injection (IM or IVPB) Stopped (04/21/21 2015)  . sodium phosphate  Dextrose 5% IVPB 20 mmol (04/26/21 1054)     LOS: 5 days   Enzo Bi, MD [facility]  If 7PM-7AM, please contact night-coverage [email or web address]  04/26/2021, 4:17 PM

## 2021-04-26 NOTE — Progress Notes (Signed)
Physical Therapy Treatment Patient Details Name: Shelly Padilla MRN: [id number] DOB: 06-01-1920 Today's Date: 04/26/2021    History of Present Illness Pt is a 87 y.o. female with medical history significant of HTN, HLD, A fib on Eliquis, TIA, stroke, dementia, thrombocytosis, brain meningioma, depression, GERD, duodenitis, who presents with fall and right hip pain.  Pt diagnosed with R femoral neck fracture and is s/p hemiarthroplasty.  MD assessment also includes:  small subarachnoid hemorrhage secondary to the fall and leukocytosis.    PT Comments    Pt was asleep in long sitting upon arriving. She awakes to gentle touch and is cooperative throughout. Disoriented but extremely pleasant. Required total assist to progress to EOB however once seated EOB was more alert and conversational. Pt is severely limited by pain in RLE. Was able to tolerate very minimal gentle ROM and strengthening exercises. Will require extensive PT going forward to return to PLOF. Highly recommend DC to SNF to address deficits while maximizing independence.   Follow Up Recommendations  SNF     Equipment Recommendations  Other (comment) (Defer to next level of care)       Precautions / Restrictions Precautions Precautions: Posterior Hip Precaution Booklet Issued: Yes (comment) Restrictions Weight Bearing Restrictions: Yes RLE Weight Bearing: Weight bearing as tolerated    Mobility  Bed Mobility Overal bed mobility: Needs Assistance Bed Mobility: Supine to Sit;Sit to Supine     Supine to sit: Total assist;HOB elevated Sit to supine: HOB elevated;Total assist   General bed mobility comments: Pt was able to tolerate sitting EOB after requiring total assist to achieve short sit. Pt very sweet but severely limited by pain and cognition.    Transfers      General transfer comment: unsafe at this time      Balance Overall balance assessment: Needs assistance Sitting-balance support: Bilateral upper  extremity supported;Feet supported Sitting balance-Leahy Scale: Fair Sitting balance - Comments: was able to sit EOB x 8 minutes while conversating about "good ole days."       Cognition Arousal/Alertness: Awake/alert Behavior During Therapy: Flat affect Overall Cognitive Status: Impaired/Different from baseline Area of Impairment: Orientation;Attention;Awareness      Orientation Level: Disoriented to;Place;Time;Situation Current Attention Level: Alternating       Awareness: Intellectual   General Comments: Pt is lethargic but does awake with gentle touch and vcing. Pt is extremely pleasant but severely limited by pain.      Exercises Total Joint Exercises Ankle Circles/Pumps: AROM;10 reps Hip ABduction/ADduction: PROM;AAROM;Both;5 reps Straight Leg Raises: PROM;AAROM;Both;5 reps        Pertinent Vitals/Pain Pain Assessment: 0-10 Pain Score: 0-No pain Pain Location: R hip Pain Descriptors / Indicators: Grimacing;Moaning Pain Intervention(s): Limited activity within patient's tolerance;Monitored during session;Premedicated before session;Repositioned           PT Goals (current goals can now be found in the care plan section) Acute Rehab PT Goals Patient Stated Goal: get better Progress towards PT goals: Not progressing toward goals - comment (limited by pain/cognition/medical complications)    Frequency    7X/week      PT Plan Current plan remains appropriate       AM-PAC PT "6 Clicks" Mobility   Outcome Measure  Help needed turning from your back to your side while in a flat bed without using bedrails?: Total Help needed moving from lying on your back to sitting on the side of a flat bed without using bedrails?: Total Help needed moving to and from a bed to a  chair (including a wheelchair)?: Total Help needed standing up from a chair using your arms (e.g., wheelchair or bedside chair)?: Total Help needed to walk in hospital room?: Total Help needed  climbing 3-5 steps with a railing? : Total 6 Click Score: 6    End of Session Equipment Utilized During Treatment: Oxygen (4 L) Activity Tolerance: Patient limited by pain Patient left: in bed;with call bell/phone within reach;with bed alarm set;with SCD's reapplied;Other (comment) Nurse Communication: Mobility status PT Visit Diagnosis: History of falling (Z91.81);Unsteadiness on feet (R26.81);Other abnormalities of gait and mobility (R26.89);Muscle weakness (generalized) (M62.81);Pain Pain - Right/Left: Right Pain - part of body: Hip     Time: 8592-9244 PT Time Calculation (min) (ACUTE ONLY): 16 min  Charges:  $Therapeutic Activity: 8-22 mins                     Julaine Fusi PTA 04/26/21, 2:52 PM

## 2021-04-26 NOTE — Progress Notes (Signed)
Pt chokes and coughs up large amt of sputum with small sips of water.  LS course.  Unable to give am meds due to concern for aspiration.  MD notified.

## 2021-04-26 NOTE — Plan of Care (Signed)
  Problem: Activity: Goal: Risk for activity intolerance will decrease Outcome: Progressing   Problem: Nutrition: Goal: Adequate nutrition will be maintained Outcome: Progressing   Problem: Coping: Goal: Level of anxiety will decrease Outcome: Progressing   Problem: Pain Managment: Goal: General experience of comfort will improve Outcome: Progressing   Problem: Safety: Goal: Ability to remain free from injury will improve Outcome: Progressing   

## 2021-04-26 NOTE — Progress Notes (Signed)
   Subjective: 4 Days Post-Op Procedure(s) (LRB): ARTHROPLASTY BIPOLAR HIP (HEMIARTHROPLASTY) (Right) Patient able to awaken.  Comfortable. We will continue therapy today.    Objective: Vital signs in last 24 hours: Temp:  [97.8 F (36.6 C)-99.1 F (37.3 C)] 98.1 F (36.7 C) (05/14 0529) Pulse Rate:  [88-108] 108 (05/14 0529) Resp:  [16-18] 17 (05/14 0529) BP: (141-180)/(59-87) 180/87 (05/14 0529) SpO2:  [88 %-97 %] 93 % (05/14 0529)  Intake/Output from previous day: 05/13 0701 - 05/14 0700 In: 0.7 [I.V.:0.7] Out: 350 [Urine:350] Intake/Output this shift: Total I/O In: 0.7 [I.V.:0.7] Out: 350 [Urine:350]  Recent Labs    04/24/21 0451 04/25/21 0507 04/26/21 0435  HGB 13.3 11.1* 11.7*   Recent Labs    04/25/21 0507 04/26/21 0435  WBC 25.5* 27.0*  RBC 3.68* 3.92  HCT 34.4* 36.9  PLT 719* 792*   Recent Labs    04/25/21 0507 04/26/21 0435  NA 139 138  K 3.7 3.7  CL 103 102  CO2 28 28  BUN 23 27*  CREATININE 0.39* 0.37*  GLUCOSE 122* 121*  CALCIUM 8.4* 8.5*   No results for input(s): LABPT, INR in the last 72 hours.  EXAM General - Patient is Alert and Confused Extremity - Neurovascular intact Sensation intact distally Intact pulses distally Dorsiflexion/Plantar flexion intact No cellulitis present Compartment soft Dressing - dressing C/D/I and no drainage Motor Function - intact, moving foot and toes well on exam.   Past Medical History:  Diagnosis Date  . Brain tumor ([place])    meningioma  . Glaucoma   . H/O paroxysmal supraventricular tachycardia    documented by Holter Monitor  . Hemorrhoids   . Hiatal hernia   . History of echocardiogram    a. 11/2017 Echo: EF 60-65%, no rwma, triv AI.  Marland Kitchen History of ovarian cyst   . HTN (hypertension)   . Insomnia   . PAF (paroxysmal atrial fibrillation) (HCC)    a. CHA2DS2VASc = 4-->Eliquis.  . Palpitations   . Thrombocytosis     Assessment/Plan:   4 Days Post-Op Procedure(s)  (LRB): ARTHROPLASTY BIPOLAR HIP (HEMIARTHROPLASTY) (Right) Principal Problem:   Closed right hip fracture (HCC) Active Problems:   HTN (hypertension)   Thrombocytosis   TIA (transient ischemic attack)   Atrial fibrillation, chronic ([place])   Fall   SAH (subarachnoid hemorrhage) (HCC)   Leukocytosis   GERD (gastroesophageal reflux disease)   HLD (hyperlipidemia)   Depression   Paroxysmal A-fib (HCC)   Malnutrition of moderate degree   Status post hip hemiarthroplasty  Estimated body mass index is 22.44 kg/m as calculated from the following:   Height as of 09/02/19: 5\' 7"  (1.702 m).   Weight as of 09/02/19: 65 kg. Advance diet Up with therapy  Work on BM VSS WBC trending up. No signs of infection along surgery site. Continue to monitor Pain controlled CM to assist with discharge to SNF  Follow up with [facility] in 2 weeks Lovenox 40 mg SQ daily x 14 days at discharge TED hose BLE x 6 weeks  DVT Prophylaxis - Lovenox, Foot Pumps and TED hose Weight-Bearing as tolerated to right leg   Reche Dixon PA-C [place] 04/26/2021, 6:56 AM

## 2021-04-26 NOTE — Evaluation (Addendum)
Clinical/Bedside Swallow Evaluation Patient Details  Name: Shelly Padilla MRN: [id number] Date of Birth: 04-07-1920  Today's Date: 04/26/2021 Time: SLP Start Time (ACUTE ONLY): 54 SLP Stop Time (ACUTE ONLY): 1230 SLP Time Calculation (min) (ACUTE ONLY): 60 min  Past Medical History:  Past Medical History:  Diagnosis Date  . Brain tumor ([place])    meningioma  . Glaucoma   . H/O paroxysmal supraventricular tachycardia    documented by Holter Monitor  . Hemorrhoids   . Hiatal hernia   . History of echocardiogram    a. 11/2017 Echo: EF 60-65%, no rwma, triv AI.  Marland Kitchen History of ovarian cyst   . HTN (hypertension)   . Insomnia   . PAF (paroxysmal atrial fibrillation) (HCC)    a. CHA2DS2VASc = 4-->Eliquis.  . Palpitations   . Thrombocytosis    Past Surgical History:  Past Surgical History:  Procedure Laterality Date  . CATARACT EXTRACTION     left eye  . HIP ARTHROPLASTY Right 04/22/2021   Procedure: ARTHROPLASTY BIPOLAR HIP (HEMIARTHROPLASTY);  Surgeon: Corky Mull, MD;  Location: [facility] ORS;  Service: Orthopedics;  Laterality: Right;  . Leg vein stripping  1970  . TONSILLECTOMY  1929   HPI:  Pt  is a 87 y.o. female with medical history significant of advanced Dementia, malnutrition, HTN, HLD, A fib on Eliquis, TIA, stroke, thrombocytosis, brain meningioma, depression, GERD and Hiatal Hernia, duodenitis, who presents with fall and right hip pain at her Facility.  Head CT on 5/10: "Small amount of hemorrhage in the posterior right frontal lobe  persists. No new foci of hemorrhage. Persistent right occipital  region meningioma with localized mass effect, similar to 1 day  prior.   Atrophy with periventricular small vessel disease. Prior infarcts in  the lateral dentate nucleus of the cerebellum on the left and in the  region of the genu of the right internal capsule.".  CXR: "Acute CHF and/or fluid overload, with mild diffuse interstitial  pulmonary edema and moderately large BILATERAL  pleural effusions  which are new since the examination 5 days ago.  2. Associated passive atelectasis (favored over pneumonia) in the  lower lobes.".  Orthopedics following: pt is postop day 4 (at this eval) for Right ARTHROPLASTY BIPOLAR HIP (HEMIARTHROPLASTY).   Assessment / Plan / Recommendation Clinical Impression  Pt appears to present w/ Mod+ oropharyngeal phase dysphagia suspect impacted declined Cognitive status, baseline Dementia, which appeared to impact her overall awareness/engagement w/ po tasks during oropharyngeal swallowing. This increases risk for aspiration thus Pulmonary decline. Pt's risk for aspiration is present but can be reduced when following general aspiration precautions, given feeding assistance, and using a modified diet consistency.. She requires mod visual/tactile/verbal cues for orientation to bolus presentation; and feeding support w/ all trials given. Pt consumed trials of Nectar liquids via TSP/Cup then purees w/ no immediate, overt clinical s/s of aspiration noted. However, pt exhibited a mildly wet/phlegmy vocal quality b/t trials intermittently as noted at Baseline. Unsure of pt's own awareness of pharyngolaryngeal secretions. She was Cued to use a cough/throat clear to clear. No decline in respiratory status noted during/post trials. Oral phase was impacted by pt's overall declined awareness during po tasks resulting in slower attention to, and lingual manipulatino of, po trials. Decreased labial closure and lingual sweeping for full clearing noted. Given verbal cues and Time, and alternating foods/liquids, pt was able to establish adequate bolus management and oral clearing of the boluses given, Slower bolus manipulation and swallowing to clear boluses fully occurred  more often towards end of trials. She appeared to gum the boluses a little longer which delayed A-P transit times. Fatigue factor vs Fullness factor, or both suspected. Thin liquids were not assessed d/t pt's  declined attention to task, baseline presentation of swallowing, and Cognitive decline from Dementia, and her risk for aspiration. Discussed diet consistency of foods/thickened liquids w/ MD/NSG; aspiration precautions; pills Crushed in puree; feeding support and supervision.  Recommend a Dysphagia level 1(puree) w/ Nectar liquids via TSP at this time; aspiration precautions; reduce Distractions during meals. Alterrnating foods/liquids. Pills Crushed in Puree for safer swallowing. Support w/ feeding at meals -- check for oral clearing during/post intake. REFLUX precautions. NSG/MD updated. Precautions posted in room. SLP Visit Diagnosis: Dysphagia, oropharyngeal phase (R13.12) (baseline Dementia)    Aspiration Risk  Moderate aspiration risk;Risk for inadequate nutrition/hydration    Diet Recommendation  Dysphagia level 1 (puree) w/ gravies to moisten; NECTAR consistency liquids VIA TSP currently. Aspiration precautions. Reflux precautions d/t hiatal hernia. Feeding Support -- alternating foods/liquids and giving Time to clear orally (check for oral clearing).  Monitor fullness feelings.   Medication Administration: Crushed with puree (for safer swallowing)    Other  Recommendations Recommended Consults:  (Palliative Care consult for [place]; Dietician f/u) Oral Care Recommendations: Oral care BID;Oral care before and after PO;Staff/trained caregiver to provide oral care Other Recommendations: Order thickener from pharmacy;Remove water pitcher;Prohibited food (jello, ice cream, thin soups);Have oral suction available   Follow up Recommendations  (TBD)      Frequency and Duration min 2x/week  1 week       Prognosis Prognosis for Safe Diet Advancement: Guarded Barriers to Reach Goals: Cognitive deficits;Time post onset;Severity of deficits;Behavior Barriers/Prognosis Comment: hiatal hernia; Dementia      Swallow Study   General Date of Onset: 04/21/21 HPI: Pt  is a 87 y.o. female with  medical history significant of advanced Dementia, malnutrition, HTN, HLD, A fib on Eliquis, TIA, stroke, thrombocytosis, brain meningioma, depression, GERD and Hiatal Hernia, duodenitis, who presents with fall and right hip pain at her Facility.  Head CT on 5/10: "Small amount of hemorrhage in the posterior right frontal lobe  persists. No new foci of hemorrhage. Persistent right occipital  region meningioma with localized mass effect, similar to 1 day  prior.   Atrophy with periventricular small vessel disease. Prior infarcts in  the lateral dentate nucleus of the cerebellum on the left and in the  region of the genu of the right internal capsule.".  CXR: "Acute CHF and/or fluid overload, with mild diffuse interstitial  pulmonary edema and moderately large BILATERAL pleural effusions  which are new since the examination 5 days ago.  2. Associated passive atelectasis (favored over pneumonia) in the  lower lobes.".  Orthopedics following: pt is postop day 4 (at this eval) for Right ARTHROPLASTY BIPOLAR HIP (HEMIARTHROPLASTY). Type of Study: Bedside Swallow Evaluation Previous Swallow Assessment: none reported Diet Prior to this Study: Dysphagia 1 (puree);Thin liquids Temperature Spikes Noted: No (wbc 27.0) Respiratory Status: Nasal cannula (4L) History of Recent Intubation: No Behavior/Cognition: Alert;Cooperative;Pleasant mood;Confused;Distractible;Requires cueing (Dementia) Oral Cavity Assessment: Dry (min debris) Oral Care Completed by SLP: Yes Oral Cavity - Dentition: Adequate natural dentition Vision:  (n/a - pt did not attempt) Self-Feeding Abilities: Total assist Patient Positioning: Upright in bed (needed positioning Upright) Baseline Vocal Quality: Wet (min phlegmy, wet) Volitional Cough:  (Fair; throat clearing) Volitional Swallow: Able to elicit (cued)    Oral/Motor/Sensory Function Overall Oral Motor/Sensory Function: Within functional limits (grossly)  Ice Chips Ice chips: Not tested    Thin Liquid Thin Liquid: Not tested    Nectar Thick Nectar Thick Liquid: Impaired Presentation: Cup;Spoon (3 trials via Cup; ~2 ozs via Spoon - fed) Oral Phase Impairments: Poor awareness of bolus;Reduced lingual movement/coordination;Reduced labial seal (cued) Oral phase functional implications: Prolonged oral transit (anterior leakage at times) Pharyngeal Phase Impairments: Throat Clearing - Delayed;Wet Vocal Quality (w/ Cup trials; wet vocal quality x2-3 i between)   Honey Thick Honey Thick Liquid: Not tested   Puree Puree: Impaired Presentation: Spoon (fed; 10 trials) Oral Phase Impairments: Reduced labial seal;Poor awareness of bolus;Reduced lingual movement/coordination (cued) Oral Phase Functional Implications: Prolonged oral transit Pharyngeal Phase Impairments: Wet Vocal Quality (x1-2 in between)   Solid     Solid: Not tested       Orinda Kenner, MS, CCC-SLP Speech Language Pathologist Rehab Services [phone] [facility] 04/26/2021,2:29 PM

## 2021-04-26 NOTE — Anesthesia Postprocedure Evaluation (Signed)
Anesthesia Post Note  Patient: Shelly Padilla  Procedure(s) Performed: ARTHROPLASTY BIPOLAR HIP (HEMIARTHROPLASTY) (Right Hip)  Patient location during evaluation: PACU Anesthesia Type: General Level of consciousness: awake and alert Pain management: pain level controlled Vital Signs Assessment: post-procedure vital signs reviewed and stable Respiratory status: spontaneous breathing, nonlabored ventilation, respiratory function stable and patient connected to nasal cannula oxygen Cardiovascular status: blood pressure returned to baseline and stable Postop Assessment: no apparent nausea or vomiting Anesthetic complications: no   No complications documented.   Last Vitals:  Vitals:   04/26/21 0529 04/26/21 0803  BP: (!) 180/87 (!) 180/76  Pulse: (!) 108 91  Resp: 17 (!) 22  Temp: 36.7 C 37.4 C  SpO2: 93% 94%    Last Pain:  Vitals:   04/26/21 0529  TempSrc: Oral  PainSc:                  Molli Barrows

## 2021-04-27 DIAGNOSIS — R1310 Dysphagia, unspecified: Secondary | ICD-10-CM

## 2021-04-27 LAB — PHOSPHORUS: Phosphorus: 2.1 mg/dL — ABNORMAL LOW (ref 2.5–4.6)

## 2021-04-27 LAB — CBC WITH DIFFERENTIAL/PLATELET
Abs Immature Granulocytes: 0.71 10*3/uL — ABNORMAL HIGH (ref 0.00–0.07)
Basophils Absolute: 0.1 10*3/uL (ref 0.0–0.1)
Basophils Relative: 0 %
Eosinophils Absolute: 0.1 10*3/uL (ref 0.0–0.5)
Eosinophils Relative: 0 %
HCT: 40.3 % (ref 36.0–46.0)
Hemoglobin: 13.1 g/dL (ref 12.0–15.0)
Immature Granulocytes: 2 %
Lymphocytes Relative: 2 %
Lymphs Abs: 0.5 10*3/uL — ABNORMAL LOW (ref 0.7–4.0)
MCH: 30 pg (ref 26.0–34.0)
MCHC: 32.5 g/dL (ref 30.0–36.0)
MCV: 92.2 fL (ref 80.0–100.0)
Monocytes Absolute: 3.3 10*3/uL — ABNORMAL HIGH (ref 0.1–1.0)
Monocytes Relative: 10 %
Neutro Abs: 29.4 10*3/uL — ABNORMAL HIGH (ref 1.7–7.7)
Neutrophils Relative %: 86 %
Platelets: 925 10*3/uL (ref 150–400)
RBC: 4.37 MIL/uL (ref 3.87–5.11)
RDW: 11.9 % (ref 11.5–15.5)
Smear Review: NORMAL
WBC: 34.1 10*3/uL — ABNORMAL HIGH (ref 4.0–10.5)
nRBC: 0 % (ref 0.0–0.2)

## 2021-04-27 LAB — BASIC METABOLIC PANEL
Anion gap: 16 — ABNORMAL HIGH (ref 5–15)
BUN: 23 mg/dL (ref 8–23)
CO2: 31 mmol/L (ref 22–32)
Calcium: 8.6 mg/dL — ABNORMAL LOW (ref 8.9–10.3)
Chloride: 93 mmol/L — ABNORMAL LOW (ref 98–111)
Creatinine, Ser: 0.55 mg/dL (ref 0.44–1.00)
GFR, Estimated: >60 mL/min (ref >60)
Glucose, Bld: 147 mg/dL — ABNORMAL HIGH (ref 70–99)
Potassium: 2.6 mmol/L — CL (ref 3.5–5.1)
Sodium: 140 mmol/L (ref 135–145)

## 2021-04-27 LAB — CBC
HCT: 40 % (ref 36.0–46.0)
Hemoglobin: 13.3 g/dL (ref 12.0–15.0)
MCH: 30.3 pg (ref 26.0–34.0)
MCHC: 33.3 g/dL (ref 30.0–36.0)
MCV: 91.1 fL (ref 80.0–100.0)
Platelets: 893 10*3/uL — ABNORMAL HIGH (ref 150–400)
RBC: 4.39 MIL/uL (ref 3.87–5.11)
RDW: 11.9 % (ref 11.5–15.5)
WBC: 33.1 10*3/uL — ABNORMAL HIGH (ref 4.0–10.5)
nRBC: 0 % (ref 0.0–0.2)

## 2021-04-27 LAB — MAGNESIUM: Magnesium: 1.6 mg/dL — ABNORMAL LOW (ref 1.7–2.4)

## 2021-04-27 LAB — PROCALCITONIN: Procalcitonin: 0.66 ng/mL

## 2021-04-27 MED ORDER — POTASSIUM CHLORIDE 10 MEQ/100ML IV SOLN
10.0000 meq | INTRAVENOUS | Status: AC
  Administered 2021-04-27 (×2): 10 meq via INTRAVENOUS
  Filled 2021-04-27 (×2): qty 100

## 2021-04-27 MED ORDER — MAGNESIUM SULFATE 2 GM/50ML IV SOLN
2.0000 g | Freq: Once | INTRAVENOUS | Status: AC
  Administered 2021-04-27: 2 g via INTRAVENOUS
  Filled 2021-04-27: qty 50

## 2021-04-27 MED ORDER — POTASSIUM CHLORIDE 20 MEQ PO PACK
40.0000 meq | PACK | ORAL | Status: AC
  Administered 2021-04-27 (×2): 40 meq via ORAL
  Filled 2021-04-27 (×2): qty 2

## 2021-04-27 MED ORDER — POTASSIUM CHLORIDE CRYS ER 20 MEQ PO TBCR
40.0000 meq | EXTENDED_RELEASE_TABLET | Freq: Once | ORAL | Status: DC
  Filled 2021-04-27: qty 2

## 2021-04-27 NOTE — Progress Notes (Signed)
   Subjective: 5 Days Post-Op Procedure(s) (LRB): ARTHROPLASTY BIPOLAR HIP (HEMIARTHROPLASTY) (Right) Patient resting comfortably.  No signs of distress. We will continue therapy today.    Objective: Vital signs in last 24 hours: Temp:  [98 F (36.7 C)-99.3 F (37.4 C)] 98 F (36.7 C) (05/15 0450) Pulse Rate:  [76-109] 101 (05/15 0450) Resp:  [18-22] 19 (05/15 0450) BP: (158-180)/(76-117) 169/90 (05/15 0450) SpO2:  [92 %-96 %] 94 % (05/15 0450)  Intake/Output from previous day: 05/14 0701 - 05/15 0700 In: 430 [P.O.:180; I.V.:250] Out: 2200 [Urine:2200] Intake/Output this shift: Total I/O In: -  Out: 900 [Urine:900]  Recent Labs    04/25/21 0507 04/26/21 0435  HGB 11.1* 11.7*   Recent Labs    04/25/21 0507 04/26/21 0435  WBC 25.5* 27.0*  RBC 3.68* 3.92  HCT 34.4* 36.9  PLT 719* 792*   Recent Labs    04/25/21 0507 04/26/21 0435  NA 139 138  K 3.7 3.7  CL 103 102  CO2 28 28  BUN 23 27*  CREATININE 0.39* 0.37*  GLUCOSE 122* 121*  CALCIUM 8.4* 8.5*   No results for input(s): LABPT, INR in the last 72 hours.  EXAM General - Patient is Alert and Confused Extremity - Neurovascular intact Sensation intact distally Intact pulses distally Dorsiflexion/Plantar flexion intact No cellulitis present Compartment soft Dressing - dressing C/D/I and no drainage Motor Function - intact, moving foot and toes well on exam.  Minimal ambulation yesterday.  Past Medical History:  Diagnosis Date  . Brain tumor ([place])    meningioma  . Glaucoma   . H/O paroxysmal supraventricular tachycardia    documented by Holter Monitor  . Hemorrhoids   . Hiatal hernia   . History of echocardiogram    a. 11/2017 Echo: EF 60-65%, no rwma, triv AI.  Marland Kitchen History of ovarian cyst   . HTN (hypertension)   . Insomnia   . PAF (paroxysmal atrial fibrillation) (HCC)    a. CHA2DS2VASc = 4-->Eliquis.  . Palpitations   . Thrombocytosis     Assessment/Plan:   5 Days Post-Op  Procedure(s) (LRB): ARTHROPLASTY BIPOLAR HIP (HEMIARTHROPLASTY) (Right) Principal Problem:   Closed right hip fracture (HCC) Active Problems:   HTN (hypertension)   Thrombocytosis   TIA (transient ischemic attack)   Atrial fibrillation, chronic ([place])   Fall   SAH (subarachnoid hemorrhage) (HCC)   Leukocytosis   GERD (gastroesophageal reflux disease)   HLD (hyperlipidemia)   Depression   Paroxysmal A-fib (HCC)   Malnutrition of moderate degree   Status post hip hemiarthroplasty  Estimated body mass index is 22.44 kg/m as calculated from the following:   Height as of 09/02/19: 5\' 7"  (1.702 m).   Weight as of 09/02/19: 65 kg. Advance diet Up with therapy  Work on BM VSS WBC trending up. No signs of infection along surgery site. Continue to monitor Pain controlled CM to assist with discharge to SNF  Follow up with [place] [facility] ortho in 2 weeks Lovenox 40 mg SQ daily x 14 days at discharge TED hose BLE x 6 weeks  DVT Prophylaxis - Lovenox, Foot Pumps and TED hose Weight-Bearing as tolerated to right leg   Reche Dixon PA-C [place] 04/27/2021, 5:31 AM

## 2021-04-27 NOTE — Plan of Care (Signed)

## 2021-04-27 NOTE — Progress Notes (Signed)
PROGRESS NOTE    Shelly Padilla  GQQ:[id number] DOB: 01/25/1920 DOA: 04/21/2021 PCP: Venia Carbon, MD   Brief Narrative: Taken from H&P Shelly Padilla is a 87 y.o. female with medical history significant of HTN, HLD, A fib on Eliquis, TIA, stroke, dementia, thrombocytosis, brain meningioma, depression, GERD, duodenitis, who presents with fall and right hip pain.  Pt has hx of dementia, her mental status was reportedly at baseline, but still cannot provide accurate medical history, therefore, most of the history is obtained by discussing the case with ED physician, per EMS report, and with the nursing staff.  History is limited.  Per report, patient had unwitnessed fall in facility.  She complains of lower back pain and right hip pain.  She was found to have acute, displaced subcapital right femoral neck fracture.  CT head with a small subarachnoid hemorrhage and chronic meningioma with some mass-effect.  Neurosurgery was consulted and they were recommending conservative management, repeat CT head today was without any new changes. Orthopedic was consulted and patient's son decided to proceed with surgery, patient underwent right hemiarthroplasty of hip yesterday.  Tolerated the procedure well.  Subjective: Pt was more awake today with her neighbor and son visiting and talking with her.  Found to be confused and hallucinating.   Assessment & Plan:   Principal Problem:   Closed right hip fracture ([place]) Active Problems:   HTN (hypertension)   Thrombocytosis   TIA (transient ischemic attack)   Atrial fibrillation, chronic (HCC)   Fall   SAH (subarachnoid hemorrhage) (HCC)   Leukocytosis   GERD (gastroesophageal reflux disease)   HLD (hyperlipidemia)   Depression   Paroxysmal A-fib (HCC)   Malnutrition of moderate degree   Status post hip hemiarthroplasty  Closed right hip fracture S/p right hemiarthroplasty on 04/22/21 X-ray shows acute, displaced subcapital right femoral  neck fracture. --s/p surgery with Dr. Roland Rack Plan: --d/c to SNF rehab --tramadol and Tylenol PRN for pain, avoid over-sedation --Follow up with [facility] ortho in 2 weeks Lovenox 40 mg SQ daily x 14 days at discharge TED hose BLE x 6 weeks Weight-Bearing as tolerated to right leg  Small subarachnoid hemorrhage.  Secondary to fall, patient was on Eliquis.  Repeat CT head was without any progression.  Neurosurgery would like to manage conservatively.  No neurologic deficit. Plan: --Hold eliquis for at least 2 weeks  Paroxysmal atrial fibrillation. --cont Cardizem and Toprol --hold Eliquis for at last 2 weeks due to subarachnoid hemorrhage  --will need a discussion with her cardiologist regarding continuation of anticoagulation given the high risk of falling  Dysphagia --SLP eval, found pt at "high risk for aspiration d/t the Cognitive decline/impact on her awareness of swallowing" --dysphagia 1 with nectar thick  Acute hypoxic respiratory failure 2/2 pulm edema and large bilateral pleural effusion 2/2 Fluid overload --noted to desat to 88%, and currently on 2-4L O2 --Likely due to fluid overload from IVF given for reduced oral hydration --s/p IV lasix 40 mg BID on 5/14 Plan: --Continue supplemental O2 to keep sats >=92%, wean as tolerated  Hypertension.   Blood pressure more elevated --cont home cardizem, Toprol and ramipril --IV hydralazine PRN  Acute on chronic Thrombocytosis:  This is chronic issue, but worsening, likely due to stress response --monitor for now  TIA (transient ischemic attack) and history of stroke --cont lipitor  Leukocytosis, worsening no fever.  No source of infection identified.  Likely reactive with some increase postsurgically.  UA on presentation with only mild proteinuria. --trend procal --  obtain blood cx --repeat UA  GERD (gastroesophageal reflux disease) --cont Pepcid  [facility] delirium -cont home Namenda --orientation and keep  day/night cycles  HLD (hyperlipidemia) -Lipitor   Objective: Vitals:   04/26/21 2057 04/27/21 0450 04/27/21 0828 04/27/21 1249  BP:  (!) 169/90 (!) 183/91 (!) 154/87  Pulse: 76 (!) 101 96 88  Resp:  19 (!) 22 18  Temp:  98 F (36.7 C) 98.7 F (37.1 C) 97.9 F (36.6 C)  TempSrc:      SpO2: 92% 94% 91% 91%    Intake/Output Summary (Last 24 hours) at 04/27/2021 1445 Last data filed at 04/27/2021 1300 Gross per 24 hour  Intake 40 ml  Output 2200 ml  Net -2160 ml   There were no vitals filed for this visit.  Examination:  Constitutional: NAD, alert, confused HEENT: conjunctivae and lids normal, EOMI, mucosa dry CV: No cyanosis.   RESP: gurgling sounds, on 2L Extremities: No effusions, edema in BLE SKIN: warm, dry Neuro: II - XII grossly intact.     DVT prophylaxis: SCD/Compression stockings Code Status: DNR  Obtain palliative consult Family Communication: son updated at bedside today Status is: inpatient Dispo:   The patient is from: home Anticipated d/c is to: SNF Anticipated d/c date is: 2-3 days Patient currently is not medically stable to d/c due to: WBC still trending up, needs to see it stabilize. IV diuresis for fluid overload.  Dysphagia needs goals of care discussion.   All the records are reviewed and case discussed with Care Management/Social Worker. Management plans discussed with the patient, nursing and they are in agreement.  Consultants:   Orthopedic surgery  Neurosurgery  Cardiology  Procedures:  Antimicrobials:   Data Reviewed: I have personally reviewed following labs and imaging studies  CBC: Recent Labs  Lab 04/21/21 0637 04/22/21 0442 04/24/21 0451 04/25/21 0507 04/26/21 0435 04/27/21 0442 04/27/21 0642  WBC 17.3*   < > 25.4* 25.5* 27.0* 33.1* 34.1*  NEUTROABS 14.6*  --   --   --   --   --  29.4*  HGB 15.2*   < > 13.3 11.1* 11.7* 13.3 13.1  HCT 46.0   < > 40.7 34.4* 36.9 40.0 40.3  MCV 91.1   < > 91.9 93.5 94.1 91.1  92.2  PLT 833*   < > 879* 719* 792* 893* 925*   < > = values in this interval not displayed.   Basic Metabolic Panel: Recent Labs  Lab 04/23/21 0457 04/24/21 0451 04/25/21 0507 04/26/21 0435 04/27/21 0442  NA 138 135 139 138 140  K 4.1 3.8 3.7 3.7 2.6*  CL 103 98 103 102 93*  CO2 26 26 28 28 31   GLUCOSE 142* 138* 122* 121* 147*  BUN 23 18 23  27* 23  CREATININE 0.45 0.39* 0.39* 0.37* 0.55  CALCIUM 8.5* 8.6* 8.4* 8.5* 8.6*  MG  --  1.7 1.8 1.9 1.6*  PHOS  --  1.9* 1.9*  --  2.1*   GFR: CrCl cannot be calculated (Unknown ideal weight.). Liver Function Tests: Recent Labs  Lab 04/24/21 0451  ALBUMIN 3.6   No results for input(s): LIPASE, AMYLASE in the last 168 hours. No results for input(s): AMMONIA in the last 168 hours. Coagulation Profile: Recent Labs  Lab 04/21/21 0637  INR 1.1   Cardiac Enzymes: No results for input(s): CKTOTAL, CKMB, CKMBINDEX, TROPONINI in the last 168 hours. BNP (last 3 results) No results for input(s): PROBNP in the last 8760 hours. HbA1C: No results  for input(s): HGBA1C in the last 72 hours. CBG: No results for input(s): GLUCAP in the last 168 hours. Lipid Profile: No results for input(s): CHOL, HDL, LDLCALC, TRIG, CHOLHDL, LDLDIRECT in the last 72 hours. Thyroid Function Tests: No results for input(s): TSH, T4TOTAL, FREET4, T3FREE, THYROIDAB in the last 72 hours. Anemia Panel: No results for input(s): VITAMINB12, FOLATE, FERRITIN, TIBC, IRON, RETICCTPCT in the last 72 hours. Sepsis Labs: Recent Labs  Lab 04/26/21 0507 04/27/21 0442  PROCALCITON 0.30 0.66    Recent Results (from the past 240 hour(s))  Resp Panel by RT-PCR (Flu A&B, Covid) Nasopharyngeal Swab     Status: None   Collection Time: 04/21/21  6:37 AM   Specimen: Nasopharyngeal Swab; Nasopharyngeal(NP) swabs in vial transport medium  Result Value Ref Range Status   SARS Coronavirus 2 by RT PCR NEGATIVE NEGATIVE Final    Comment: (NOTE) SARS-CoV-2 target nucleic acids  are NOT DETECTED.  The SARS-CoV-2 RNA is generally detectable in upper respiratory specimens during the acute phase of infection. The lowest concentration of SARS-CoV-2 viral copies this assay can detect is 138 copies/mL. A negative result does not preclude SARS-Cov-2 infection and should not be used as the sole basis for treatment or other patient management decisions. A negative result may occur with  improper specimen collection/handling, submission of specimen other than nasopharyngeal swab, presence of viral mutation(s) within the areas targeted by this assay, and inadequate number of viral copies(<138 copies/mL). A negative result must be combined with clinical observations, patient history, and epidemiological information. The expected result is Negative.  Fact Sheet for Patients:  EntrepreneurPulse.com.au  Fact Sheet for Healthcare Providers:  IncredibleEmployment.be  This test is no t yet approved or cleared by the [place] FDA and  has been authorized for detection and/or diagnosis of SARS-CoV-2 by FDA under an Emergency Use Authorization (EUA). This EUA will remain  in effect (meaning this test can be used) for the duration of the COVID-19 declaration under Section 564(b)(1) of the Act, 21 U.S.C.section 360bbb-3(b)(1), unless the authorization is terminated  or revoked sooner.       Influenza A by PCR NEGATIVE NEGATIVE Final   Influenza B by PCR NEGATIVE NEGATIVE Final    Comment: (NOTE) The Xpert Xpress SARS-CoV-2/FLU/RSV plus assay is intended as an aid in the diagnosis of influenza from Nasopharyngeal swab specimens and should not be used as a sole basis for treatment. Nasal washings and aspirates are unacceptable for Xpert Xpress SARS-CoV-2/FLU/RSV testing.  Fact Sheet for Patients: EntrepreneurPulse.com.au  Fact Sheet for Healthcare Providers: IncredibleEmployment.be  This test is  not yet approved or cleared by the [place] FDA and has been authorized for detection and/or diagnosis of SARS-CoV-2 by FDA under an Emergency Use Authorization (EUA). This EUA will remain in effect (meaning this test can be used) for the duration of the COVID-19 declaration under Section 564(b)(1) of the Act, 21 U.S.C. section 360bbb-3(b)(1), unless the authorization is terminated or revoked.  Performed at [facility], [address] [place], [place] [address]   Surgical PCR screen     Status: None   Collection Time: 04/22/21  1:47 AM   Specimen: Nasal Mucosa; Nasal Swab  Result Value Ref Range Status   MRSA, PCR NEGATIVE NEGATIVE Final   Staphylococcus aureus NEGATIVE NEGATIVE Final    Comment: (NOTE) The Xpert SA Assay (FDA approved for NASAL specimens in patients 32 years of age and older), is one component of a comprehensive surveillance program. It is not intended to  diagnose infection nor to guide or monitor treatment. Performed at [facility], [address] [place], [place] [address]      Radiology Studies: DG Chest [place] 1 View  Result Date: 04/26/2021 CLINICAL DATA:  87 year old postop day 4 RIGHT hip unipolar arthroplasty, presenting with acute hypoxia. EXAM: PORTABLE CHEST 1 VIEW COMPARISON:  04/21/2021 and earlier. FINDINGS: Cardiac silhouette mildly to moderately enlarged for AP portable technique, unchanged. Interval development of moderately large BILATERAL pleural effusions and mild diffuse interstitial pulmonary edema since the examination 5 days ago. Associated consolidation in the lower lobes. IMPRESSION: 1. Acute CHF and/or fluid overload, with mild diffuse interstitial pulmonary edema and moderately large BILATERAL pleural effusions which are new since the examination 5 days ago. 2. Associated passive atelectasis (favored over pneumonia) in the lower lobes. Electronically Signed   By: Evangeline Dakin M.D.   On: 04/26/2021 11:50     Scheduled Meds: . atorvastatin  10 mg Oral Daily  . Chlorhexidine Gluconate Cloth  6 each Topical Daily  . diltiazem  120 mg Oral Daily  . docusate sodium  100 mg Oral BID  . enoxaparin (LOVENOX) injection  40 mg Subcutaneous Q24H  . escitalopram  10 mg Oral Once per day on Sun Tue Thu Sat  . escitalopram  5 mg Oral Once per day on Mon Wed Fri  . famotidine  20 mg Oral QHS  . memantine  5 mg Oral BID  . metoprolol succinate  25 mg Oral Daily  . multivitamin with minerals  1 tablet Oral Daily  . nystatin ointment  1 application Topical BID  . polyethylene glycol  17 g Oral Daily  . potassium chloride  40 mEq Oral Q4H  . ramipril  10 mg Oral Daily  . timolol  1 drop Both Eyes QPM   Continuous Infusions: . promethazine (PHENERGAN) injection (IM or IVPB) Stopped (04/21/21 2015)     LOS: 6 days   Enzo Bi, MD [facility]  If 7PM-7AM, please contact night-coverage [email or web address]  04/27/2021, 2:45 PM

## 2021-04-27 NOTE — Plan of Care (Signed)

## 2021-04-27 NOTE — Progress Notes (Signed)
PT Cancellation Note  Patient Details Name: Shelly Padilla MRN: [id number] DOB: 08-26-1920   Cancelled Treatment:    Reason Eval/Treat Not Completed: Medical issues which prohibited therapy   Chart reviewed.  Potassium 2.6 this am and outside of parameters for therapy.  Anticipate continued session tomorrow once addressed.   Chesley Noon 04/27/2021, 10:59 AM

## 2021-04-28 DIAGNOSIS — R1310 Dysphagia, unspecified: Secondary | ICD-10-CM

## 2021-04-28 DIAGNOSIS — Z7189 Other specified counseling: Secondary | ICD-10-CM

## 2021-04-28 DIAGNOSIS — E440 Moderate protein-calorie malnutrition: Secondary | ICD-10-CM

## 2021-04-28 DIAGNOSIS — J90 Pleural effusion, not elsewhere classified: Secondary | ICD-10-CM | POA: Diagnosis not present

## 2021-04-28 DIAGNOSIS — Z515 Encounter for palliative care: Secondary | ICD-10-CM

## 2021-04-28 DIAGNOSIS — F0390 Unspecified dementia without behavioral disturbance: Secondary | ICD-10-CM | POA: Diagnosis present

## 2021-04-28 DIAGNOSIS — J811 Chronic pulmonary edema: Secondary | ICD-10-CM | POA: Diagnosis not present

## 2021-04-28 DIAGNOSIS — Z66 Do not resuscitate: Secondary | ICD-10-CM

## 2021-04-28 DIAGNOSIS — J9601 Acute respiratory failure with hypoxia: Secondary | ICD-10-CM | POA: Diagnosis not present

## 2021-04-28 LAB — CBC
HCT: 37.7 % (ref 36.0–46.0)
Hemoglobin: 12.2 g/dL (ref 12.0–15.0)
MCH: 29.8 pg (ref 26.0–34.0)
MCHC: 32.4 g/dL (ref 30.0–36.0)
MCV: 92 fL (ref 80.0–100.0)
Platelets: 774 10*3/uL — ABNORMAL HIGH (ref 150–400)
RBC: 4.1 MIL/uL (ref 3.87–5.11)
RDW: 11.9 % (ref 11.5–15.5)
WBC: 26.2 10*3/uL — ABNORMAL HIGH (ref 4.0–10.5)
nRBC: 0 % (ref 0.0–0.2)

## 2021-04-28 LAB — BASIC METABOLIC PANEL
Anion gap: 8 (ref 5–15)
BUN: 39 mg/dL — ABNORMAL HIGH (ref 8–23)
CO2: 33 mmol/L — ABNORMAL HIGH (ref 22–32)
Calcium: 8.6 mg/dL — ABNORMAL LOW (ref 8.9–10.3)
Chloride: 103 mmol/L (ref 98–111)
Creatinine, Ser: 0.46 mg/dL (ref 0.44–1.00)
GFR, Estimated: >60 mL/min (ref >60)
Glucose, Bld: 171 mg/dL — ABNORMAL HIGH (ref 70–99)
Potassium: 3.3 mmol/L — ABNORMAL LOW (ref 3.5–5.1)
Sodium: 144 mmol/L (ref 135–145)

## 2021-04-28 LAB — MAGNESIUM: Magnesium: 2.2 mg/dL (ref 1.7–2.4)

## 2021-04-28 LAB — PROCALCITONIN: Procalcitonin: 0.35 ng/mL

## 2021-04-28 MED ORDER — POTASSIUM CHLORIDE 10 MEQ/100ML IV SOLN
10.0000 meq | INTRAVENOUS | Status: DC
  Administered 2021-04-28: 10 meq via INTRAVENOUS
  Filled 2021-04-28 (×2): qty 100

## 2021-04-28 MED ORDER — POTASSIUM CHLORIDE 20 MEQ PO PACK: 40.0000 meq | PACK | Freq: Once | ORAL | Status: DC

## 2021-04-28 MED ORDER — NEPRO/CARBSTEADY PO LIQD: 237.0000 mL | Freq: Two times a day (BID) | ORAL | Status: DC

## 2021-04-28 NOTE — Progress Notes (Signed)
SLP Cancellation Note  Patient Details Name: Shelly Padilla MRN: [id number] DOB: 12-20-1919   Cancelled treatment:       Reason Eval/Treat Not Completed: Patient's level of consciousness;Fatigue/lethargy limiting ability to participate (chart reviewed; consulted MD/NSG). Pt is mostly drowsy/lethargic and this has been her State this morning per NSG; po Meds are being held d/t lethargy per NSG report.  Recommend continue this current diet consistency BUT ONLY GIVE PO's WHEN FULLY AWAKE/ALERT TO PARTICIPATE SAFELY -- otherwise, HOLD PO's. NSG to monitor pt's appropriateness for oral intake at each meal time b/f giving meal. MD updated and agreed; stated Palliative Care has been ordered for pt/family discussion.  ST services will f/u w/ pt's status next 1-2 days. Recommend frequent oral care for hygiene and stimulation of swallowing, esp. B/f meals. Precautions posted.     Orinda Kenner, MS, CCC-SLP Speech Language Pathologist Rehab Services [phone] [facility] 04/28/2021, 11:39 AM

## 2021-04-28 NOTE — Progress Notes (Signed)
PROGRESS NOTE    Nysia Gardino  [id number] DOB: 09-Jun-1920 DOA: 04/21/2021 PCP: Venia Carbon, MD   Brief Narrative: Taken from H&P Fay Glatt is a 87 y.o. female with medical history significant of HTN, HLD, A fib on Eliquis, TIA, stroke, dementia, thrombocytosis, brain meningioma, depression, GERD, duodenitis, who presents with fall and right hip pain.  Pt has hx of dementia, her mental status was reportedly at baseline, but still cannot provide accurate medical history, therefore, most of the history is obtained by discussing the case with ED physician, per EMS report, and with the nursing staff.  History is limited.  Per report, patient had unwitnessed fall in facility.  She complains of lower back pain and right hip pain.  She was found to have acute, displaced subcapital right femoral neck fracture.  CT head with a small subarachnoid hemorrhage and chronic meningioma with some mass-effect.  Neurosurgery was consulted and they were recommending conservative management, repeat CT head today was without any new changes. Orthopedic was consulted and patient's son decided to proceed with surgery, patient underwent right hemiarthroplasty of hip yesterday.  Tolerated the procedure well.  Subjective: Pt was somnolent the whole day.  Not safe for oral intake today.    Palliative consulted and spoke with son, who decided for pt to discharge to SNF with hospice care.   Assessment & Plan:   Principal Problem:   Closed right hip fracture ([place]) Active Problems:   HTN (hypertension)   Thrombocytosis   TIA (transient ischemic attack)   Atrial fibrillation, chronic (HCC)   Fall   SAH (subarachnoid hemorrhage) (HCC)   Leukocytosis   GERD (gastroesophageal reflux disease)   HLD (hyperlipidemia)   Depression   Paroxysmal A-fib (HCC)   Malnutrition of moderate degree   Status post hip hemiarthroplasty  Closed right hip fracture S/p right hemiarthroplasty on 04/22/21 X-ray shows  acute, displaced subcapital right femoral neck fracture. --s/p surgery with Dr. Roland Rack Plan: --d/c to SNF with hospice --Tylenol PRN for pain --avoid opioids due to extreme somnolence --Follow up with [facility]o in 2 weeks Lovenox 40 mg SQ daily x 14 days at discharge TED hose BLE x 6 weeks Weight-Bearing as tolerated to right leg  Small subarachnoid hemorrhage.  Secondary to fall, patient was on Eliquis.  Repeat CT head was without any progression.  Neurosurgery would like to manage conservatively.  No neurologic deficit. Plan: --Hold eliquis for at least 2 weeks  Paroxysmal atrial fibrillation. --cont Cardizem and Toprol --hold Eliquis for at last 2 weeks due to subarachnoid hemorrhage  --will need a discussion with her cardiologist regarding continuation of anticoagulation given the high risk of falling  Dysphagia --SLP eval, found pt at "high risk for aspiration d/t the Cognitive decline/impact on her awareness of swallowing" --dysphagia 1 with nectar thick  Acute hypoxic respiratory failure 2/2 pulm edema and large bilateral pleural effusion 2/2 Fluid overload --noted to desat to 88%, and currently on 2-4L O2 --Likely due to fluid overload from IVF given for reduced oral hydration --s/p IV lasix 40 mg BID on 5/14 Plan: --Continue supplemental O2 to keep sats >=92%, wean as tolerated  Hypertension.   Blood pressure more elevated --cont home cardizem, Toprol and ramipril --IV hydralazine PRN  Acute on chronic Thrombocytosis:  This is chronic issue, but worsening, likely due to stress response --monitor for now  TIA (transient ischemic attack) and history of stroke --cont lipitor  Leukocytosis, improving no fever.  No source of infection identified.  Likely reactive  with some increase postsurgically.  UA on presentation with only mild proteinuria. --trend procal --f/u blood cx  GERD (gastroesophageal reflux disease) --cont Pepcid  [facility]  delirium -cont home Namenda --orientation and keep day/night cycles  HLD (hyperlipidemia) -Lipitor   Objective: Vitals:   04/28/21 1137 04/28/21 1533 04/28/21 1552 04/28/21 1600  BP: (!) 152/68 (!) 180/85 (!) 151/84   Pulse: 78 88 92   Resp: 20 (!) 28 (!) 22 20  Temp: 98.5 F (36.9 C) 97.8 F (36.6 C) 97.9 F (36.6 C)   TempSrc: Oral Oral Oral   SpO2: 96% 96%      Intake/Output Summary (Last 24 hours) at 04/28/2021 1659 Last data filed at 04/28/2021 1406 Gross per 24 hour  Intake 0 ml  Output 600 ml  Net -600 ml   There were no vitals filed for this visit.  Examination:  Constitutional: sleeping, difficult to awake HEENT: mouth wide open, mucosa dry, pooled secretion towards back of the throat CV: No cyanosis.   RESP: open mouthed breathing, on RA Extremities: No effusions, edema in BLE SKIN: warm, dry   DVT prophylaxis: SCD/Compression stockings Code Status: DNR  Family Communication: son updated at bedside today Status is: inpatient Dispo:   The patient is from: home Anticipated d/c is to: SNF with hospice Anticipated d/c date is: tomorrow    All the records are reviewed and case discussed with Care Management/Social Worker. Management plans discussed with the patient, nursing and they are in agreement.  Consultants:   Orthopedic surgery  Neurosurgery  Cardiology  Procedures:  Antimicrobials:   Data Reviewed: I have personally reviewed following labs and imaging studies  CBC: Recent Labs  Lab 04/25/21 0507 04/26/21 0435 04/27/21 0442 04/27/21 0642 04/28/21 0523  WBC 25.5* 27.0* 33.1* 34.1* 26.2*  NEUTROABS  --   --   --  29.4*  --   HGB 11.1* 11.7* 13.3 13.1 12.2  HCT 34.4* 36.9 40.0 40.3 37.7  MCV 93.5 94.1 91.1 92.2 92.0  PLT 719* 792* 893* 925* 341*   Basic Metabolic Panel: Recent Labs  Lab 04/24/21 0451 04/25/21 0507 04/26/21 0435 04/27/21 0442 04/28/21 0523  NA 135 139 138 140 144  K 3.8 3.7 3.7 2.6* 3.3*  CL 98 103  102 93* 103  CO2 26 28 28 31  33*  GLUCOSE 138* 122* 121* 147* 171*  BUN 18 23 27* 23 39*  CREATININE 0.39* 0.39* 0.37* 0.55 0.46  CALCIUM 8.6* 8.4* 8.5* 8.6* 8.6*  MG 1.7 1.8 1.9 1.6* 2.2  PHOS 1.9* 1.9*  --  2.1*  --    GFR: CrCl cannot be calculated (Unknown ideal weight.). Liver Function Tests: Recent Labs  Lab 04/24/21 0451  ALBUMIN 3.6   No results for input(s): LIPASE, AMYLASE in the last 168 hours. No results for input(s): AMMONIA in the last 168 hours. Coagulation Profile: No results for input(s): INR, PROTIME in the last 168 hours. Cardiac Enzymes: No results for input(s): CKTOTAL, CKMB, CKMBINDEX, TROPONINI in the last 168 hours. BNP (last 3 results) No results for input(s): PROBNP in the last 8760 hours. HbA1C: No results for input(s): HGBA1C in the last 72 hours. CBG: No results for input(s): GLUCAP in the last 168 hours. Lipid Profile: No results for input(s): CHOL, HDL, LDLCALC, TRIG, CHOLHDL, LDLDIRECT in the last 72 hours. Thyroid Function Tests: No results for input(s): TSH, T4TOTAL, FREET4, T3FREE, THYROIDAB in the last 72 hours. Anemia Panel: No results for input(s): VITAMINB12, FOLATE, FERRITIN, TIBC, IRON, RETICCTPCT in the  last 72 hours. Sepsis Labs: Recent Labs  Lab 04/26/21 0507 04/27/21 0442 04/28/21 0523  PROCALCITON 0.30 0.66 0.35    Recent Results (from the past 240 hour(s))  Resp Panel by RT-PCR (Flu A&B, Covid) Nasopharyngeal Swab     Status: None   Collection Time: 04/21/21  6:37 AM   Specimen: Nasopharyngeal Swab; Nasopharyngeal(NP) swabs in vial transport medium  Result Value Ref Range Status   SARS Coronavirus 2 by RT PCR NEGATIVE NEGATIVE Final    Comment: (NOTE) SARS-CoV-2 target nucleic acids are NOT DETECTED.  The SARS-CoV-2 RNA is generally detectable in upper respiratory specimens during the acute phase of infection. The lowest concentration of SARS-CoV-2 viral copies this assay can detect is 138 copies/mL. A negative  result does not preclude SARS-Cov-2 infection and should not be used as the sole basis for treatment or other patient management decisions. A negative result may occur with  improper specimen collection/handling, submission of specimen other than nasopharyngeal swab, presence of viral mutation(s) within the areas targeted by this assay, and inadequate number of viral copies(<138 copies/mL). A negative result must be combined with clinical observations, patient history, and epidemiological information. The expected result is Negative.  Fact Sheet for Patients:  EntrepreneurPulse.com.au  Fact Sheet for Healthcare Providers:  IncredibleEmployment.be  This test is no t yet approved or cleared by the [place] FDA and  has been authorized for detection and/or diagnosis of SARS-CoV-2 by FDA under an Emergency Use Authorization (EUA). This EUA will remain  in effect (meaning this test can be used) for the duration of the COVID-19 declaration under Section 564(b)(1) of the Act, 21 U.S.C.section 360bbb-3(b)(1), unless the authorization is terminated  or revoked sooner.       Influenza A by PCR NEGATIVE NEGATIVE Final   Influenza B by PCR NEGATIVE NEGATIVE Final    Comment: (NOTE) The Xpert Xpress SARS-CoV-2/FLU/RSV plus assay is intended as an aid in the diagnosis of influenza from Nasopharyngeal swab specimens and should not be used as a sole basis for treatment. Nasal washings and aspirates are unacceptable for Xpert Xpress SARS-CoV-2/FLU/RSV testing.  Fact Sheet for Patients: EntrepreneurPulse.com.au  Fact Sheet for Healthcare Providers: IncredibleEmployment.be  This test is not yet approved or cleared by the [place] FDA and has been authorized for detection and/or diagnosis of SARS-CoV-2 by FDA under an Emergency Use Authorization (EUA). This EUA will remain in effect (meaning this test can be used)  for the duration of the COVID-19 declaration under Section 564(b)(1) of the Act, 21 U.S.C. section 360bbb-3(b)(1), unless the authorization is terminated or revoked.  Performed at [facility], [address] [place], [place] [address]   Surgical PCR screen     Status: None   Collection Time: 04/22/21  1:47 AM   Specimen: Nasal Mucosa; Nasal Swab  Result Value Ref Range Status   MRSA, PCR NEGATIVE NEGATIVE Final   Staphylococcus aureus NEGATIVE NEGATIVE Final    Comment: (NOTE) The Xpert SA Assay (FDA approved for NASAL specimens in patients 31 years of age and older), is one component of a comprehensive surveillance program. It is not intended to diagnose infection nor to guide or monitor treatment. Performed at [facility], [address] [place], [place] [address]   Culture, blood (Routine X 2) w Reflex to ID Panel     Status: None (Preliminary result)   Collection Time: 04/27/21  2:05 PM   Specimen: BLOOD  Result Value Ref Range Status   Specimen Description BLOOD  RIGHT AC  Final   Special Requests BOTTLES DRAWN AEROBIC AND ANAEROBIC  BCLV  Final   Culture   Final    NO GROWTH < 24 HOURS Performed at [facility], [address] [place], [place] [address]    Report Status PENDING  Incomplete  Culture, blood (Routine X 2) w Reflex to ID Panel     Status: None (Preliminary result)   Collection Time: 04/27/21  2:12 PM   Specimen: BLOOD  Result Value Ref Range Status   Specimen Description BLOOD RIGHT HAND  Final   Special Requests   Final    BOTTLES DRAWN AEROBIC AND ANAEROBIC Blood Culture results may not be optimal due to an inadequate volume of blood received in culture bottles   Culture   Final    NO GROWTH < 24 HOURS Performed at [facility], [address] [place],  [address]    Report Status PENDING  Incomplete     Radiology Studies: No results found.  Scheduled Meds: . atorvastatin  10 mg Oral Daily  .  Chlorhexidine Gluconate Cloth  6 each Topical Daily  . diltiazem  120 mg Oral Daily  . docusate sodium  100 mg Oral BID  . enoxaparin (LOVENOX) injection  40 mg Subcutaneous Q24H  . escitalopram  10 mg Oral Once per day on Sun Tue Thu Sat  . escitalopram  5 mg Oral Once per day on Mon Wed Fri  . famotidine  20 mg Oral QHS  . feeding supplement (NEPRO CARB STEADY)  237 mL Oral BID BM  . memantine  5 mg Oral BID  . metoprolol succinate  25 mg Oral Daily  . multivitamin with minerals  1 tablet Oral Daily  . nystatin ointment  1 application Topical BID  . polyethylene glycol  17 g Oral Daily  . ramipril  10 mg Oral Daily  . timolol  1 drop Both Eyes QPM   Continuous Infusions: . potassium chloride 10 mEq (04/28/21 1543)  . promethazine (PHENERGAN) injection (IM or IVPB) Stopped (04/21/21 2015)     LOS: 7 days   Enzo Bi, MD [facility]  If 7PM-7AM, please contact night-coverage [email or web address]  04/28/2021, 4:59 PM

## 2021-04-28 NOTE — Progress Notes (Addendum)
Physical Therapy Treatment Patient Details Name: Shelly Padilla MRN: [id number] DOB: June 01, 1920 Today's Date: 04/28/2021    History of Present Illness Pt is a 87 y.o. female with medical history significant of HTN, HLD, A fib on Eliquis, TIA, stroke, dementia, thrombocytosis, brain meningioma, depression, GERD, duodenitis, who presents with fall and right hip pain.  Pt diagnosed with R femoral neck fracture and is s/p hemiarthroplasty.  MD assessment also includes:  small subarachnoid hemorrhage secondary to the fall and leukocytosis.    PT Comments    Pt asleep during session, unable to awaken despite BLE PROM and mouth care being performed by tech.  She does grimace slightly with activity but seems generally comfortable. Turned to right after session for pressure relief.   Follow Up Recommendations  SNF     Equipment Recommendations       Recommendations for Other Services       Precautions / Restrictions Precautions Precautions: Posterior Hip Precaution Booklet Issued: Yes (comment) Restrictions Weight Bearing Restrictions: Yes RLE Weight Bearing: Weight bearing as tolerated    Mobility  Bed Mobility                    Transfers                    Ambulation/Gait                 Stairs             Wheelchair Mobility    Modified Rankin (Stroke Patients Only)       Balance                                            Cognition Arousal/Alertness: Lethargic Behavior During Therapy: Flat affect Overall Cognitive Status: Impaired/Different from baseline                                        Exercises Other Exercises Other Exercises: BLE PROM supine    General Comments        Pertinent Vitals/Pain Pain Assessment: Faces Faces Pain Scale: Hurts a little bit Pain Descriptors / Indicators: Grimacing;Moaning Pain Intervention(s): Limited activity within patient's tolerance;Monitored during  session;Repositioned    Home Living                      Prior Function            PT Goals (current goals can now be found in the care plan section) Progress towards PT goals: Progressing toward goals    Frequency    7X/week      PT Plan Current plan remains appropriate    Co-evaluation              AM-PAC PT "6 Clicks" Mobility   Outcome Measure  Help needed turning from your back to your side while in a flat bed without using bedrails?: Total Help needed moving from lying on your back to sitting on the side of a flat bed without using bedrails?: Total Help needed moving to and from a bed to a chair (including a wheelchair)?: Total Help needed standing up from a chair using your arms (e.g., wheelchair or bedside chair)?: Total Help needed to walk in hospital room?: Total Help  needed climbing 3-5 steps with a railing? : Total 6 Click Score: 6    End of Session   Activity Tolerance: Patient limited by lethargy Patient left: in bed;with call bell/phone within reach;with bed alarm set;with SCD's reapplied;Other (comment) Nurse Communication: Mobility status PT Visit Diagnosis: History of falling (Z91.81);Unsteadiness on feet (R26.81);Other abnormalities of gait and mobility (R26.89);Muscle weakness (generalized) (M62.81);Pain Pain - Right/Left: Right Pain - part of body: Hip     Time: 5597-4163 PT Time Calculation (min) (ACUTE ONLY): 9 min  Charges:  $Therapeutic Exercise: 8-22 mins          Chesley Noon, PTA 04/28/21, 10:18 AM

## 2021-04-28 NOTE — Progress Notes (Addendum)
[facility] Liaison note:  New referral for [facility] hospice services at [facility] received from [place]. Patient information sent to referral. Hospice eligibility has been confirmed. Writer spoke via telephone to patient's son Shana Zavaleta to initiate education regarding hospice services, philosophy and team approach to care with understanding voiced. Possible discharge tomorrow 5/17. Will continue to follow through discharge. Thank you for this referral.  Flo Shanks BSN, RN, [place] [employer] [phone]

## 2021-04-28 NOTE — Progress Notes (Signed)
Subjective: 6 Days Post-Op Procedure(s) (LRB): ARTHROPLASTY BIPOLAR HIP (HEMIARTHROPLASTY) (Right)  Patient is resting comfortably.   Plan is for d/c to SNF when medically stable. WBC trending down, still elevated at 26.2. Blood Cx ordered, UA pending.  Objective: Vital signs in last 24 hours: Temp:  [97.8 F (36.6 C)-99.2 F (37.3 C)] 99.2 F (37.3 C) (05/16 0749) Pulse Rate:  [76-100] 83 (05/16 0749) Resp:  [16-22] 22 (05/16 0749) BP: (148-183)/(58-97) 157/77 (05/16 0749) SpO2:  [91 %-94 %] 92 % (05/16 0749)  Intake/Output from previous day: 05/15 0701 - 05/16 0700 In: 40 [P.O.:40] Out: 600 [Urine:600] Intake/Output this shift: No intake/output data recorded.  Recent Labs    04/26/21 0435 04/27/21 0442 04/27/21 0642 04/28/21 0523  HGB 11.7* 13.3 13.1 12.2   Recent Labs    04/27/21 0642 04/28/21 0523  WBC 34.1* 26.2*  RBC 4.37 4.10  HCT 40.3 37.7  PLT 925* 774*   Recent Labs    04/27/21 0442 04/28/21 0523  NA 140 144  K 2.6* 3.3*  CL 93* 103  CO2 31 33*  BUN 23 39*  CREATININE 0.55 0.46  GLUCOSE 147* 171*  CALCIUM 8.6* 8.6*   No results for input(s): LABPT, INR in the last 72 hours.  EXAM General - Patient is Alert and Confused Extremity - Neurovascular intact Sensation intact distally Intact pulses distally Dorsiflexion/Plantar flexion intact No cellulitis present Compartment soft  No signs of infection to the right hip incision site. Dressing - dressing C/D/I and no drainage Motor Function - intact, moving foot and toes well on exam.  Past Medical History:  Diagnosis Date  . Brain tumor ([place])    meningioma  . Glaucoma   . H/O paroxysmal supraventricular tachycardia    documented by Holter Monitor  . Hemorrhoids   . Hiatal hernia   . History of echocardiogram    a. 11/2017 Echo: EF 60-65%, no rwma, triv AI.  [place] Kitchen History of ovarian cyst   . HTN (hypertension)   . Insomnia   . PAF (paroxysmal atrial fibrillation) (HCC)    a.  CHA2DS2VASc = 4-->Eliquis.  . Palpitations   . Thrombocytosis     Assessment/Plan:   6 Days Post-Op Procedure(s) (LRB): ARTHROPLASTY BIPOLAR HIP (HEMIARTHROPLASTY) (Right) Principal Problem:   Closed right hip fracture (HCC) Active Problems:   HTN (hypertension)   Thrombocytosis   TIA (transient ischemic attack)   Atrial fibrillation, chronic ([place])   Fall   SAH (subarachnoid hemorrhage) (HCC)   Leukocytosis   GERD (gastroesophageal reflux disease)   HLD (hyperlipidemia)   Depression   Paroxysmal A-fib (HCC)   Malnutrition of moderate degree   Status post hip hemiarthroplasty  Estimated body mass index is 22.44 kg/m as calculated from the following:   Height as of 09/02/19: 5\' 7"  (1.702 m).   Weight as of 09/02/19: 65 kg. Advance diet Up with therapy  Patient has had a BM. VSS WBC trending down slightly today to 26.2. No signs of infection along surgery site. Continue to monitor Blood Cx ordered, currently negative.  UA is pending. Pain controlled CM to assist with discharge to SNF  Follow up with [facility] ortho in 2 weeks Lovenox 40 mg SQ daily x 14 days at discharge TED hose BLE x 6 weeks  DVT Prophylaxis - Lovenox, Foot Pumps and TED hose Weight-Bearing as tolerated to right leg  J. Cameron Proud, PA-C [place] 04/28/2021, 8:09 AM

## 2021-04-28 NOTE — Consult Note (Signed)
Consultation Note Date: 04/28/2021   Patient Name: Shelly Padilla  DOB: 1920-07-15  MRN: [id number]  Age / Sex: 87 y.o., female   PCP: Venia Carbon, MD Referring Physician: Enzo Bi, MD   REASON FOR CONSULTATION:Establishing goals of care  Palliative Care consult requested for goals of care discussion in this 87 y.o. female with a medical history significant for hypertension, TIA, dementia, depression, GERD, hyperlipidemia, atrial fibrillation (Eliquis) duodenitis. Patient presented from [facility] s/p fall with right hip pain.  Chest x-ray negative.  CT of C-spine is negative for acute abnormalities.  CT of head showed questionable SAH. x-ray of the T-spine and L-spine showed no acute abnormalities.  X-ray of right hip showed acute displaced subcapital right femoral neck fracture. Patient evaluated by orthopedic and is s/p right hip arthroplasty.  Clinical Assessment and Goals of Care: I have reviewed medical records including lab results, imaging, Epic notes, and MAR, received report from the bedside RN, and assessed the patient.   I met at the bedside with patient's son, Shelly Padilla to discuss diagnosis prognosis, [place], EOL wishes, disposition and options.  Patient assessed at bedside.  She is lethargic and minimally responsive.  Dry oral mucosa due to mouth breathing.  Poor p.o. intake.  I introduced Palliative Medicine as specialized medical care for people living with serious illness. It focuses on providing relief from the symptoms and stress of a serious illness. The goal is to improve quality of life for both the patient and the family.  Son verbalized understanding and appreciation.  We discussed a brief life review of the patient, along with her functional and nutritional status.  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  Her husband passed away in 2017-02-24.  Shelly Padilla is her only child.  Prior to admission patient became a resident of [facility] after her husband passed away in February 24, 2017.   She transition from assisted living to skilled after having a CVA in 02/24/18.  Son is tearful and expressing patient's decline over the past several months more specifically during [place].  Patient has been alert to herself and family with intermittent confusion to them.  Appetite waxes and wane with a noticeable decrease in amount of intake.  Son reports patient would often wander and walk during the night with walker assistance.  He states facility thinks patient was trying to get up during the night and had a fall resulting in hospitalization.  We discussed Her current illness and what it means in the larger context of Her on-going co-morbidities. Natural disease trajectory and expectations at EOL were discussed.  Shelly Padilla is realistic in his understanding of his mother's current condition.  He states he does not wish for her to suffer and does not like seeing her in her current state of health.  He shares his mother has lost motivation and drive since the death of his father.  He states patient does not want any life prolonging measures and have expressed on multiple occasions that she is ready when God calls her home.  Son shares over the past several days when patient does awaken she is having ongoing conversations with her deceased husband, father and brother.  A detailed discussion was had today regarding advanced directives.  Concepts specific to code status, artifical feeding and hydration, continued IV antibiotics and rehospitalization.  Shelly Padilla confirms wishes for DNR/DNI.  No artificial feeding/PEG no further medical work-up.  Education provided on MOST form. MOST completed as requested by son. Son outlined  wishes for  the following treatment decisions:  Cardiopulmonary Resuscitation: Do Not Attempt Resuscitation (DNR/No CPR)  Medical Interventions: Comfort Measures: Keep clean, warm, and dry. Use medication by any route, positioning, wound care, and other measures to relieve pain and suffering. Use  oxygen, suction and manual treatment of airway obstruction as needed for comfort. Do not transfer to the hospital unless comfort needs cannot be met in current location.  Antibiotics: Determine use of limitation of antibiotics when infection occurs  IV Fluids: No IV fluids (provide other measures to ensure comfort)  Feeding Tube: No feeding tube    The difference between a aggressive medical intervention and a palliative comfort care path were discussed at length. Values and goals of care important to patient and family were attempted to be elicited.    Son clear in expressed goals to focus on patient's comfort with no escalation of care. He shares he knows she is close to end-of-life and does not want her to suffer.   Hospice services outpatient were explained and offered. Son verbalized understanding and awareness of  hospice's goals and philosophy of care. He is requesting patient return to [facility] if possible with hospice support where staff are familiar with patient. Education provided on referral process.   Questions and concerns were addressed.   Family was encouraged to call with questions or concerns.  PMT will continue to support holistically as needed.   CODE STATUS: DNR  ADVANCE DIRECTIVES: Primary Decision Maker: Shelly Padilla (son/POA)    SYMPTOM MANAGEMENT: see below   Palliative Prophylaxis:   Aspiration, Bowel Regimen, Delirium Protocol, Eye Care, Frequent Pain Assessment, Oral Care, Palliative Wound Care and Turn Reposition  PSYCHO-SOCIAL/SPIRITUAL:  Support System: Family  Desire for further Chaplaincy support:No  Additional Recommendations (Limitations, Scope, Preferences):  Avoid Hospitalization, Minimize Medications, Initiate Comfort Feeding, No Artificial Feeding, No Lab Draws and focus on comfort with goal of discharging with hospice   Education on hospice/palliative    PAST MEDICAL HISTORY: Past Medical History:  Diagnosis Date  . Brain tumor ([place])     meningioma  . Glaucoma   . H/O paroxysmal supraventricular tachycardia    documented by Holter Monitor  . Hemorrhoids   . Hiatal hernia   . History of echocardiogram    a. 11/2017 Echo: EF 60-65%, no rwma, triv AI.  Marland Kitchen History of ovarian cyst   . HTN (hypertension)   . Insomnia   . PAF (paroxysmal atrial fibrillation) (HCC)    a. CHA2DS2VASc = 4-->Eliquis.  . Palpitations   . Thrombocytosis     ALLERGIES:  is allergic to amoxil [amoxicillin], codeine, pacerone  [amiodarone hcl], sulfa drugs cross reactors, and amlodipine.   MEDICATIONS:  Current Facility-Administered Medications  Medication Dose Route Frequency Provider Last Rate Last Admin  . acetaminophen (TYLENOL) tablet 1,000 mg  1,000 mg Oral Q6H PRN Enzo Bi, MD      . atorvastatin (LIPITOR) tablet 10 mg  10 mg Oral Daily Poggi, Marshall Cork, MD   10 mg at 04/27/21 0806  . bisacodyl (DULCOLAX) suppository 10 mg  10 mg Rectal Daily PRN Poggi, Marshall Cork, MD   10 mg at 04/27/21 1511  . Chlorhexidine Gluconate Cloth 2 % PADS 6 each  6 each Topical Daily Lorella Nimrod, MD   6 each at 04/27/21 [phone]  . diltiazem (CARDIZEM CD) 24 hr capsule 120 mg  120 mg Oral Daily Poggi, Marshall Cork, MD   120 mg at 04/27/21 0932  . diphenhydrAMINE (BENADRYL) 12.5 MG/5ML elixir 12.5-25 mg  12.5-25 mg Oral Q4H PRN Poggi, Excell Seltzer, MD      . docusate sodium (COLACE) capsule 100 mg  100 mg Oral BID Christena Flake, MD   100 mg at 04/27/21 2107  . enoxaparin (LOVENOX) injection 40 mg  40 mg Subcutaneous Q24H Poggi, Excell Seltzer, MD   40 mg at 04/28/21 0939  . escitalopram (LEXAPRO) tablet 10 mg  10 mg Oral Once per day on Sun Tue Thu Sat Christena Flake, MD   10 mg at 04/27/21 3527  . escitalopram (LEXAPRO) tablet 5 mg  5 mg Oral Once per day on Mon Wed Fri Poggi, John J, MD   5 mg at 04/25/21 0032  . famotidine (PEPCID) tablet 20 mg  20 mg Oral QHS Poggi, Excell Seltzer, MD   20 mg at 04/27/21 2107  . feeding supplement (NEPRO CARB STEADY) liquid 237 mL  237 mL Oral BID BM Darlin Priestly,  MD      . hydrALAZINE (APRESOLINE) injection 5 mg  5 mg Intravenous Q2H PRN Poggi, Excell Seltzer, MD   5 mg at 04/27/21 2041  . magnesium hydroxide (MILK OF MAGNESIA) suspension 30 mL  30 mL Oral Daily PRN Poggi, Excell Seltzer, MD   30 mL at 04/27/21 1100  . memantine (NAMENDA) tablet 5 mg  5 mg Oral BID Poggi, Excell Seltzer, MD   5 mg at 04/27/21 2107  . methocarbamol (ROBAXIN) tablet 500 mg  500 mg Oral Q8H PRN Poggi, Excell Seltzer, MD      . metoCLOPramide (REGLAN) tablet 5-10 mg  5-10 mg Oral Q8H PRN Poggi, Excell Seltzer, MD       Or  . metoCLOPramide (REGLAN) injection 5-10 mg  5-10 mg Intravenous Q8H PRN Poggi, Excell Seltzer, MD      . metoprolol succinate (TOPROL-XL) 24 hr tablet 25 mg  25 mg Oral Daily Poggi, Excell Seltzer, MD   25 mg at 04/27/21 5695  . multivitamin with minerals tablet 1 tablet  1 tablet Oral Daily Poggi, Excell Seltzer, MD   1 tablet at 04/27/21 3721  . nystatin ointment (MYCOSTATIN) 1 application  1 application Topical BID Poggi, Excell Seltzer, MD   1 application at 04/28/21 1030  . ondansetron (ZOFRAN) tablet 4 mg  4 mg Oral Q6H PRN Poggi, Excell Seltzer, MD       Or  . ondansetron (ZOFRAN) injection 4 mg  4 mg Intravenous Q6H PRN Poggi, Excell Seltzer, MD      . polyethylene glycol (MIRALAX / GLYCOLAX) packet 17 g  17 g Oral Daily Poggi, Excell Seltzer, MD   17 g at 04/27/21 0805  . potassium chloride (KLOR-CON) packet 40 mEq  40 mEq Oral Once Darlin Priestly, MD      . promethazine (PHENERGAN) 12.5 mg in sodium chloride 0.9 % 50 mL IVPB  12.5 mg Intravenous Q8H PRN Poggi, Excell Seltzer, MD   Stopped at 04/21/21 2015  . ramipril (ALTACE) capsule 10 mg  10 mg Oral Daily Poggi, Excell Seltzer, MD   10 mg at 04/27/21 0932  . senna-docusate (Senokot-S) tablet 1 tablet  1 tablet Oral BID PRN Poggi, Excell Seltzer, MD   1 tablet at 04/27/21 2107  . sodium phosphate (FLEET) 7-19 GM/118ML enema 1 enema  1 enema Rectal Once PRN Poggi, Excell Seltzer, MD      . timolol (TIMOPTIC) 0.5 % ophthalmic solution 1 drop  1 drop Both Eyes QPM Poggi, Excell Seltzer, MD   1 drop at 04/27/21 1750  VITAL  SIGNS: BP (!) 152/68 (BP Location: Right Arm)   Pulse 78   Temp 98.5 F (36.9 C) (Oral)   Resp 20   SpO2 96%  There were no vitals filed for this visit.  Estimated body mass index is 22.44 kg/m as calculated from the following:   Height as of 09/02/19: $RemoveBef'5\' 7"'etFppYiHJH$  (1.702 m).   Weight as of 09/02/19: 65 kg.  LABS: CBC:    Component Value Date/Time   WBC 26.2 (H) 04/28/2021 0523   HGB 12.2 04/28/2021 0523   HGB 13.3 08/03/2014 0455   HCT 37.7 04/28/2021 0523   HCT 38.5 08/03/2014 0455   PLT 774 (H) 04/28/2021 0523   PLT 467 (H) 08/03/2014 0455   Comprehensive Metabolic Panel:    Component Value Date/Time   NA 144 04/28/2021 0523   NA 139 01/08/2016 1210   NA 141 08/03/2014 0455   K 3.3 (L) 04/28/2021 0523   K 3.9 08/03/2014 0455   BUN 39 (H) 04/28/2021 0523   BUN 17 01/08/2016 1210   BUN 12 08/03/2014 0455   CREATININE 0.46 04/28/2021 0523   CREATININE 0.57 (L) 08/03/2014 0455   ALBUMIN 3.6 04/24/2021 0451   ALBUMIN 3.6 08/02/2014 1509     Review of Systems  Unable to perform ROS: Patient unresponsive   Physical Exam General: Lethargic, frail chronically-ill appearing Cardiovascular: regular rate and rhythm Pulmonary:diminished, mouth breathing, RA  Abdomen: soft, nontender, + bowel sounds Extremities: BLE edema, no joint deformities Skin: no rashes, warm and dry Neurological: lethargic, minimally responsive   Prognosis: Weeks in the setting of hip fracture s/p right humeral arthroplasty (5/Fan/22), hypertension, thrombocytosis, TIA, atrial fibrillation, SAH, GERD, HLD, depression, now on nutrition, poor p.o. intake, deconditioning, lethargic, hypoxic respiratory failure, pulmonary edema with large bilateral pleural effusions, and dementia.  Discharge Planning:  [place] with Hospice  Recommendations: . DNR/DNI-as confirmed by son. MOST form completed and placed on chart . Continue current plan of care, minimize medication, no escalation in  care. . Son realistic in expectations and understanding of patient's current illness and comorbidities.  Is clear and expressed wishes to focus on comfort and symptom management for what time patient has left.  Is requesting patient return to [facility] with outpatient hospice support. . TOC referral placed . PMT will continue to support and follow as needed. Please call team line with urgent needs.   Palliative Performance Scale: PPS 10%              Son expressed understanding and was in agreement with this plan.   Thank you for allowing the Palliative Medicine Team to assist in the care of this patient. Please utilize secure chat with additional questions, if there is no response within 30 minutes please call the above phone number.   Time In: 1315 Time Out: 1410 Time Total: 55 min.   Visit consisted of counseling and education dealing with the complex and emotionally intense issues of symptom management and palliative care in the setting of serious and potentially life-threatening illness.Greater than 50%  of this time was spent counseling and coordinating care related to the above assessment and plan.  Signed by:  Alda Lea, AGPCNP-BC Palliative Medicine Team  Phone: [phone] Pager: [phone] Amion: [place] Team providers are available by phone from 7am to 7pm daily and can be reached through the team cell phone.  Should this patient require assistance outside of these hours, please call the patient's attending physician.

## 2021-04-28 NOTE — TOC Progression Note (Addendum)
Transition of Care [facility]) - Progression Note    Patient Details  Name: Karissa Meenan MRN: [id number] Date of Birth: October 03, 1920  Transition of Care [facility]) CM/SW [place], RN Phone Number: 04/28/2021, 2:12 PM  Clinical Narrative:   TOC spoke with palliative and patient's son.  Plan is to go to [facility] with hospice.  Patient's son selected [facility] for Hospice and would like patient to return to [facility].  [facility] notified and is in to see patient and speak with son.  TOC contact information given, TOC will follow to discharge.  Addendum:  Spoke to Seth Bake at [facility], states patient can return to [facility] with [facility]  Expected Discharge Plan: Skilled Nursing Facility Barriers to Discharge: Continued Medical Work up  Expected Discharge Plan and Services Expected Discharge Plan: [place]   Discharge Planning Services: CM Consult Post Acute Care Choice: [place] Living arrangements for the past 2 months: [place]                 DME Arranged:  (SNF resident)         HH Arranged:  (SNF resident)           Social Determinants of Health (SDOH) Interventions    Readmission Risk Interventions No flowsheet data found.

## 2021-04-29 LAB — RESP PANEL BY RT-PCR (FLU A&B, COVID) ARPGX2
Influenza A by PCR: NEGATIVE
Influenza B by PCR: NEGATIVE
SARS Coronavirus 2 by RT PCR: NEGATIVE

## 2021-04-29 MED ORDER — FAMOTIDINE 20 MG PO TABS: 20.0000 mg | ORAL_TABLET | Freq: Every day | ORAL | Status: DC

## 2021-04-29 MED ORDER — FENTANYL CITRATE (PF) 100 MCG/2ML IJ SOLN
12.5000 ug | Freq: Once | INTRAMUSCULAR | Status: AC
  Administered 2021-04-29: 12.5 ug via INTRAVENOUS
  Filled 2021-04-29: qty 2

## 2021-04-29 NOTE — Progress Notes (Signed)
SLP Cancellation Note  Patient Details Name: Shelly Padilla MRN: [id number] DOB: September 27, 1920   Cancelled treatment:       Reason Eval/Treat Not Completed: SLP screened, no needs identified, will sign off (chart reviewed. Noted Palliative Care note, GOC.). Per Palliative Care note, pt is returning to [facility] under Hospice care. Recommend continue w/ current dysphagia diet and Nectar consistency liquids d/t increased risk for aspiration w/ oral intake; aspiration precautions, oral care, and feeding support. Recommend give po's only when pt is fully awake/alert to safely participate in oral intake. MD to reconsult ST services if any new needs while admitted.      Orinda Kenner, MS, CCC-SLP Speech Language Pathologist Rehab Services [phone] [facility] 04/29/2021, 10:12 AM

## 2021-04-29 NOTE — NC FL2 (Signed)
[place] LEVEL OF CARE SCREENING TOOL     IDENTIFICATION  Patient Name: Shelly Padilla Birthdate: Jun 29, 1920 Sex: female Admission Date (Current Location): 04/21/2021  [place] and [place] Number:  Engineering geologist and Address:  [facility], [address] [place],  [address]      Provider Number: [id number]  Attending Physician Name and Address:  Enzo Bi, MD  Relative Name and Phone Number:  Anaeli, [place] [facility])   [phone] [facility])    Current Level of Care: Hospital Recommended Level of Care: [place] Prior Approval Number:    Date Approved/Denied:   PASRR Number: [id number]  Discharge Plan: SNF (Hospice to follow-[facility])    Current Diagnoses: Patient Active Problem List   Diagnosis Date Noted  . Dysphagia 04/28/2021  . Acute hypoxemic respiratory failure ([place]) 04/28/2021  . Pulmonary edema 04/28/2021  . Bilateral pleural effusion 04/28/2021  . Dementia without behavioral disturbance ([place]) 04/28/2021  . Malnutrition of moderate degree 04/23/2021  . Status post hip hemiarthroplasty   . Paroxysmal A-fib ([place])   . Closed right hip fracture ([place]) 04/21/2021  . Fall 04/21/2021  . SAH (subarachnoid hemorrhage) ([place]) 04/21/2021  . Leukocytosis 04/21/2021  . GERD (gastroesophageal reflux disease) 04/21/2021  . HLD (hyperlipidemia) 04/21/2021  . Depression 04/21/2021  . Acute CVA (cerebrovascular accident) ([place]) 04/27/2019  . Altered mental status 11/27/2018  . Atrial fibrillation, chronic ([place]) 11/27/2018  . AMS (altered mental status) 11/27/2018  . MDD (major depressive disorder), single episode, moderate ([place]) 09/08/2018  . Duodenitis 07/14/2018  . 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 Carotid artery disease ([place]) 03/03/2018  . TIA (transient ischemic attack) 11/26/2017  . Chronic venous insufficiency 06/09/2016  . Right inguinal hernia 06/09/2016  . DD (diverticular disease) 04/10/2016  .  Glaucoma 04/10/2016  . Hemorrhoid 04/10/2016  . Bilateral ovarian cysts 03/18/2016  . Thrombocytosis   . Preventative health care 06/28/2015  . Varicose veins 01/31/2015  . Advance directive discussed with patient 12/26/2014  . Meningioma ([place]) 10/01/2014  . Insomnia 05/18/2014  . Episodic mood disorder ([place]) 10/18/2012  . Paroxysmal supraventricular tachycardia ([place]) 03/08/2012  . Essential hypertension 03/08/2012    Orientation RESPIRATION BLADDER Height & Weight     Self  O2 (4L O2) Incontinent,External catheter Weight:   Height:     BEHAVIORAL SYMPTOMS/MOOD NEUROLOGICAL BOWEL NUTRITION STATUS   (Oriented to self only)   Incontinent Diet (current dysphagia diet and Nectar consistency liquids d/t increased risk for aspiration w/ oral intake; aspiration precautions, oral care, and feeding support)  AMBULATORY STATUS COMMUNICATION OF NEEDS Skin   Extensive Assist Verbally  (R hip with surgical honeycomb dressing, sacral redness, rash/abrasion to scalp)                       Personal Care Assistance Level of Assistance  Bathing,Feeding,Dressing,Total care Bathing Assistance: Maximum assistance Feeding assistance: Maximum assistance Dressing Assistance: Maximum assistance Total Care Assistance: Maximum assistance   Functional Limitations Info  Sight,Hearing,Speech Sight Info: Adequate Hearing Info: Impaired Speech Info: Adequate    SPECIAL CARE FACTORS FREQUENCY  PT (By licensed PT),OT (By licensed OT)     PT Frequency: Hospice Care OT Frequency: Hospice Care            Contractures Contractures Info: Not present    Additional Factors Info  Code Status,Allergies Code Status Info: DNR Allergies Info: Amoxil (amoxicillin),  Codeine,  Pacerone  (amiodarone Hcl),  Sulfa Drugs Cross Reactors, Amlodipine  Current Medications (04/29/2021):  This is the current hospital active medication list Current Facility-Administered Medications  Medication Dose  Route Frequency Provider Last Rate Last Admin  . acetaminophen (TYLENOL) tablet 1,000 mg  1,000 mg Oral Q6H PRN Enzo Bi, MD      . bisacodyl (DULCOLAX) suppository 10 mg  10 mg Rectal Daily PRN Poggi, Marshall Cork, MD   10 mg at 04/28/21 1441  . diltiazem (CARDIZEM CD) 24 hr capsule 120 mg  120 mg Oral Daily Poggi, Marshall Cork, MD   120 mg at 04/27/21 0932  . diphenhydrAMINE (BENADRYL) 12.5 MG/5ML elixir 12.5-25 mg  12.5-25 mg Oral Q4H PRN Poggi, Marshall Cork, MD      . docusate sodium (COLACE) capsule 100 mg  100 mg Oral BID Corky Mull, MD   100 mg at 04/27/21 2107  . enoxaparin (LOVENOX) injection 40 mg  40 mg Subcutaneous Q24H Poggi, Marshall Cork, MD   40 mg at 04/29/21 0957  . escitalopram (LEXAPRO) tablet 10 mg  10 mg Oral Once per day on Sun Tue Thu Sat Corky Mull, MD   10 mg at 04/27/21 4098  . escitalopram (LEXAPRO) tablet 5 mg  5 mg Oral Once per day on Mon Wed Fri Poggi, John J, MD   5 mg at 04/25/21 1191  . famotidine (PEPCID) tablet 20 mg  20 mg Oral QHS Poggi, Marshall Cork, MD   20 mg at 04/27/21 2107  . hydrALAZINE (APRESOLINE) injection 5 mg  5 mg Intravenous Q2H PRN Poggi, Marshall Cork, MD   5 mg at 04/29/21 0101  . magnesium hydroxide (MILK OF MAGNESIA) suspension 30 mL  30 mL Oral Daily PRN Poggi, Marshall Cork, MD   30 mL at 04/27/21 1100  . memantine (NAMENDA) tablet 5 mg  5 mg Oral BID Poggi, Marshall Cork, MD   5 mg at 04/29/21 0956  . methocarbamol (ROBAXIN) tablet 500 mg  500 mg Oral Q8H PRN Poggi, Marshall Cork, MD      . metoCLOPramide (REGLAN) tablet 5-10 mg  5-10 mg Oral Q8H PRN Poggi, Marshall Cork, MD       Or  . metoCLOPramide (REGLAN) injection 5-10 mg  5-10 mg Intravenous Q8H PRN Poggi, Marshall Cork, MD      . metoprolol succinate (TOPROL-XL) 24 hr tablet 25 mg  25 mg Oral Daily Poggi, Marshall Cork, MD   25 mg at 04/27/21 0921  . nystatin ointment (MYCOSTATIN) 1 application  1 application Topical BID Poggi, Marshall Cork, MD   1 application at 47/82/95 [id number]  . ondansetron (ZOFRAN) tablet 4 mg  4 mg Oral Q6H PRN Poggi, Marshall Cork, MD        Or  . ondansetron (ZOFRAN) injection 4 mg  4 mg Intravenous Q6H PRN Poggi, Marshall Cork, MD      . promethazine (PHENERGAN) 12.5 mg in sodium chloride 0.9 % 50 mL IVPB  12.5 mg Intravenous Q8H PRN Poggi, Marshall Cork, MD   Stopped at 04/21/21 2015  . ramipril (ALTACE) capsule 10 mg  10 mg Oral Daily Poggi, Marshall Cork, MD   10 mg at 04/29/21 0956  . senna-docusate (Senokot-S) tablet 1 tablet  1 tablet Oral BID PRN Poggi, Marshall Cork, MD   1 tablet at 04/27/21 2107  . sodium phosphate (FLEET) 7-19 GM/118ML enema 1 enema  1 enema Rectal Once PRN Poggi, Marshall Cork, MD      . timolol (TIMOPTIC) 0.5 % ophthalmic solution 1 drop  1 drop Both Eyes QPM  Poggi, Marshall Cork, MD   1 drop at 04/28/21 1754     Discharge Medications: Please see discharge summary for a list of discharge medications.  Relevant Imaging Results:  Relevant Lab Results:   Additional Information SSN [id number]  Pete Pelt, RN

## 2021-04-29 NOTE — TOC Transition Note (Addendum)
Transition of Care [facility]) - CM/SW Discharge Note   Patient Details  Name: Shelly Padilla MRN: [id number] Date of Birth: 04-24-1920  Transition of Care [facility]) CM/SW Contact:  Pete Pelt, RN Phone Number: 04/29/2021, 10:26 AM   Clinical Narrative: TOC in to see son at bedside.  Son met with Palliative and Hospice yesterday, patient will be followed by [facility].  Per Seth Bake at [facility], patient can return today, aware of hospice status.  Son aware of patient's return, has no questions for TOC at this time.  Addendum, Patient will be transported to [facility] via [facility] per [place].  Son, Seth Bake at [facility] and care team aware.  Final next level of care: Skilled Nursing Facility Barriers to Discharge: Continued Medical Work up   Patient Goals and CMS Choice     Choice offered to / list presented to : NA  Discharge Placement                       Discharge Plan and Services   Discharge Planning Services: CM Consult Post Acute Care Choice: [place]          DME Arranged:  (SNF resident)         HH Arranged:  (SNF resident)          Social Determinants of Health (SDOH) Interventions     Readmission Risk Interventions No flowsheet data found.

## 2021-04-29 NOTE — Progress Notes (Signed)
Report called to Ventura Sellers, RN at [facility]. Discharge instructions sent to facility and [facility] aware of this. Pts son notified when pt was transferred to facility. EMS updated on pt status.   04/29/21 1446  Vitals  Temp 97.9 F (36.6 C)  Temp Source Oral  BP (!) 154/80  MAP (mmHg) 102  BP Location Right Arm  BP Method Automatic  Patient Position (if appropriate) Lying  Pulse Rate (!) 101  Pulse Rate Source Monitor  Resp 17  MEWS COLOR  MEWS Score Color Green  Oxygen Therapy  SpO2 96 %  O2 Device Nasal Cannula  O2 Flow Rate (L/min) 3 L/min

## 2021-04-29 NOTE — Progress Notes (Addendum)
[facility]) Hospital Liaison RN note:  Spoke with son, Cecilie Lowers, to provide update and answer any questions. Plan is for patient to discharge today to [facility]. [place] Liaison has faxed discharge summary to [facility].   Please send signed and completed DNR with patient. Please provide prescriptions as needed at discharge to ensure ongoing symptom management.  Please call with any hospice related questions or concerns.  Zandra Abts, RN [facility] Liaison [phone]

## 2021-04-29 NOTE — Discharge Summary (Signed)
Physician Discharge Summary   Shelly Padilla  female DOB: 1920-02-02  ZOX:[id number]  PCP: Karie SchwalbeLetvak, Richard I, MD  Admit date: 04/21/2021 Discharge date: 04/29/2021  Admitted From: SNF Disposition:  SNF with Hospice CODE STATUS: DNR  Discharge Instructions    No wound care   Complete by: As directed        Hospital Course:  For full details, please see H&P, progress notes, consult notes and ancillary notes.  Briefly,  Shelly Clossdelaide Smithis a 87 y.o.femalewith medical history significant ofHTN, HLD, A fib on Eliquis, TIA, stroke, dementia,thrombocytosis, brain meningioma, depression, GERD, duodenitis, who presented from SNF with fall and right hip pain.  Per report,patient hadunwitnessed fall in facility.She complains of lower back pain and right hip pain.  She was found to have acute, displaced subcapital right femoral neck fracture.  CT head with a small subarachnoid hemorrhage and chronic meningioma with some mass-effect.  Neurosurgery was consulted and they were recommending conservative management, repeat CT head was without any new changes.  Orthopedic was consulted and patient's son decided to proceed with surgery, patient underwent right hemiarthroplasty of hip.  Unfortunately, post-op, pt did not recover well, had dysphagia, and was too lethargic to have adequate oral intake.  Attempt to provide gentle hydration via IVF resulted in pulm edema and acute hypoxic respiratory failure.  Son remained confused.  Palliative consulted and son made the decision to have pt go back to her facility with hospice care.  Hospice care status --opioids pain meds were d/c'ed during hospitalization due to extreme somnolence, however, since pt is going to be hospice care, and if transitioned to comfort care, then can give pain meds and anti-anxiety for comfort, per facility provider's recommendations.  Closed right hip fracture S/p right hemiarthroplasty on 04/22/21 X-ray shows acute,  displaced subcapital right femoral neck fracture. --s/p surgery with Dr. Joice LoftsPoggi. --Tylenol PRN for pain --avoided opioids due to extreme somnolence while in the hospital. --[facility] in 2 weeks as needed. Weight-Bearing as tolerated to right leg.  Small subarachnoid hemorrhage.  Secondary to fall, patient was on Eliquis.  Repeat CT head was without any progression.  Neurosurgery would like to manage conservatively.   --Eliquis held and d/c'ed at discharge.  Paroxysmal atrial fibrillation. --cont Cardizem and Toprol --Eliquis held and d/c'ed at discharge.  Dysphagia --SLP eval, found pt at "high risk for aspiration d/t the Cognitive decline/impact on her awareness of swallowing" --dysphagia 1 with nectar thick  Acute hypoxic respiratory failure 2/2 pulm edema and large bilateral pleural effusion 2/2 Fluid overload --noted to desat to 88%, and currently on 2-4L O2 --Likely due to fluid overload from IVF given for reduced oral hydration. --s/p IV diuresis, however, didn't continue since pt hasn't been taking in much oral hydration. --Continue supplemental O2 to keep sats >=92%, wean as tolerated --pt is discharged to SNF with 4L O2.  Hypertension.   --cont home cardizem, Toprol and ramipril during hospitalization, however, pt became too somnolent to take oral medications towards the end.  Ramipril d/c'ed at discharge.    Acute on chronic Thrombocytosis: This is chronic issue, but worsening, likely due to stress response  TIA (transient ischemic attack)and history of stroke --statin d/c'ed at discharge due to hospice status  Leukocytosis, improving no fever. No source of infection identified. Likely reactive with some increase postsurgically.  UA on presentation with only mild proteinuria. Pt was not started on abx.  GERD (gastroesophageal reflux disease) --cont Pepcid  [facility] delir[facility] -cont home Namenda  during hospitalization, d/c'ed at  discharge due to hospice status  HLD (hyperlipidemia) --statin d/c'ed at discharge due to hospice status  chronic meningioma   Discharge Diagnoses:  Principal Problem:   Closed right hip fracture ([place]) Active Problems:   Essential hypertension   Thrombocytosis   TIA (transient ischemic attack)   Atrial fibrillation, chronic ([place])   Fall   SAH (subarachnoid hemorrhage) ([facility])   Leukocytosis   GERD (gastroesophageal reflux disease)   HLD (hyperlipidemia)   Depression   Paroxysmal A-fib ([facility])   Malnutrition of moderate degree   Status post hip hemiarthroplasty   Dysphagia   Acute hypoxemic respiratory failure ([facility])   Pulmonary edema   Bilateral pleural effusion   Dementia without behavioral disturbance ([place])   30 Day Unplanned Readmission Risk Score   Flowsheet Row ED to Hosp-Admission (Current) from 04/21/2021 in [place] (1A)  30 Day Unplanned Readmission Risk Score (%) 19.53 Filed at 04/29/2021 0801     This score is the patient's risk of an unplanned readmission within 30 days of being discharged (0 -100%). The score is based on dignosis, age, lab data, medications, orders, and past utilization.   Low:  0-14.9   Medium: 15-21.9   High: 22-29.9   Extreme: 30 and above        Discharge Instructions:  Allergies as of 04/29/2021      Reactions   Amoxil [amoxicillin] Anxiety   Codeine    Pacerone  [amiodarone Hcl] Nausea And Vomiting   Sulfa Drugs Cross Reactors    Amlodipine Other (See Comments)   Mild edema      Medication List    STOP taking these medications   apixaban 2.5 MG Tabs tablet Commonly known as: ELIQUIS   aspirin 81 MG EC tablet   atorvastatin 10 MG tablet Commonly known as: Lipitor   CENTRUM SILVER PO   escitalopram 10 MG tablet Commonly known as: LEXAPRO   escitalopram 5 MG tablet Commonly known as: LEXAPRO   memantine 5 MG tablet Commonly known as: NAMENDA   metoprolol tartrate 25 MG  tablet Commonly known as: LOPRESSOR   ramipril 10 MG capsule Commonly known as: ALTACE   traMADol 50 MG tablet Commonly known as: ULTRAM     TAKE these medications   acetaminophen 500 MG tablet Commonly known as: TYLENOL Take 1,000 mg by mouth 3 (three) times daily.   diltiazem 120 MG 24 hr capsule Commonly known as: CARDIZEM CD Take 120 mg by mouth daily.   famotidine 20 MG tablet Commonly known as: Pepcid Take 1 tablet (20 mg total) by mouth at bedtime.   metoprolol succinate 25 MG 24 hr tablet Commonly known as: TOPROL-XL Take 25 mg by mouth daily.   nystatin ointment Commonly known as: MYCOSTATIN Apply 1 application topically in the morning and at bedtime. Apply 1 application topically to the groin area every day and evening shift for yeast/rash/redness   polyethylene glycol 17 g packet Commonly known as: MIRALAX / GLYCOLAX Take 17 g by mouth daily.   sennosides-docusate sodium 8.6-50 MG tablet Commonly known as: SENOKOT-S Take 1 tablet by mouth daily as needed for constipation.   timolol 0.5 % ophthalmic solution Commonly known as: TIMOPTIC Place 1 drop into both eyes every evening.        Follow-up Information    Poggi, Marshall Cork, MD. Schedule an appointment as soon as possible for a visit in 2 week(s).   Specialty: Orthopedic Surgery Why: For staple removal Contact information:  [address] [facility] [place] [address] [phone]               Allergies  Allergen Reactions  . Amoxil [Amoxicillin] Anxiety  . Codeine   . Pacerone  [Amiodarone Hcl] Nausea And Vomiting  . Sulfa Drugs Cross Reactors   . Amlodipine Other (See Comments)    Mild edema     The results of significant diagnostics from this hospitalization (including imaging, microbiology, ancillary and laboratory) are listed below for reference.   Consultations:   Procedures/Studies: DG Chest 1 View  Result Date: 04/21/2021 CLINICAL DATA:  Pain after  fall. EXAM: CHEST  1 VIEW COMPARISON:  Prior chest radiographs 11/27/2018 and earlier. FINDINGS: Heart size within normal limits. No appreciable airspace consolidation. No evidence of pleural effusion or pneumothorax. No acute bony abnormality identified. IMPRESSION: No evidence of active cardiopulmonary disease. Electronically Signed   By: Jackey Loge DO   On: 04/21/2021 07:53   DG Thoracic Spine 2 View  Result Date: 04/21/2021 CLINICAL DATA:  Pain after fall. EXAM: THORACIC SPINE 2 VIEWS COMPARISON:  Prior chest radiographs 11/27/2018 and earlier. FINDINGS: Mild thoracic dextrocurvature. No significant spondylolisthesis. No appreciable thoracic vertebral compression fracture. Thoracic spondylosis with advanced multilevel disc space narrowing and multilevel ventrolateral osteophytes. IMPRESSION: No appreciable thoracic vertebral compression fracture. Mild thoracic dextrocurvature. Advanced thoracic spondylosis. Electronically Signed   By: Jackey Loge DO   On: 04/21/2021 07:55   DG Lumbar Spine 2-3 Views  Result Date: 04/21/2021 CLINICAL DATA:  Pain after fall. EXAM: LUMBAR SPINE - 2-3 VIEW COMPARISON:  CT abdomen/pelvis 11/27/2018. FINDINGS: Five lumbar vertebrae. Mild lumbar levocurvature.  Trace L2-L3 grade 1 retrolisthesis. No lumbar vertebral compression fracture. Advanced lumbar spondylosis with multilevel disc space narrowing, anterior and posterior osteophytes and multilevel facet arthrosis IMPRESSION: No lumbar vertebral compression fracture. Mild lumbar levocurvature. Trace L2-L3 grade 1 retrolisthesis. Advanced lumbar spondylosis. Electronically Signed   By: Jackey Loge DO   On: 04/21/2021 07:57   CT HEAD WO CONTRAST  Result Date: 04/22/2021 CLINICAL DATA:  Subarachnoid hemorrhage EXAM: CT HEAD WITHOUT CONTRAST TECHNIQUE: Contiguous axial images were obtained from the base of the skull through the vertex without intravenous contrast. COMPARISON:  Apr 21, 2021 FINDINGS: Brain: There is stable  atrophy, asymmetric involving portions of the left frontal and parietal lobes. There is a small amount of subarachnoid hemorrhage in the posterior right frontal lobe, slightly more diffuse but overall likely unchanged in volume compared to 1 day prior. No new foci of subarachnoid hemorrhage appreciable. There is again noted an apparent meningioma in the right occipital region containing a small amount of calcification. This mass measures 3.7 x 3.3 x 3.2 cm, unchanged from 1 day prior lowering for slight differences in scan plane. There is stable mass effect on the atrium of the right lateral ventricle in on the immediate sulci. No new mass evident. No appreciable subdural or epidural fluid collection. No midline shift. There is underlying atrophy with decreased attenuation in portions of the centra semiovale consistent with periventricular small vessel disease. Stable small infarct in the region of the genu of the right internal capsule again noted. There is evidence of prior infarct in the left cerebellum at the level of the lateral left dentate nucleus. No new infarct compared to 1 day prior. Vascular: No hyperdense vessel. There is calcification in each carotid siphon region. Skull: Bony calvarium appear intact. Sinuses/Orbits: There is mucosal thickening in several ethmoid air cells. Orbits appear symmetric bilaterally. Other:  Mastoid air cells on the right are clear. There is opacification in several left-sided mastoid air cells. IMPRESSION: Small amount of hemorrhage in the posterior right frontal lobe persists. No new foci of hemorrhage. Persistent right occipital region meningioma with localized mass effect, similar to 1 day prior. Atrophy with periventricular small vessel disease. Prior infarcts in the lateral dentate nucleus of the cerebellum on the left and in the region of the genu of the right internal capsule. No new infarct compared to 1 day prior evident. There are foci of arterial vascular  calcification. There is mucosal thickening in several ethmoid air cells. There is opacification in several left-sided mastoid air cells. Electronically Signed   By: Lowella Grip III M.D.   On: 04/22/2021 08:09   CT HEAD WO CONTRAST  Result Date: 04/21/2021 CLINICAL DATA:  Head trauma, minor. Neck trauma. Additional history provided: Unwitnessed fall, patient reports low back pain, right hip pain, small laceration noted to back of scalp. History of atrial fibrillation on Eliquis. EXAM: CT HEAD WITHOUT CONTRAST CT CERVICAL SPINE WITHOUT CONTRAST TECHNIQUE: Multidetector CT imaging of the head and cervical spine was performed following the standard protocol without intravenous contrast. Multiplanar CT image reconstructions of the cervical spine were also generated. COMPARISON:  Prior head CT 09/02/2019. Prior brain MRI 11/29/2018. Cervical spine CT 02/19/2016. FINDINGS: CT HEAD FINDINGS Brain: Mild-to-moderate generalized cerebral atrophy. Small volume acute subarachnoid hemorrhage overlying the right frontal lobe (series 3, image 19) (series 4, image 31) (series 3, image 20). Redemonstrated partially calcified extra-axial dural-based mass overlying the right temporal occipital lobes compatible with meningioma. The mass has increased in size as compared to the prior head CT of 09/02/2019, now measuring 3.5 x 2.7 cm in transaxial (previously 3.0 x 2.4 cm). As before, there is mass effect upon the underlying right temporal occipital lobes with partial effacement of the right lateral ventricle. Mild surrounding vasogenic edema. No midline shift. Chronic lacunar infarct within the right basal ganglia, new from the prior head CT of 09/02/2019). Stable background moderate chronic small vessel ischemic disease within the cerebral white matter. A known chronic lacunar infarct within the left cerebellar hemisphere was better appreciated on the prior brain MRI of 11/29/2018. Unchanged mild extra-axial CSF density  prominence overlying the left cerebellar hemisphere. No demarcated cortical infarct. Vascular: Calcifications. Skull: Normal. Negative for fracture or focal lesion. Sinuses/Orbits: Visualized orbits show no acute finding. Minimal bilateral ethmoid sinus mucosal thickening. Other: Trace left mastoid effusion. Right frontotemporal scalp soft tissue swelling CT CERVICAL SPINE FINDINGS Alignment: Straightening of the expected cervical lordosis. No significant spondylolisthesis. Skull base and vertebrae: The basion-dental and atlanto-dental intervals are maintained.No evidence of acute fracture to the cervical spine. Soft tissues and spinal canal: No prevertebral fluid or swelling. No visible canal hematoma. Disc levels: Cervical spondylosis with advanced multilevel disc space narrowing, disc bulges, endplate spurring, uncovertebral hypertrophy and facet arthrosis. No appreciable high-grade spinal canal stenosis. Multilevel bony neural foraminal narrowing. Upper chest: No consolidation within the imaged lung apices. No visible pneumothorax. Other: 2.4 cm left thyroid lobe nodule. Thyroid ultrasound follow-up is not acquired given the patient's advanced age. These results were called by telephone at the time of interpretation on 04/21/2021 at 8:22 am to provider Dr. Corky Downs, who verbally acknowledged these results. IMPRESSION: CT head: 1. Small volume acute subarachnoid hemorrhage overlying the right frontal lobe. 2. Right frontotemporal scalp soft tissue swelling. 3. A meningioma overlying the right temporal occipital lobes has increased in size as compared to the head CT  of 09/02/2019, now measuring 3.5 x 2.7 cm in transaxial dimensions. As before, there is mass effect upon the underlying right temporal occipital lobes with partial effacement of the right lateral ventricle and mild surrounding vasogenic edema. 4. Chronic right basal ganglia lacunar infarct, new from the prior exam. 5. Otherwise stable non-contrast CT  appearance of the brain with generalized cerebral atrophy and chronic small vessel ischemic disease with chronic lacunar infarcts, as described. 6. Trace left mastoid effusion. CT cervical spine: 1. No evidence of acute fracture to the cervical spine. 2. Nonspecific straightening of the expected cervical lordosis. 3. Cervical spondylosis, as described Electronically Signed   By: Kellie Simmering DO   On: 04/21/2021 08:24   CT CERVICAL SPINE WO CONTRAST  Result Date: 04/21/2021 CLINICAL DATA:  Head trauma, minor. Neck trauma. Additional history provided: Unwitnessed fall, patient reports low back pain, right hip pain, small laceration noted to back of scalp. History of atrial fibrillation on Eliquis. EXAM: CT HEAD WITHOUT CONTRAST CT CERVICAL SPINE WITHOUT CONTRAST TECHNIQUE: Multidetector CT imaging of the head and cervical spine was performed following the standard protocol without intravenous contrast. Multiplanar CT image reconstructions of the cervical spine were also generated. COMPARISON:  Prior head CT 09/02/2019. Prior brain MRI 11/29/2018. Cervical spine CT 02/19/2016. FINDINGS: CT HEAD FINDINGS Brain: Mild-to-moderate generalized cerebral atrophy. Small volume acute subarachnoid hemorrhage overlying the right frontal lobe (series 3, image 19) (series 4, image 31) (series 3, image 20). Redemonstrated partially calcified extra-axial dural-based mass overlying the right temporal occipital lobes compatible with meningioma. The mass has increased in size as compared to the prior head CT of 09/02/2019, now measuring 3.5 x 2.7 cm in transaxial (previously 3.0 x 2.4 cm). As before, there is mass effect upon the underlying right temporal occipital lobes with partial effacement of the right lateral ventricle. Mild surrounding vasogenic edema. No midline shift. Chronic lacunar infarct within the right basal ganglia, new from the prior head CT of 09/02/2019). Stable background moderate chronic small vessel ischemic  disease within the cerebral white matter. A known chronic lacunar infarct within the left cerebellar hemisphere was better appreciated on the prior brain MRI of 11/29/2018. Unchanged mild extra-axial CSF density prominence overlying the left cerebellar hemisphere. No demarcated cortical infarct. Vascular: Calcifications. Skull: Normal. Negative for fracture or focal lesion. Sinuses/Orbits: Visualized orbits show no acute finding. Minimal bilateral ethmoid sinus mucosal thickening. Other: Trace left mastoid effusion. Right frontotemporal scalp soft tissue swelling CT CERVICAL SPINE FINDINGS Alignment: Straightening of the expected cervical lordosis. No significant spondylolisthesis. Skull base and vertebrae: The basion-dental and atlanto-dental intervals are maintained.No evidence of acute fracture to the cervical spine. Soft tissues and spinal canal: No prevertebral fluid or swelling. No visible canal hematoma. Disc levels: Cervical spondylosis with advanced multilevel disc space narrowing, disc bulges, endplate spurring, uncovertebral hypertrophy and facet arthrosis. No appreciable high-grade spinal canal stenosis. Multilevel bony neural foraminal narrowing. Upper chest: No consolidation within the imaged lung apices. No visible pneumothorax. Other: 2.4 cm left thyroid lobe nodule. Thyroid ultrasound follow-up is not acquired given the patient's advanced age. These results were called by telephone at the time of interpretation on 04/21/2021 at 8:22 am to provider Dr. Corky Downs, who verbally acknowledged these results. IMPRESSION: CT head: 1. Small volume acute subarachnoid hemorrhage overlying the right frontal lobe. 2. Right frontotemporal scalp soft tissue swelling. 3. A meningioma overlying the right temporal occipital lobes has increased in size as compared to the head CT of 09/02/2019, now measuring 3.5 x 2.7 cm  in transaxial dimensions. As before, there is mass effect upon the underlying right temporal occipital  lobes with partial effacement of the right lateral ventricle and mild surrounding vasogenic edema. 4. Chronic right basal ganglia lacunar infarct, new from the prior exam. 5. Otherwise stable non-contrast CT appearance of the brain with generalized cerebral atrophy and chronic small vessel ischemic disease with chronic lacunar infarcts, as described. 6. Trace left mastoid effusion. CT cervical spine: 1. No evidence of acute fracture to the cervical spine. 2. Nonspecific straightening of the expected cervical lordosis. 3. Cervical spondylosis, as described Electronically Signed   By: Jackey Loge DO   On: 04/21/2021 08:24   DG Chest Port 1 View  Result Date: 04/26/2021 CLINICAL DATA:  87 year old postop day 4 RIGHT hip unipolar arthroplasty, presenting with acute hypoxia. EXAM: PORTABLE CHEST 1 VIEW COMPARISON:  04/21/2021 and earlier. FINDINGS: Cardiac silhouette mildly to moderately enlarged for AP portable technique, unchanged. Interval development of moderately large BILATERAL pleural effusions and mild diffuse interstitial pulmonary edema since the examination 5 days ago. Associated consolidation in the lower lobes. IMPRESSION: 1. Acute CHF and/or fluid overload, with mild diffuse interstitial pulmonary edema and moderately large BILATERAL pleural effusions which are new since the examination 5 days ago. 2. Associated passive atelectasis (favored over pneumonia) in the lower lobes. Electronically Signed   By: Hulan Saas M.D.   On: 04/26/2021 11:50   DG HIP UNILAT W OR W/O PELVIS 2-3 VIEWS RIGHT  Result Date: 04/22/2021 CLINICAL DATA:  Postop. EXAM: DG HIP (WITH OR WITHOUT PELVIS) 2-3V RIGHT COMPARISON:  Hip radiograph yesterday. FINDINGS: Unipolar right hip arthroplasty in expected alignment. No periprosthetic lucency or fracture. Recent postsurgical change includes air and edema in the soft tissues and lateral skin staples. Exam is otherwise unchanged. IMPRESSION: Right hip arthroplasty without  immediate postoperative complication. Electronically Signed   By: Narda Rutherford M.D.   On: 04/22/2021 17:54   DG Hip Unilat With Pelvis 2-3 Views Right  Result Date: 04/21/2021 CLINICAL DATA:  Pain after fall. EXAM: DG HIP (WITH OR WITHOUT PELVIS) 2-3V RIGHT COMPARISON:  Radiographs of the right hip 11/26/2018. FINDINGS: Acute, displaced subcapital right femoral neck fracture. No other acute fracture is identified. Bilateral femoroacetabular joint degenerative changes. IMPRESSION: Acute, displaced subcapital right femoral neck fracture. Electronically Signed   By: Jackey Loge DO   On: 04/21/2021 08:05      Labs: BNP (last 3 results) No results for input(s): BNP in the last 8760 hours. Basic Metabolic Panel: Recent Labs  Lab 04/24/21 0451 04/25/21 0507 04/26/21 0435 04/27/21 0442 04/28/21 0523  NA 135 139 138 140 144  K 3.8 3.7 3.7 2.6* 3.3*  CL 98 103 102 93* 103  CO2 26 28 28 31  33*  GLUCOSE 138* 122* 121* 147* 171*  BUN 18 23 27* 23 39*  CREATININE 0.39* 0.39* 0.37* 0.55 0.46  CALCIUM 8.6* 8.4* 8.5* 8.6* 8.6*  MG 1.7 1.8 1.9 1.6* 2.2  PHOS 1.9* 1.9*  --  2.1*  --    Liver Function Tests: Recent Labs  Lab 04/24/21 0451  ALBUMIN 3.6   No results for input(s): LIPASE, AMYLASE in the last 168 hours. No results for input(s): AMMONIA in the last 168 hours. CBC: Recent Labs  Lab 04/25/21 0507 04/26/21 0435 04/27/21 0442 04/27/21 0642 04/28/21 0523  WBC 25.5* 27.0* 33.1* 34.1* 26.2*  NEUTROABS  --   --   --  29.4*  --   HGB 11.1* 11.7* 13.3 13.1 12.2  HCT  34.4* 36.9 40.0 40.3 37.7  MCV 93.5 94.1 91.1 92.2 92.0  PLT 719* 792* 893* 925* 774*   Cardiac Enzymes: No results for input(s): CKTOTAL, CKMB, CKMBINDEX, TROPONINI in the last 168 hours. BNP: Invalid input(s): POCBNP CBG: No results for input(s): GLUCAP in the last 168 hours. D-Dimer No results for input(s): DDIMER in the last 72 hours. Hgb A1c No results for input(s): HGBA1C in the last 72 hours. Lipid  Profile No results for input(s): CHOL, HDL, LDLCALC, TRIG, CHOLHDL, LDLDIRECT in the last 72 hours. Thyroid function studies No results for input(s): TSH, T4TOTAL, T3FREE, THYROIDAB in the last 72 hours.  Invalid input(s): FREET3 Anemia work up No results for input(s): VITAMINB12, FOLATE, FERRITIN, TIBC, IRON, RETICCTPCT in the last 72 hours. Urinalysis    Component Value Date/Time   COLORURINE YELLOW (A) 04/21/2021 0709   APPEARANCEUR CLEAR (A) 04/21/2021 0709   APPEARANCEUR Clear 08/02/2014 1525   LABSPEC 1.014 04/21/2021 0709   LABSPEC 1.006 08/02/2014 1525   PHURINE 7.0 04/21/2021 0709   GLUCOSEU NEGATIVE 04/21/2021 0709   GLUCOSEU Negative 08/02/2014 1525   HGBUR NEGATIVE 04/21/2021 0709   BILIRUBINUR NEGATIVE 04/21/2021 0709   BILIRUBINUR negative 01/30/2015 1602   BILIRUBINUR Negative 08/02/2014 1525   KETONESUR NEGATIVE 04/21/2021 0709   PROTEINUR 30 (A) 04/21/2021 0709   UROBILINOGEN 0.2 01/30/2015 1602   NITRITE NEGATIVE 04/21/2021 0709   LEUKOCYTESUR NEGATIVE 04/21/2021 0709   LEUKOCYTESUR 1+ 08/02/2014 1525   Sepsis Labs Invalid input(s): PROCALCITONIN,  WBC,  LACTICIDVEN Microbiology Recent Results (from the past 240 hour(s))  Resp Panel by RT-PCR (Flu A&B, Covid) Nasopharyngeal Swab     Status: None   Collection Time: 04/21/21  6:37 AM   Specimen: Nasopharyngeal Swab; Nasopharyngeal(NP) swabs in vial transport medium  Result Value Ref Range Status   SARS Coronavirus 2 by RT PCR NEGATIVE NEGATIVE Final    Comment: (NOTE) SARS-CoV-2 target nucleic acids are NOT DETECTED.  The SARS-CoV-2 RNA is generally detectable in upper respiratory specimens during the acute phase of infection. The lowest concentration of SARS-CoV-2 viral copies this assay can detect is 138 copies/mL. A negative result does not preclude SARS-Cov-2 infection and should not be used as the sole basis for treatment or other patient management decisions. A negative result may occur with   improper specimen collection/handling, submission of specimen other than nasopharyngeal swab, presence of viral mutation(s) within the areas targeted by this assay, and inadequate number of viral copies(<138 copies/mL). A negative result must be combined with clinical observations, patient history, and epidemiological information. The expected result is Negative.  Fact Sheet for Patients:  EntrepreneurPulse.com.au  Fact Sheet for Healthcare Providers:  IncredibleEmployment.be  This test is no t yet approved or cleared by the [place] FDA and  has been authorized for detection and/or diagnosis of SARS-CoV-2 by FDA under an Emergency Use Authorization (EUA). This EUA will remain  in effect (meaning this test can be used) for the duration of the COVID-19 declaration under Section 564(b)(1) of the Act, 21 U.S.C.section 360bbb-3(b)(1), unless the authorization is terminated  or revoked sooner.       Influenza A by PCR NEGATIVE NEGATIVE Final   Influenza B by PCR NEGATIVE NEGATIVE Final    Comment: (NOTE) The Xpert Xpress SARS-CoV-2/FLU/RSV plus assay is intended as an aid in the diagnosis of influenza from Nasopharyngeal swab specimens and should not be used as a sole basis for treatment. Nasal washings and aspirates are unacceptable for Xpert Xpress SARS-CoV-2/FLU/RSV testing.  Fact Sheet  for Patients: EntrepreneurPulse.com.au  Fact Sheet for Healthcare Providers: IncredibleEmployment.be  This test is not yet approved or cleared by the [place] FDA and has been authorized for detection and/or diagnosis of SARS-CoV-2 by FDA under an Emergency Use Authorization (EUA). This EUA will remain in effect (meaning this test can be used) for the duration of the COVID-19 declaration under Section 564(b)(1) of the Act, 21 U.S.C. section 360bbb-3(b)(1), unless the authorization is terminated  or revoked.  Performed at [facility], [address], [place], [place] [address]   Surgical PCR screen     Status: None   Collection Time: 04/22/21  1:47 AM   Specimen: Nasal Mucosa; Nasal Swab  Result Value Ref Range Status   MRSA, PCR NEGATIVE NEGATIVE Final   Staphylococcus aureus NEGATIVE NEGATIVE Final    Comment: (NOTE) The Xpert SA Assay (FDA approved for NASAL specimens in patients 54 years of age and older), is one component of a comprehensive surveillance program. It is not intended to diagnose infection nor to guide or monitor treatment. Performed at [facility], [address] [place], [place] [address]   Culture, blood (Routine X 2) w Reflex to ID Panel     Status: None (Preliminary result)   Collection Time: 04/27/21  2:05 PM   Specimen: BLOOD  Result Value Ref Range Status   Specimen Description BLOOD  RIGHT [facility]  Final   Special Requests BOTTLES DRAWN AEROBIC AND ANAEROBIC  BCLV  Final   Culture   Final    NO GROWTH < 24 HOURS Performed at [facility], [address], [place], [place] [address]    Report Status PENDING  Incomplete  Culture, blood (Routine X 2) w Reflex to ID Panel     Status: None (Preliminary result)   Collection Time: 04/27/21  2:12 PM   Specimen: BLOOD  Result Value Ref Range Status   Specimen Description BLOOD RIGHT HAND  Final   Special Requests   Final    BOTTLES DRAWN AEROBIC AND ANAEROBIC Blood Culture results may not be optimal due to an inadequate volume of blood received in culture bottles   Culture   Final    NO GROWTH < 24 HOURS Performed at [facility], [address] [place], [place] [address]    Report Status PENDING  Incomplete     Total time spend on discharging this patient, including the last patient exam, discussing the hospital stay, instructions for ongoing care as it relates to all pertinent caregivers, as well as preparing the medical discharge records, prescriptions,  and/or referrals as applicable, is 35 minutes.    Enzo Bi, MD  [facility] 04/29/2021, 9:37 AM

## 2021-04-29 NOTE — Progress Notes (Signed)
   Daily Progress Note   Patient Name: Shelly Padilla       Date: 04/29/2021 DOB: August 05, 1920  Age: 87 y.o. MRN#: [id number] Attending Physician: Enzo Bi, MD Primary Care Physician: Venia Carbon, MD Admit Date: 04/21/2021  Reason for Consultation/Follow-up: Establishing goals of care  Subjective: Chart Reviewed. Updates Received. Patient Assessed.   Patient is ore awake today, confused, alert to self. No acute distress noted. Appetite continues to remain poor with inability to swallow certain pills. Son is at the bedside.   Updates provided.  Patient is scheduled to discharge back to [facility] with hospice support later today.  Son Patent attorney and understanding.  We reviewed goals of care discussion.  He confirms wishes to focus solely on patient's comfort and allow her to spend what time she has left amongst those that she no and care for.  Education provided on symptom management.  RN to administer medication prior to transport to assist with comfort.  Son has a copy of MOST form.  All questions answered and support provided.  Length of Stay: 8 days  Vital Signs: BP (!) 155/107 (BP Location: Left Arm)   Pulse 67   Temp 97.9 F (36.6 C) (Oral)   Resp 18   SpO2 96%  SpO2: SpO2: 96 % O2 Device: O2 Device: Nasal Cannula O2 Flow Rate: O2 Flow Rate (L/min): 3 L/min  Physical Exam: Awake, alert to self, thin, frail chronically ill-appearing RRR Diminished bilaterally Will follow simple commands, confused            Palliative Care Assessment & Plan  HPI: Palliative Care consult requested for goals of care discussion in this 87 y.o. female with a medical history significant for hypertension, TIA, dementia, depression, GERD, hyperlipidemia, atrial fibrillation (Eliquis) duodenitis. Patient presented from [facility] s/p fall with right hip pain.  Chest x-ray negative.  CT of C-spine is negative for acute abnormalities.  CT of head showed questionable SAH. x-ray of the  T-spine and L-spine showed no acute abnormalities.  X-ray of right hip showed acute displaced subcapital right femoral neck fracture. Patient evaluated by orthopedic and is s/p right hip arthroplasty.  Code Status:  DNR  Goals of Care/Recommendations:  All care to focus on comfort.  Son verbalizes wishes for no rehospitalization allow patient to be comfortable with symptom management during what time she has left.  Education provided on symptom management medications.  Patient is scheduled to discharge back to [facility] with outpatient hospice support later today.  RN to administer ordered pain medication to assist with comfort during travel.  Prognosis: < 6 weeks  Discharge Planning: [place] with Hospice  Thank you for allowing the Palliative Medicine Team to assist in the care of this patient.  Time Total: 35 min.   Visit consisted of counseling and education dealing with the complex and emotionally intense issues of symptom management and palliative care in the setting of serious and potentially life-threatening illness.Greater than 50%  of this time was spent counseling and coordinating care related to the above assessment and plan.  Alda Lea, AGPCNP-BC  Palliative Medicine Team [phone]

## 2021-04-29 NOTE — Care Management Important Message (Signed)
Important Message  Patient Details  Name: Shelly Padilla MRN: [id number] Date of Birth: [birth year]   Medicare Important Message Given:  Other (see comment)  Patient is returning [facility] with Hospice Care. Out of respect for the patient and family no Important Message from [facility] given today.  Juliann Pulse A Rutha Melgoza 04/29/2021, 9:53 AM

## 2021-05-01 DIAGNOSIS — F05 Delirium due to known physiological condition: Secondary | ICD-10-CM | POA: Diagnosis not present

## 2021-05-01 DIAGNOSIS — I4819 Other persistent atrial fibrillation: Secondary | ICD-10-CM | POA: Diagnosis not present

## 2021-05-01 DIAGNOSIS — S066X0A Traumatic subarachnoid hemorrhage without loss of consciousness, initial encounter: Secondary | ICD-10-CM

## 2021-05-01 DIAGNOSIS — F39 Unspecified mood [affective] disorder: Secondary | ICD-10-CM | POA: Diagnosis not present

## 2021-05-01 DIAGNOSIS — S72001A Fracture of unspecified part of neck of right femur, initial encounter for closed fracture: Secondary | ICD-10-CM | POA: Diagnosis not present

## 2021-05-02 LAB — CULTURE, BLOOD (ROUTINE X 2)
Culture: NO GROWTH
Culture: NO GROWTH

## 2021-05-14 DEATH — deceased
